# Patient Record
Sex: Male | Born: 1940 | Race: White | Hispanic: No | Marital: Married | Smoking: Never smoker
Health system: Southern US, Community
[De-identification: ages and names within clinical notes are randomized; demographics above are authoritative.]

## PROBLEM LIST (undated history)

## (undated) DIAGNOSIS — I2510 Atherosclerotic heart disease of native coronary artery without angina pectoris: Secondary | ICD-10-CM

## (undated) DIAGNOSIS — F1010 Alcohol abuse, uncomplicated: Secondary | ICD-10-CM

## (undated) DIAGNOSIS — M109 Gout, unspecified: Secondary | ICD-10-CM

## (undated) DIAGNOSIS — I480 Paroxysmal atrial fibrillation: Secondary | ICD-10-CM

## (undated) DIAGNOSIS — R413 Other amnesia: Secondary | ICD-10-CM

## (undated) DIAGNOSIS — F0390 Unspecified dementia without behavioral disturbance: Secondary | ICD-10-CM

## (undated) DIAGNOSIS — E785 Hyperlipidemia, unspecified: Secondary | ICD-10-CM

## (undated) DIAGNOSIS — I10 Essential (primary) hypertension: Secondary | ICD-10-CM

## (undated) DIAGNOSIS — N281 Cyst of kidney, acquired: Secondary | ICD-10-CM

## (undated) HISTORY — DX: Cyst of kidney, acquired: N28.1

## (undated) HISTORY — DX: Hyperlipidemia, unspecified: E78.5

## (undated) HISTORY — DX: Other amnesia: R41.3

## (undated) HISTORY — DX: Paroxysmal atrial fibrillation: I48.0

## (undated) HISTORY — PX: COLONOSCOPY: SHX174

## (undated) HISTORY — DX: Gilbert syndrome: E80.4

## (undated) HISTORY — PX: OTHER SURGICAL HISTORY: SHX169

## (undated) HISTORY — DX: Atherosclerotic heart disease of native coronary artery without angina pectoris: I25.10

## (undated) HISTORY — PX: TONSILLECTOMY: SHX5217

## (undated) HISTORY — DX: Essential (primary) hypertension: I10

---

## 2004-02-08 ENCOUNTER — Encounter: Admission: RE | Admit: 2004-02-08 | Discharge: 2004-02-08 | Payer: Self-pay | Admitting: Family Medicine

## 2004-08-23 ENCOUNTER — Encounter: Admission: RE | Admit: 2004-08-23 | Discharge: 2004-08-23 | Payer: Self-pay | Admitting: Family Medicine

## 2005-02-09 ENCOUNTER — Encounter: Admission: RE | Admit: 2005-02-09 | Discharge: 2005-02-09 | Payer: Self-pay | Admitting: Family Medicine

## 2010-12-31 ENCOUNTER — Encounter: Payer: Self-pay | Admitting: Family Medicine

## 2011-03-12 ENCOUNTER — Ambulatory Visit (INDEPENDENT_AMBULATORY_CARE_PROVIDER_SITE_OTHER): Payer: Medicare PPO | Admitting: Family Medicine

## 2011-03-12 DIAGNOSIS — M109 Gout, unspecified: Secondary | ICD-10-CM

## 2011-03-12 DIAGNOSIS — E7800 Pure hypercholesterolemia, unspecified: Secondary | ICD-10-CM

## 2011-03-19 ENCOUNTER — Institutional Professional Consult (permissible substitution): Payer: Self-pay | Admitting: Family Medicine

## 2011-04-13 ENCOUNTER — Other Ambulatory Visit: Payer: Medicare PPO

## 2011-04-20 ENCOUNTER — Other Ambulatory Visit: Payer: Medicare PPO

## 2011-04-24 ENCOUNTER — Telehealth: Payer: Self-pay | Admitting: Family Medicine

## 2011-04-24 NOTE — Telephone Encounter (Signed)
LAB APPT SCHEDULED-LM

## 2011-04-25 ENCOUNTER — Other Ambulatory Visit: Payer: Medicare PPO | Admitting: Family Medicine

## 2011-04-25 DIAGNOSIS — Z79899 Other long term (current) drug therapy: Secondary | ICD-10-CM

## 2011-04-25 DIAGNOSIS — E789 Disorder of lipoprotein metabolism, unspecified: Secondary | ICD-10-CM

## 2011-04-25 LAB — URIC ACID: Uric Acid, Serum: 5.6 mg/dL (ref 4.0–7.8)

## 2011-04-25 LAB — COMPREHENSIVE METABOLIC PANEL
ALT: 13 U/L (ref 0–53)
AST: 20 U/L (ref 0–37)
Alkaline Phosphatase: 63 U/L (ref 39–117)
BUN: 14 mg/dL (ref 6–23)
Calcium: 9.4 mg/dL (ref 8.4–10.5)
Chloride: 102 mEq/L (ref 96–112)
Creat: 1.15 mg/dL (ref 0.40–1.50)
Potassium: 3.5 mEq/L (ref 3.5–5.3)

## 2011-04-25 LAB — LIPID PANEL
HDL: 47 mg/dL (ref >39)
LDL Cholesterol: 94 mg/dL (ref 0–99)
Total CHOL/HDL Ratio: 3.3 Ratio

## 2011-04-26 ENCOUNTER — Telehealth: Payer: Self-pay | Admitting: *Deleted

## 2011-04-26 NOTE — Telephone Encounter (Signed)
Left message for patient to return my call [email or web address]

## 2011-04-30 ENCOUNTER — Telehealth: Payer: Self-pay | Admitting: Family Medicine

## 2011-04-30 NOTE — Telephone Encounter (Signed)
Pt in [place], advised of labs and rx info.  Pt will come in 3 months for labs LFT & FLP per Dr. Lynelle Doctor.  Will call in rx Uloric & Lipitor to [facility] on line [facility]

## 2011-04-30 NOTE — Telephone Encounter (Signed)
I spoke with pt advised of labs.  He wants his meds sent to [facility] on line  Lipitor 40 mg qd  3 rf   And Uloric 40mg  qd 6 rf

## 2011-04-30 NOTE — Telephone Encounter (Signed)
Message copied by Laureen Ochs on Mon Apr 30, 2011 12:16 PM ------      Message from: KNAPP, EVE      Created: Thu Apr 26, 2011  4:44 PM       Advise pt that LDL is higher than when he was on Vytorin.  Goal LDL for pt with known CAD is <70, and is now 94.  I recommend increasing Lipitor to 40mg  daily.  Re-check LFT and FLP in 3 months (272.0, v58.69).  #30 with 3 refills. Advise C-met normal, and uric acid now WNL.  Continue Uloric at 40mg , and okay to r/f x 6 months.  If he has add'l questions, can schedule OV, otherwise f/u after labs in 3 months

## 2011-05-01 NOTE — Telephone Encounter (Signed)
Refills faxed Lipitor & Uloric to [facility]

## 2011-07-03 ENCOUNTER — Telehealth: Payer: Self-pay | Admitting: Family Medicine

## 2011-07-03 MED ORDER — ATORVASTATIN CALCIUM 20 MG PO TABS: 20.0000 mg | ORAL_TABLET | Freq: Every day | ORAL | Status: DC

## 2011-07-03 MED ORDER — LISINOPRIL-HYDROCHLOROTHIAZIDE 20-12.5 MG PO TABS: 2.0000 | ORAL_TABLET | Freq: Every day | ORAL | Status: DC

## 2011-07-03 NOTE — Telephone Encounter (Signed)
Go ahead and give him a  one month supply of these 2 medications.

## 2011-07-13 ENCOUNTER — Telehealth: Payer: Self-pay | Admitting: Family Medicine

## 2011-07-13 NOTE — Telephone Encounter (Signed)
Pharmacy was called and rx was called in for Lisinopril #180 with 1 refill.  CM, LPN

## 2011-07-16 NOTE — Telephone Encounter (Signed)
Cora handled

## 2011-07-20 ENCOUNTER — Other Ambulatory Visit: Payer: Medicare PPO

## 2011-07-20 DIAGNOSIS — Z79899 Other long term (current) drug therapy: Secondary | ICD-10-CM

## 2011-07-20 DIAGNOSIS — E7800 Pure hypercholesterolemia, unspecified: Secondary | ICD-10-CM

## 2011-07-20 LAB — LIPID PANEL
Cholesterol: 143 mg/dL (ref 0–200)
HDL: 54 mg/dL (ref >39)
Total CHOL/HDL Ratio: 2.6 Ratio
Triglycerides: 58 mg/dL (ref <150)

## 2011-07-20 LAB — HEPATIC FUNCTION PANEL
Albumin: 4.1 g/dL (ref 3.5–5.2)
Alkaline Phosphatase: 54 U/L (ref 39–117)
Total Bilirubin: 1 mg/dL (ref 0.3–1.2)
Total Protein: 6.3 g/dL (ref 6.0–8.3)

## 2011-07-26 ENCOUNTER — Encounter: Payer: Self-pay | Admitting: Family Medicine

## 2011-07-30 ENCOUNTER — Encounter: Payer: Self-pay | Admitting: Family Medicine

## 2011-07-30 ENCOUNTER — Ambulatory Visit (INDEPENDENT_AMBULATORY_CARE_PROVIDER_SITE_OTHER): Payer: Medicare PPO | Admitting: Family Medicine

## 2011-07-30 VITALS — BP 134/72 | HR 68 | Ht 71.0 in | Wt 168.0 lb

## 2011-07-30 DIAGNOSIS — I10 Essential (primary) hypertension: Secondary | ICD-10-CM

## 2011-07-30 DIAGNOSIS — E7800 Pure hypercholesterolemia, unspecified: Secondary | ICD-10-CM | POA: Insufficient documentation

## 2011-07-30 DIAGNOSIS — Z79899 Other long term (current) drug therapy: Secondary | ICD-10-CM

## 2011-07-30 DIAGNOSIS — I2510 Atherosclerotic heart disease of native coronary artery without angina pectoris: Secondary | ICD-10-CM

## 2011-07-30 DIAGNOSIS — M109 Gout, unspecified: Secondary | ICD-10-CM

## 2011-07-30 LAB — BASIC METABOLIC PANEL
BUN: 15 mg/dL (ref 6–23)
CO2: 27 mEq/L (ref 19–32)
Calcium: 9.4 mg/dL (ref 8.4–10.5)
Creat: 1.1 mg/dL (ref 0.50–1.35)
Glucose, Bld: 112 mg/dL — ABNORMAL HIGH (ref 70–99)

## 2011-07-30 LAB — URIC ACID: Uric Acid, Serum: 4.4 mg/dL (ref 4.0–7.8)

## 2011-07-30 MED ORDER — ATORVASTATIN CALCIUM 40 MG PO TABS: 40.0000 mg | ORAL_TABLET | Freq: Every day | ORAL | Status: DC

## 2011-07-30 NOTE — Patient Instructions (Signed)
I do not recommend changing to the alternative medication you brought info about; however, if you would like to try this, we need to check a uric acid level about a month after you change.  If you have a gout flare while taking it, then we don't need to check the uric acid level, just resume the uloric once the flare has improved.

## 2011-07-30 NOTE — Progress Notes (Signed)
Patient presents for f/u labs.  Unfortunately, only lipids and LFT's were ordered, when, per notes from last visit, he was supposed to have c-met, lipids and uric acid level  Hyperlipidemia follow-up:  Patient is reportedly following a low-fat, low cholesterol diet.  Eating better than he ever has.  Compliant with medications and denies medication side effects--tolerated increase of Lipitor dose from 20 to 40mg  without problems.  Gout--No problems with gout flares in his feet since being on the uloric.  Had some knee pain and swelling a week or two ago, improving now.  Gave up beer over the last week, and improving.  Tolerating the Uloric without side effects.  Brings in info on [place], considering taking this in place of Uloric--he thinks it sounds like there may be more overall benefit to it.  Cost is about the same.  HTN--BP at home runs 130's/60's (low-mid 130's).  Was out of lisinopril for about 10 days, BP went up into the 140's.  Has been back on lisinopril for about 10 days.  Past Medical History  Diagnosis Date  . CAD (coronary artery disease)     Dr. Eldridge Dace.  atherectomy from RCA and LAD '91  . HTN (hypertension)   . Renal cyst     left  . Gilbert syndrome     elevated indirect bilirubin  . Wheezing     with beta blockers (per Dr. Lindell Spar notes)  . Inguinal hernia     R>L, easily reducible  . Wheezing     WITH B-BLOCKERS   Past Surgical History  Procedure Date  . Tonsillectomy age 77  . Cardiac atherectomy '91    to LAD and RCA  . Prk '96    (refractive eye surgery)  . Colonoscopy 03/2009   History   Social History  . Marital Status: Married    Spouse Name: N/A    Number of Children: N/A  . Years of Education: N/A   Occupational History  . Not on file.   Social History Main Topics  . Smoking status: Never Smoker   . Smokeless tobacco: Not on file  . Alcohol Use: Yes     on occassion; occasional glass of wine with dinner, bourbon and ginger ale 2x/week.   a beer at baseball game in the summer  . Drug Use: No  . Sexually Active: Not on file   Other Topics Concern  . Not on file   Social History Narrative  . No narrative on file   ROS:  Denies headaches, chest pain, dizziness, palpitations, edema.  See HPI.  Denies GI complaints, skin concerns or other problems  PHYSICAL EXAM: BP 134/72  Pulse 68  Ht 5\' 11"  (1.803 m)  Wt 168 lb (76.204 kg)  BMI 23.43 kg/m2 Well developed, pleasant male in no distress Neck: no lymphadenopathy or thyromegaly Heart: regular rate and rhythm without murmur Lungs: clear bilaterally Abdomen: soft, no organomegaly or mass, nontender Extremities: no clubbing, cyanosis or edema. 2+ pulses Skin: without rashes or lesions Psych: normal mood, affect, hygiene, grooming, speech and eye contact  ASSESSMENT/PLAN: 1. Pure hypercholesterolemia    2. Gout  Uric acid  3. CAD (coronary artery disease)    4. Encounter for long-term (current) use of other medications  Basic metabolic panel   Lipids:  Still not quite at goal of LDL<70, but improved.  Continue low cholesterol diet. Change from 20mg  BID of Lipitor to the 40mg  tablet HTN: adequately controlled per home numbers Gout: Consider changing to 40  mg of lisinopril (and getting rid of the HCTZ) if uric acid is elevated on today's labs (although would need to monitor BP closely, might need additional BP meds that won't increase uric acid level).  I do not recommend him changing from Uloric to OTC medication, however if he wants to, then he needs to have repeat uric acid levels in a month. If he has a recurrent gout flare prior to repeating levels, then he should treat acute flare, then resume Uloric (no need to repeat uric acid)  F/u 6 months, sooner prn.  Labs prior

## 2011-07-31 ENCOUNTER — Encounter: Payer: Self-pay | Admitting: Family Medicine

## 2011-10-15 ENCOUNTER — Other Ambulatory Visit: Payer: Self-pay | Admitting: Family Medicine

## 2011-10-15 MED ORDER — FEBUXOSTAT 40 MG PO TABS: 40.0000 mg | ORAL_TABLET | Freq: Every day | ORAL | Status: DC

## 2011-10-15 NOTE — Telephone Encounter (Signed)
I don't believe we can [facility]rx to [facility].  Please phone in (or fax) refill as ordered

## 2011-12-10 ENCOUNTER — Telehealth: Payer: Self-pay | Admitting: *Deleted

## 2011-12-10 DIAGNOSIS — M2550 Pain in unspecified joint: Secondary | ICD-10-CM

## 2011-12-10 NOTE — Telephone Encounter (Signed)
I added sed rate to future orders (already had uric acid level for his known gout ordered).  Clearly, he doesn't need to wait until then if having ongoing pain.

## 2011-12-10 NOTE — Telephone Encounter (Signed)
Patient called and stated that he has been dealing with an elbow "flare up." Patient stated that his elbow feels "spongy' and he is having muscle pain up toward shoulder x 3 weeks. Been taking ibuprofen for the pain and is dealing with it. I offered him and appt and he simply wanted me to ask you if you could add some labs do his labwork already scheduled for 01/21/12. He is requesting labs be added for inflammation. Thanks.

## 2011-12-13 ENCOUNTER — Telehealth: Payer: Self-pay | Admitting: *Deleted

## 2011-12-13 NOTE — Telephone Encounter (Signed)
Spoke with patient to let him know that sedrate was added to labs. Also let him know that he should come in sooner if ongoing pain continues. He said that he has pretty well conquered the pain by taking naproxen and he will call if anything worsens.

## 2012-01-15 ENCOUNTER — Encounter: Payer: Self-pay | Admitting: Internal Medicine

## 2012-01-21 ENCOUNTER — Other Ambulatory Visit: Payer: Medicare Other

## 2012-01-21 DIAGNOSIS — Z79899 Other long term (current) drug therapy: Secondary | ICD-10-CM

## 2012-01-21 DIAGNOSIS — E7800 Pure hypercholesterolemia, unspecified: Secondary | ICD-10-CM

## 2012-01-21 DIAGNOSIS — M2550 Pain in unspecified joint: Secondary | ICD-10-CM

## 2012-01-21 DIAGNOSIS — I10 Essential (primary) hypertension: Secondary | ICD-10-CM

## 2012-01-21 LAB — LIPID PANEL
LDL Cholesterol: 97 mg/dL (ref 0–99)
Triglycerides: 123 mg/dL (ref <150)
VLDL: 25 mg/dL (ref 0–40)

## 2012-01-21 LAB — COMPREHENSIVE METABOLIC PANEL
ALT: 20 U/L (ref 0–53)
AST: 25 U/L (ref 0–37)
CO2: 31 mEq/L (ref 19–32)
Calcium: 9.5 mg/dL (ref 8.4–10.5)
Chloride: 100 mEq/L (ref 96–112)
Creat: 1.24 mg/dL (ref 0.50–1.35)
Sodium: 139 mEq/L (ref 135–145)
Total Protein: 6.7 g/dL (ref 6.0–8.3)

## 2012-01-28 ENCOUNTER — Encounter: Payer: Self-pay | Admitting: Family Medicine

## 2012-01-28 ENCOUNTER — Ambulatory Visit (INDEPENDENT_AMBULATORY_CARE_PROVIDER_SITE_OTHER): Payer: Medicare Other | Admitting: Family Medicine

## 2012-01-28 VITALS — BP 120/70 | HR 72 | Ht 71.0 in | Wt 174.0 lb

## 2012-01-28 DIAGNOSIS — M109 Gout, unspecified: Secondary | ICD-10-CM

## 2012-01-28 DIAGNOSIS — M7021 Olecranon bursitis, right elbow: Secondary | ICD-10-CM

## 2012-01-28 DIAGNOSIS — E7800 Pure hypercholesterolemia, unspecified: Secondary | ICD-10-CM

## 2012-01-28 DIAGNOSIS — M7020 Olecranon bursitis, unspecified elbow: Secondary | ICD-10-CM

## 2012-01-28 DIAGNOSIS — I10 Essential (primary) hypertension: Secondary | ICD-10-CM

## 2012-01-28 DIAGNOSIS — I2510 Atherosclerotic heart disease of native coronary artery without angina pectoris: Secondary | ICD-10-CM

## 2012-01-28 NOTE — Progress Notes (Signed)
Patient presents for med check.  Had labs done prior to his appointment.  Hyperlipidemia follow-up:  Patient is reportedly following a low-fat, low cholesterol diet.  Denies any change in diet from last visit.  Compliant with medications and denies medication side effects.  Previously had done quite well with Vytorin, but changed due to cost of medication ($267 for 3 months); atorvastatin is only $5/month. Denies myalgias.    Hypertension follow-up:  Blood pressures elsewhere are high 120's-130/63-80.  Denies dizziness, headaches, chest pain, palpitations, edema.  Denies side effects of medications.  CAD:  Followed by Dr. Eldridge Dace.  Stable and doing well.  He is able to run 2 miles without any problems.  Previously didn't tolerate beta blockers (thinks his feet got cold).  Last stress test was within the year, and was normal.  Sees him again in August.  Gout follow-up: Denies any gout flares since last visit.  Remains on the Uloric without side effects.  Eats 5 gin-soaked yellow raisins daily, as he has heard this is good for helping with gout.  Noticed swelling at right elbow about a month or two ago.  Fluid has improved, but still hasn't completely resolved.  Denies any pain at the elbow.  Past Medical History  Diagnosis Date  . CAD (coronary artery disease)     Dr. Eldridge Dace.  atherectomy from RCA and LAD '91  . HTN (hypertension)   . Renal cyst     left  . Gilbert syndrome     elevated indirect bilirubin  . Wheezing     with beta blockers (per Dr. Lindell Spar notes)  . Inguinal hernia     R>L, easily reducible  . Wheezing     WITH B-BLOCKERS    Past Surgical History  Procedure Date  . Tonsillectomy age 61  . Cardiac atherectomy '91    to LAD and RCA  . Prk '96    (refractive eye surgery)  . Colonoscopy 03/2009    History   Social History  . Marital Status: Married    Spouse Name: N/A    Number of Children: N/A  . Years of Education: N/A   Occupational History  . Not on  file.   Social History Main Topics  . Smoking status: Never Smoker   . Smokeless tobacco: Not on file  . Alcohol Use: Yes     on occassion; occasional glass of wine with dinner, bourbon and ginger ale 2x/week.  a beer at baseball game in the summer  . Drug Use: No  . Sexually Active: Not on file   Other Topics Concern  . Not on file   Social History Narrative  . No narrative on file     Current outpatient prescriptions:atorvastatin (LIPITOR) 40 MG tablet, Take 1 tablet (40 mg total) by mouth daily., Disp: 30 tablet, Rfl: 5;  Calcium Carbonate-Vit D-Min (CALCIUM 600+D PLUS MINERALS) 600-400 MG-UNIT TABS, Take 1 tablet by mouth daily.  , Disp: , Rfl: ;  Cholecalciferol (VITAMIN D) 2000 UNITS tablet, Take 2,000 Units by mouth daily.  , Disp: , Rfl: ;  febuxostat (ULORIC) 40 MG tablet, Take 40 mg by mouth daily., Disp: , Rfl:  fish oil-omega-3 fatty acids 1000 MG capsule, Take 1 g by mouth daily.  , Disp: , Rfl: ;  Glucosamine 500 MG CAPS, Take 3 capsules by mouth daily.  , Disp: , Rfl: ;  lisinopril-hydrochlorothiazide (PRINZIDE,ZESTORETIC) 20-12.5 MG per tablet, Take 2 tablets by mouth daily., Disp: 180 tablet, Rfl: 1;  Misc  Natural Products (TART CHERRY ADVANCED PO), Take 1 tablet by mouth daily., Disp: , Rfl:  Multiple Vitamins-Minerals (CENTRUM SILVER PO), Take 1 tablet by mouth daily.  , Disp: , Rfl: ;  saw palmetto 160 MG capsule, Take 160 mg by mouth daily. , Disp: , Rfl: ;  selenium 50 MCG TABS, Take 50 mcg by mouth daily.  , Disp: , Rfl: ;  Zinc Sulfate (ZINC 15 PO), Take 1 tablet by mouth daily.  , Disp: , Rfl: ;  DISCONTD: atorvastatin (LIPITOR) 20 MG tablet, Take 1 tablet (20 mg total) by mouth daily., Disp: 90 tablet, Rfl: 2  No Known Allergies  ROS:  Denies fevers, URI symptoms, cough, shortness of breath, chest pain, edema, skin rashes or lesions.  +R elbow swelling as per HPI.  Denies any urinary complaints or GI complaints. Moods are good  PHYSICAL EXAM: BP 120/70  Pulse 72   Ht 5\' 11"  (1.803 m)  Wt 174 lb (78.926 kg)  BMI 24.27 kg/m2 Well developed, pleasant male in no distress  Neck: no lymphadenopathy or thyromegaly or carotid bruit Heart: regular rate and rhythm without murmur  Lungs: clear bilaterally  Abdomen: soft, no organomegaly or mass, nontender  Extremities: no clubbing, cyanosis or edema. 2+ pulses  Skin: without rashes or lesions  Psych: normal mood, affect, hygiene, grooming, speech and eye contact    Chemistry      Component Value Date/Time   NA 139 01/21/2012 0825   K 3.7 01/21/2012 0825   CL 100 01/21/2012 0825   CO2 31 01/21/2012 0825   BUN 14 01/21/2012 0825   CREATININE 1.24 01/21/2012 0825      Component Value Date/Time   CALCIUM 9.5 01/21/2012 0825   ALKPHOS 54 01/21/2012 0825   AST 25 01/21/2012 0825   ALT 20 01/21/2012 0825   BILITOT 1.1 01/21/2012 0825     Lab Results  Component Value Date   CHOL 175 01/21/2012   CHOL 143 07/20/2011   CHOL 154 04/25/2011   Lab Results  Component Value Date   HDL 53 01/21/2012   HDL 54 1/61/0960   HDL 47 4/54/0981   Lab Results  Component Value Date   LDLCALC 97 01/21/2012   LDLCALC 77 07/20/2011   LDLCALC 94 04/25/2011   Lab Results  Component Value Date   TRIG 123 01/21/2012   TRIG 58 07/20/2011   TRIG 67 04/25/2011   Lab Results  Component Value Date   CHOLHDL 3.3 01/21/2012   CHOLHDL 2.6 07/20/2011   CHOLHDL 3.3 04/25/2011   No results found for this basename: LDLDIRECT   Uric acid 5.5  ASSESSMENT/PLAN: 1. Pure hypercholesterolemia    higher than previous.  Not at goal of LDL<70  2. Essential hypertension, benign    controlled  3. Gout    controlled  4. CAD (coronary artery disease)    stable  5. Olecranon bursitis of right elbow    improved, some residual swelling    Hyperlipidemia--Increase Lipitor to 80 mg (will double check that bottle at home is in fact 40mg , and call)  Gout--well controlled.  Should have a 90 day refill left at pharmacy.  Discussed possibility of  changing HCTZ and stopping uloric versus continuing current regimen which is working well.  Continue current regimen   F/u with fasting lab visit in 3 months to recheck lipids, lft's. 6 months--CPE/med check

## 2012-01-28 NOTE — Patient Instructions (Signed)
Please double check your atorvastatin bottle at home--if for some reason it is only 20mg , then increase back to 40 mg (and call for refill of 40mg  tablet).  If you really are taking the 40mg , then we need to increase to 80mg  (and call requesting rx for 80mg ).  Olecranon bursitis--very mild, no need for any treatment.  If it should become more swollen and painful, anti-inflammatories (ie advil) are helpful   Bursitis, Olecranon Bursitis is a swelling and soreness (inflammation) of a fluid-filled sac (bursa) that overlies and protects a joint. Yours is over the elbow and the name for this is Olecranon bursitis. Other common places to get this are the shoulders, hips and knees. CAUSES Bursitis can be caused by injury, overuse of the joint, arthritis, or infection.  SYMPTOMS   Tenderness, swelling, warmth, or redness over the elbow.   Pain with moving the elbow. This is greater with bending the elbow.   There may be a squeaking when the bursa is rubbed or moved.   The bursa may increase in size without pain or discomfort.   You may develop a fever with increasing pain and swelling if the bursa becomes infected.  HOME CARE INSTRUCTIONS   Apply ice to the affected area for 15 to 20 minutes each hour while awake for 2 days. Put the ice in a plastic bag and place a towel between the bag of ice and your skin.   When resting, elevate your elbow above the level of your heart. This helps reduce swelling.   Rest the injured joint as much as possible. Continue to put the joint through a full range of motion 4 times per day. When the pain lessens, begin normal slow movements and usual activities.   Only take over-the-counter or prescription medicines for pain, discomfort, or fever as directed by your caregiver.   If conservative treatment does not work, your caregiver may decide to drain the bursa and inject cortisone. This speeds up the healing process.   Reduce your intake of milk and related  dairy products. They may make your condition worse.  SEEK IMMEDIATE MEDICAL CARE IF:   Your pain increases even during treatment.   You develop an oral temperature above 102 F (38.9 C).   You have heat and inflammation over the involved bursa and elbow.   There is a red line that goes up your arm.   You develop pain with movement of your elbow.  MAKE SURE YOU:   Understand these instructions.   Will watch your condition.   Will get help right away if you are not doing well or get worse.  Document Released: 12/26/2006 Document Revised: 08/08/2011 Document Reviewed: 11/11/2007 [facility] Patient Information 2012 [place], [place].

## 2012-02-28 ENCOUNTER — Other Ambulatory Visit: Payer: Self-pay | Admitting: Internal Medicine

## 2012-02-28 DIAGNOSIS — E7800 Pure hypercholesterolemia, unspecified: Secondary | ICD-10-CM

## 2012-02-28 DIAGNOSIS — I10 Essential (primary) hypertension: Secondary | ICD-10-CM

## 2012-02-28 MED ORDER — LISINOPRIL-HYDROCHLOROTHIAZIDE 20-12.5 MG PO TABS: 2.0000 | ORAL_TABLET | Freq: Every day | ORAL | Status: DC

## 2012-02-28 MED ORDER — FEBUXOSTAT 40 MG PO TABS: 40.0000 mg | ORAL_TABLET | Freq: Every day | ORAL | Status: DC

## 2012-02-28 NOTE — Telephone Encounter (Signed)
Advise pt I sent rx's to [facility] on [address]. Please send Vytorin Rx--supposed to go to [facility]'s online pharmacy--is that [facility]?? (that's where we previously sent rx's?) I recommend returning for lab only visit for lft's and lipids in 2-3 months after changing to Vytorin to make sure we are meeting goals (LDL<70) with this medication.

## 2012-02-29 ENCOUNTER — Telehealth: Payer: Self-pay | Admitting: Family Medicine

## 2012-02-29 NOTE — Telephone Encounter (Signed)
Patient wants Vytorin Rx (90 day supply) sent to mail order pharmacy    [facility]

## 2012-03-03 ENCOUNTER — Other Ambulatory Visit: Payer: Self-pay | Admitting: *Deleted

## 2012-03-03 ENCOUNTER — Telehealth: Payer: Self-pay | Admitting: *Deleted

## 2012-03-03 DIAGNOSIS — I10 Essential (primary) hypertension: Secondary | ICD-10-CM

## 2012-03-03 MED ORDER — EZETIMIBE-SIMVASTATIN 10-80 MG PO TABS: 1.0000 | ORAL_TABLET | Freq: Every day | ORAL | Status: DC

## 2012-03-03 MED ORDER — LISINOPRIL-HYDROCHLOROTHIAZIDE 20-12.5 MG PO TABS: 2.0000 | ORAL_TABLET | Freq: Every day | ORAL | Status: DC

## 2012-03-03 NOTE — Telephone Encounter (Signed)
This says that the lisinopril HCT was PRINT--not normal.  Not sure if I missed that when I quickly did it last week. Please call in to [facility] if it didn't go through.  Thanks

## 2012-03-03 NOTE — Telephone Encounter (Signed)
Spoke with patient and sent rx for Vytorin 80mg  to [facility].

## 2012-03-10 ENCOUNTER — Telehealth: Payer: Self-pay | Admitting: Family Medicine

## 2012-03-10 NOTE — Telephone Encounter (Signed)
Patient called and requested a call from you. I called him to see if I could help. He stated that within the last week he has experienced some lightheadedness that did not last very long or cause him any impairment. His bp readings over the weekend have been about 163/83, he states he is trying to be proactive. He said he is scheduled for his yearly appt with Dr Everette Rank in May. Do you think he should try to get in sooner?

## 2012-03-10 NOTE — Telephone Encounter (Signed)
Felt a little winded while doing yardwork.  Didn't last long.  Started checking BP's more often, running a little high (160's/80's, pulse 70's). Denies dizziness, chest pain, allergy symptoms, although patient hadn't really been paying attention, and thinks maybe allergies could be a factor. Had a few episodes, all of which were very short-lived.  He will try taking Claritin, paying attention as to whether or not there are any allergy symptoms.  Continue to monitor BP, and if persistently elevated, will need meds adjusted.  We discussed that the most concerning thing for exertional dyspnea would be heart disease.  He has a h/o CAD and is followed by Dr. Eldridge Dace.  His next appt is late May.  Advised to return here if BP's remain elevated, or ongoing symptoms, and that he may need to f/u with Dr. Seth Bake sooner than his scheduled appt.  All of his questions/concerns were answered.

## 2012-03-10 NOTE — Telephone Encounter (Signed)
Pt called requesting call from Dr. Lynelle Doctor to discuss blood pressure questions

## 2012-03-12 ENCOUNTER — Telehealth: Payer: Self-pay | Admitting: *Deleted

## 2012-03-12 NOTE — Telephone Encounter (Signed)
I recommend changing him to plain lisinopril 20 mg (not the 20-12.5 with HCTZ).  This is cutting out the diuretic which raises the uric acid levels.  Have him monitor his blood pressure at home, and if BPs get to be >140/90, then will need to raise the lisinopril dose.  Since his BP's have been good, he might do just fine without the HCTZ.  He should return in 3 months for fasting lipids and LFT's since we are restarting him back on Vytorin, and also check a uric acid at that time.  (if we end up increasing his lisinopril dose, then will also need b-met done then).  F/u in 3 months after labs, sooner prn.  Obviously, if he has gout flare in those 3 months, then we will need to start him on allopurinol and do labs sooner.

## 2012-03-12 NOTE — Telephone Encounter (Signed)
Error

## 2012-03-12 NOTE — Telephone Encounter (Signed)
Spoke with patient and he liked the idea of changing his blood pressure medication (by taking hctz away) and stopping the Uloric completely. What do we need to do to get this started? Appt to change med?

## 2012-03-13 ENCOUNTER — Other Ambulatory Visit: Payer: Self-pay | Admitting: *Deleted

## 2012-03-13 ENCOUNTER — Telehealth: Payer: Self-pay | Admitting: Family Medicine

## 2012-03-13 DIAGNOSIS — I10 Essential (primary) hypertension: Secondary | ICD-10-CM

## 2012-03-13 DIAGNOSIS — E7800 Pure hypercholesterolemia, unspecified: Secondary | ICD-10-CM

## 2012-03-13 DIAGNOSIS — Z79899 Other long term (current) drug therapy: Secondary | ICD-10-CM

## 2012-03-13 MED ORDER — LISINOPRIL 20 MG PO TABS: 20.0000 mg | ORAL_TABLET | Freq: Every day | ORAL | Status: DC

## 2012-03-13 NOTE — Telephone Encounter (Signed)
Spoke with patient and he will begin taking lisinopril 20mg  daily(called into [facility]) he will d/c Uloric and monitor his bp readings, if his numbers rise above 140/90 he is to call and Dr.Knapp will increase his lisinopril dose. He is scheduled for 06/13/12 @ 8:30 for lipids, LFT's and uric acid-orders in system.

## 2012-03-13 NOTE — Telephone Encounter (Signed)
Called patient back twice since he last called me and left him a message both times. If he doesn't call me back today by 5pm, I will call him first thing Monday am.

## 2012-04-08 ENCOUNTER — Telehealth: Payer: Self-pay | Admitting: Family Medicine

## 2012-04-08 NOTE — Telephone Encounter (Signed)
LM

## 2012-04-28 ENCOUNTER — Other Ambulatory Visit: Payer: Medicare Other

## 2012-04-28 DIAGNOSIS — E7800 Pure hypercholesterolemia, unspecified: Secondary | ICD-10-CM

## 2012-04-28 DIAGNOSIS — Z79899 Other long term (current) drug therapy: Secondary | ICD-10-CM

## 2012-04-28 LAB — LIPID PANEL
HDL: 62 mg/dL (ref >39)
LDL Cholesterol: 45 mg/dL (ref 0–99)
VLDL: 11 mg/dL (ref 0–40)

## 2012-04-28 LAB — HEPATIC FUNCTION PANEL
Albumin: 4 g/dL (ref 3.5–5.2)
Alkaline Phosphatase: 46 U/L (ref 39–117)
Total Bilirubin: 1.2 mg/dL (ref 0.3–1.2)

## 2012-04-28 LAB — URIC ACID: Uric Acid, Serum: 4.1 mg/dL (ref 4.0–7.8)

## 2012-05-06 ENCOUNTER — Telehealth: Payer: Self-pay | Admitting: Family Medicine

## 2012-05-06 NOTE — Telephone Encounter (Signed)
Please call patient and see what his blood pressures are running.  He is supposed to be on 40mg  of HCTZ (taking TWO 20mg  tablets daily)--it doesn't look like rx was done correctly--states only 1 qd (I believe that he previously took TWO of the 20/12.5 of the lisinopril HCT prior to changing).  If his BP's are running high, and he is only taking 20mg , then increase to 40mg  and monitor BP. If he has been taking 40mg  of lisinopril, and BP's are running high, then go back to the previous med (2 tabs of 20/12.5), and continue on Uloric, as this regimen has worked well for him

## 2012-05-07 ENCOUNTER — Other Ambulatory Visit: Payer: Self-pay | Admitting: *Deleted

## 2012-05-07 DIAGNOSIS — I10 Essential (primary) hypertension: Secondary | ICD-10-CM

## 2012-05-07 MED ORDER — LISINOPRIL 20 MG PO TABS: 40.0000 mg | ORAL_TABLET | Freq: Every day | ORAL | Status: DC

## 2012-05-07 NOTE — Telephone Encounter (Signed)
Spoke with patient and he has only been taking 20mg  of plain lisinopril with his numbers ranging from the 190's/93- 150/88. He will begin taking 40mg  plain lisinopril and I did call into [facility] for him. He will monitor bp numbers and call us if numbers are not within normal range and Dr.Knapp will switch him back to his original regimen of lisinopril hct and Uloric as this worked well for him.

## 2012-05-26 ENCOUNTER — Telehealth: Payer: Self-pay | Admitting: *Deleted

## 2012-05-26 NOTE — Telephone Encounter (Signed)
Patient called and stated that since he started taking the TWO 20mg  lisinopril his bp's have been ranging in the 150's/80's. In addition over the past few weeks he has been experiencing some short periods of SOB. Had again this am-he took 2 aspirin with some water and after 5 minutes it subsided. I offered him appt today but he stated that he wanted to send a message back to you first asking what you recommend. Thanks.

## 2012-05-26 NOTE — Telephone Encounter (Signed)
Spoke with patient and he will be coming in [facility] 05/28/12 @ 8:30am.

## 2012-05-26 NOTE — Telephone Encounter (Signed)
OV is recommended to f/u on his BP (preferably after he's been on the 40mg  of lisinopril for at least 2 weeks). Can be seen sooner for SOB (even if <2 weeks on higher meds).  Agree with recommendation for OV

## 2012-05-28 ENCOUNTER — Encounter: Payer: Self-pay | Admitting: Family Medicine

## 2012-05-28 ENCOUNTER — Ambulatory Visit (INDEPENDENT_AMBULATORY_CARE_PROVIDER_SITE_OTHER): Payer: Medicare Other | Admitting: Family Medicine

## 2012-05-28 VITALS — BP 142/64 | HR 80 | Ht 71.0 in | Wt 172.0 lb

## 2012-05-28 DIAGNOSIS — I2510 Atherosclerotic heart disease of native coronary artery without angina pectoris: Secondary | ICD-10-CM

## 2012-05-28 DIAGNOSIS — I10 Essential (primary) hypertension: Secondary | ICD-10-CM

## 2012-05-28 DIAGNOSIS — E7800 Pure hypercholesterolemia, unspecified: Secondary | ICD-10-CM

## 2012-05-28 DIAGNOSIS — M109 Gout, unspecified: Secondary | ICD-10-CM

## 2012-05-28 MED ORDER — LISINOPRIL-HYDROCHLOROTHIAZIDE 20-12.5 MG PO TABS: 2.0000 | ORAL_TABLET | Freq: Every day | ORAL | Status: DC

## 2012-05-28 NOTE — Patient Instructions (Signed)
Stop the plain lisinopril.  Go back to the lisinopril HCTZ combination.  Continue the Uloric.  Low purine diet is recommended (lowers uric acid to decrease gout flares).  Talk with Dr. Eldridge Dace about ?need for a beta blocker (other than atenolol which you didn't tolerate).

## 2012-05-28 NOTE — Progress Notes (Signed)
Chief Complaint  Patient presents with  . Follow-up    bp follow up.   HPI:  HTN:  BP's at home have been running 140-170/70-90 at home on current regimen of lisinopril 40mg  tablet.  He was changed from lisinopril HCT 20/12.5 2 tablets daily to plain lisinopril due to trying to lower uric acid (so that uloric could be eliminated if uric acid levels were low enough).  However, he has noted that BP's haven't been well controlled on this current regimen, and he also reports that he never stopped the Uloric.  Monday--while walking at work (not strenuous) he developed some shortness of breath and R shoulder pain.  He took 2 baby aspirin and chewed them, and symptoms resolved.  He did not rest, continued walking/working.  He has been doing a lot of vigorous yardwork.  He had no dyspnea with this strenuous activity.  He had called here, as well as called Dr. Eldridge Dace, and he is set up for a stress test on Friday. Previously intolerant of beta blocker (atenolol) due to it causing his feet to feel very cold. Also reportedly had wheezing (per chart) on beta blockers.  Hyperlipidemia:  Back on Vytorin, and last labs showed it effective Lab Results  Component Value Date   CHOL 118 04/28/2012   HDL 62 04/28/2012   LDLCALC 45 04/28/2012   TRIG 56 04/28/2012   CHOLHDL 1.9 04/28/2012   Past Medical History  Diagnosis Date  . CAD (coronary artery disease)     Dr. Eldridge Dace.  atherectomy from RCA and LAD '91  . HTN (hypertension)   . Renal cyst     left  . Gilbert syndrome     elevated indirect bilirubin  . Wheezing     with beta blockers (per Dr. Lindell Spar notes)  . Inguinal hernia     R>L, easily reducible  . Wheezing     WITH B-BLOCKERS   Past Surgical History  Procedure Date  . Tonsillectomy age 19  . Cardiac atherectomy '91    to LAD and RCA  . Prk '96    (refractive eye surgery)  . Colonoscopy 03/2009   ROS:  Denies fevers, URI symptoms, cough.  Denies chest pain.  Dyspnea and R shoulder  pain 2 days ago, short-lived, resolved.  Denies GI complaints, edema, joint pains, skin rash, depression, or other concerns.  Recent increase in beer intake due it being summer/baseball season.  PHYSICAL EXAM: BP 142/64  Pulse 80  Ht 5\' 11"  (1.803 m)  Wt 172 lb (78.019 kg)  BMI 23.99 kg/m2 162/74 pt's machine LA, pulse 67 140/68 by MD RA 144/68 by MD LA Well developed male, accompanied by his wife, in no distress Neck: no lymphadenopathy or mass Heart: regular rate and rhythm without murmur Lungs: clear bilaterally Abdomen: soft, nontender Extremities: no edema Skin: no rash Psych: normal mood, affect, hygiene, grooming  ASSESSMENT/PLAN:  1. Essential hypertension, benign  lisinopril-hydrochlorothiazide (PRINZIDE,ZESTORETIC) 20-12.5 MG per tablet  2. Gout    3. Pure hypercholesterolemia    4. CAD (coronary artery disease)     Change back to lisinopril HCTZ and continue Uloric.  If, in future, when diet better and if he desires to try and cut out the uloric, we now know that BP isn't controlled adequately on lisinopril 40mg  alone, and would need a replacement for the HCTZ (ie beta blocker or other medication, using caution with beta blocker due to h/o wheezing). Recommended discussing beta blocker (different from atenolol)  with Dr. Eldridge Dace  Spent at least over 20 minutes discussing treatment options and rationale behind choices.  Given increased alcohol intake over the summer, and the fact that he never stopped the Uloric, recommended continuing the uloric and changing back to lisinopril HCT.  This regimen was very effective for him in the past. Reviewed low purine diet, and recommended decreased beer intake.  Hyperlipidemia--adequately controlled, at goal, on Vytorin.  R/s OV from August to November;  Lipid and c-met prior to visit (only needs uric acid if no longer on uloric, which isn't being stopped at this point).

## 2012-06-04 ENCOUNTER — Other Ambulatory Visit: Payer: Self-pay | Admitting: *Deleted

## 2012-06-04 DIAGNOSIS — E7800 Pure hypercholesterolemia, unspecified: Secondary | ICD-10-CM

## 2012-06-04 MED ORDER — EZETIMIBE-SIMVASTATIN 10-80 MG PO TABS: 1.0000 | ORAL_TABLET | Freq: Every day | ORAL | Status: DC

## 2012-06-13 ENCOUNTER — Other Ambulatory Visit: Payer: Medicare Other

## 2012-06-17 ENCOUNTER — Telehealth: Payer: Self-pay | Admitting: Internal Medicine

## 2012-06-17 DIAGNOSIS — E7800 Pure hypercholesterolemia, unspecified: Secondary | ICD-10-CM

## 2012-06-17 MED ORDER — EZETIMIBE-SIMVASTATIN 10-80 MG PO TABS: 1.0000 | ORAL_TABLET | Freq: Every day | ORAL | Status: DC

## 2012-06-17 NOTE — Telephone Encounter (Signed)
Pt called stating that he never got the refill for vytorin 10-80mg  that was sent to [facility]. So i resent it to the pharmacy again

## 2012-06-18 ENCOUNTER — Other Ambulatory Visit: Payer: Self-pay | Admitting: *Deleted

## 2012-06-18 DIAGNOSIS — E7800 Pure hypercholesterolemia, unspecified: Secondary | ICD-10-CM

## 2012-06-20 ENCOUNTER — Other Ambulatory Visit: Payer: Medicare Other

## 2012-07-15 ENCOUNTER — Other Ambulatory Visit: Payer: Medicare Other

## 2012-07-25 ENCOUNTER — Telehealth: Payer: Self-pay | Admitting: Family Medicine

## 2012-07-25 NOTE — Telephone Encounter (Signed)
Dr. Eldridge Dace office was called and most resent stress test was requested and they will be faxing over today. I will place in your office when it is received.  Spoke to pt and he informed me that he had contacted Dr. Eldridge Dace and that he had requested that this pt stop Vytorin for one week and see if symptoms persist. I also informed pt of the message I received from you. Pt did tell me that his last stress test was "great".  Pt informed me he would be stopping this med per Dr. Eldridge Dace instructions. Pt also stated that he would be in touch to let you know how it goes. Pt also stressed again that these episodes are mild and random in nature. When I informed pt (when original message was taken)  that you were not in but checking messages he felt he should call Dr. Eldridge Dace just to  "cover all bases".

## 2012-07-25 NOTE — Telephone Encounter (Signed)
He has been on Vytorin for many years--I think that if he were going to have a side effect, it wouldn't just be starting now (I'm aware that he had been changed to Lipitor, and recently changed back to vytorin, but has been at least a couple of months that he is back on it, I believe). I know that he is under the care of Dr. Eldridge Dace, and I believe recently had a stress test.  I don't think I ever received the results. Please get results.  If shortness of breath is with exertion, and stress test normal, then needs OV to look at potential pulmonary etiology (may need CXR, poss PFT's etc) in the next 1-2 weeks  If he truly thinks it is the vytorin, he can stop the vytorin for a week to see if improves, and then try another statin in its place--can be started in 1-2 weeks, if he feels much better off the vytorin.  (Would have to go with Crestor 40 though, given dose of his Vytorin).  Please get stress test results from Dr. Eldridge Dace, so that I can be assured that DOE isn't related to his CAD.  Thanks

## 2012-07-25 NOTE — Telephone Encounter (Signed)
Pt called and stated he has some concerns with Vytorin. Pt states he has had some episodes with mild shortness of breath.  I asked about that and he stated it was very mild but he did notice that shortness of breath was one of the side effects of Vytorin. Pt was wondering how you felt about that.  When I looked at your schedule you have very little time slots available all next week. I did offer pt an appt with another provider but pt states he would rather have you handle. How should I proceed? Please inform pt (or me) if this requires an office visit or can be handled over the phone. FYI you only have a handful of 15 min appts left all next week.

## 2012-07-25 NOTE — Telephone Encounter (Signed)
Noted  

## 2012-07-28 ENCOUNTER — Encounter: Payer: Medicare Other | Admitting: Family Medicine

## 2012-07-31 ENCOUNTER — Encounter: Payer: Self-pay | Admitting: Family Medicine

## 2012-09-01 ENCOUNTER — Telehealth: Payer: Self-pay | Admitting: Family Medicine

## 2012-09-01 MED ORDER — FEBUXOSTAT 40 MG PO TABS: 40.0000 mg | ORAL_TABLET | Freq: Every day | ORAL | Status: DC

## 2012-09-01 NOTE — Telephone Encounter (Signed)
Okay for 90 days

## 2012-09-01 NOTE — Telephone Encounter (Signed)
Done

## 2012-09-19 ENCOUNTER — Encounter: Payer: Self-pay | Admitting: Internal Medicine

## 2012-10-08 ENCOUNTER — Other Ambulatory Visit: Payer: Medicare Other

## 2012-10-08 DIAGNOSIS — I10 Essential (primary) hypertension: Secondary | ICD-10-CM

## 2012-10-08 DIAGNOSIS — E7800 Pure hypercholesterolemia, unspecified: Secondary | ICD-10-CM

## 2012-10-08 LAB — LIPID PANEL
Total CHOL/HDL Ratio: 2.1 Ratio
VLDL: 8 mg/dL (ref 0–40)

## 2012-10-08 LAB — COMPREHENSIVE METABOLIC PANEL
AST: 22 U/L (ref 0–37)
Albumin: 4.2 g/dL (ref 3.5–5.2)
Alkaline Phosphatase: 54 U/L (ref 39–117)
Potassium: 3.9 mEq/L (ref 3.5–5.3)
Sodium: 138 mEq/L (ref 135–145)
Total Protein: 6.7 g/dL (ref 6.0–8.3)

## 2012-10-13 ENCOUNTER — Encounter: Payer: Medicare Other | Admitting: Family Medicine

## 2012-10-17 ENCOUNTER — Ambulatory Visit (INDEPENDENT_AMBULATORY_CARE_PROVIDER_SITE_OTHER): Payer: Medicare Other | Admitting: Family Medicine

## 2012-10-17 ENCOUNTER — Encounter: Payer: Self-pay | Admitting: Family Medicine

## 2012-10-17 VITALS — BP 140/70 | HR 72 | Ht 70.5 in | Wt 162.0 lb

## 2012-10-17 DIAGNOSIS — I2510 Atherosclerotic heart disease of native coronary artery without angina pectoris: Secondary | ICD-10-CM

## 2012-10-17 DIAGNOSIS — Z79899 Other long term (current) drug therapy: Secondary | ICD-10-CM

## 2012-10-17 DIAGNOSIS — M109 Gout, unspecified: Secondary | ICD-10-CM

## 2012-10-17 DIAGNOSIS — E7800 Pure hypercholesterolemia, unspecified: Secondary | ICD-10-CM

## 2012-10-17 DIAGNOSIS — I10 Essential (primary) hypertension: Secondary | ICD-10-CM

## 2012-10-17 DIAGNOSIS — Z125 Encounter for screening for malignant neoplasm of prostate: Secondary | ICD-10-CM

## 2012-10-17 DIAGNOSIS — Z0000 Encounter for general adult medical examination without abnormal findings: Secondary | ICD-10-CM

## 2012-10-17 LAB — POCT URINALYSIS DIPSTICK
Blood, UA: NEGATIVE
Glucose, UA: NEGATIVE
Protein, UA: NEGATIVE
Spec Grav, UA: 1.01
Urobilinogen, UA: NEGATIVE

## 2012-10-17 MED ORDER — LISINOPRIL-HYDROCHLOROTHIAZIDE 20-12.5 MG PO TABS: 2.0000 | ORAL_TABLET | Freq: Every day | ORAL | Status: DC

## 2012-10-17 MED ORDER — EZETIMIBE-SIMVASTATIN 10-80 MG PO TABS: 1.0000 | ORAL_TABLET | Freq: Every day | ORAL | Status: DC

## 2012-10-17 NOTE — Patient Instructions (Signed)
HEALTH MAINTENANCE RECOMMENDATIONS:  It is recommended that you get at least 30 minutes of aerobic exercise at least 5 days/week (for weight loss, you may need as much as 60-90 minutes). This can be any activity that gets your heart rate up. This can be divided in 10-15 minute intervals if needed, but try and build up your endurance at least once a week.  Weight bearing exercise is also recommended twice weekly.  Eat a healthy diet with lots of vegetables, fruits and fiber.  "Colorful" foods have a lot of vitamins (ie green vegetables, tomatoes, red peppers, etc).  Limit sweet tea, regular sodas and alcoholic beverages, all of which has a lot of calories and sugar.  Up to 2 alcoholic drinks daily may be beneficial for men (unless trying to lose weight, watch sugars).  Drink a lot of water.  Sunscreen of at least SPF 30 should be used on all sun-exposed parts of the skin when outside between the hours of 10 am and 4 pm (not just when at beach or pool, but even with exercise, golf, tennis, and yard work!)  Use a sunscreen that says "broad spectrum" so it covers both UVA and UVB rays, and make sure to reapply every 1-2 hours.  Remember to change the batteries in your smoke detectors when changing your clock times in the spring and fall.  Use your seat belt every time you are in a car, and please drive safely and not be distracted with cell phones and texting while driving.   Lab Results Component Value Date  CHOL 124 10/08/2012  HDL 60 10/08/2012  LDLCALC 56 10/08/2012  TRIG 39 10/08/2012  CHOLHDL 2.1 10/08/2012    Chemistry    Component Value Date/Time  NA 138 10/08/2012 0834  K 3.9 10/08/2012 0834  CL 99 10/08/2012 0834  CO2 29 10/08/2012 0834  BUN 19 10/08/2012 0834  CREATININE 1.19 10/08/2012 0834     Component Value Date/Time  CALCIUM 9.1 10/08/2012 0834  ALKPHOS 54 10/08/2012 0834  AST 22 10/08/2012 0834  ALT 17 10/08/2012 0834  BILITOT 1.1 10/08/2012 0834   Glucose  89

## 2012-10-17 NOTE — Progress Notes (Signed)
Chief Complaint  Patient presents with  . Annual Exam    annual exam-labs done 10/08/12. No major complaints.   Bradley Terrell is a 71 y.o. male who presents for a complete physical. He is also here for med check, had labs done prior to visit.  HTN:  Back on his usual regimen of lisinopril HCTZ and BP's have been running 112-143/61-81 (1x high of 167/93, lower on repeat 15 mins later).  Pulse 70-90.  Denies cough, headaches, dizziness, chest pain.  Hyperlipidemia follow-up:  Patient is reportedly following a low-fat, low cholesterol diet.  Compliant with medications and denies medication side effects.  Has done well reaching goals on Vytorin.  Gout:  Hasn't had any flares.  Remains on uloric.  (previously tried stopping HCT so that uric acid would be lower to possibly decrease need for uloric, but BP's didn't do as well without HCT, and he continued to take the Uloric regardless, so we are continuing with current regimen).  Inguinal hernias--remain asymptomatic.  Admits to drinking more alcohol than as previously discussed, now having 3 drinks daily.  He reports being extremely happy, with a great wife.  His son has recurrent colon cancer, and has a poor prognosis.  Health Maintenance: Immunization History  Administered Date(s) Administered  . Influenza Split 09/17/2012  . Pneumococcal Conjugate 03/10/2005  . Tdap 01/10/2009  . Zoster 01/10/2005   Last colonoscopy: 03/2009 Last PSA: >1 year ago.  Not done with recent labs Dentist: twice yearly Ophtho: usually yearly, but missed this year Exercise: daily physical activity on the job.    Past Medical History  Diagnosis Date  . CAD (coronary artery disease)     Dr. Eldridge Dace.  atherectomy from RCA and LAD '91  . HTN (hypertension)   . Renal cyst     left  . Gilbert syndrome     elevated indirect bilirubin  . Wheezing     with beta blockers (per Dr. Lindell Spar notes)  . Inguinal hernia     R>L, easily reducible  . Wheezing    WITH B-BLOCKERS    Past Surgical History  Procedure Date  . Tonsillectomy age 55  . Cardiac atherectomy '91    to LAD and RCA  . Prk '96    (refractive eye surgery)  . Colonoscopy 03/2009    History   Social History  . Marital Status: Married    Spouse Name: N/A    Number of Children: 5  . Years of Education: N/A   Occupational History  . 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 Smoking status: Never Smoker   . Smokeless tobacco: Never Used  . Alcohol Use: Yes     Comment: bourbon and ginger daily after work, wine with dinner, stinger in the evening.  . Drug Use: No  . Sexually Active: Not on file   Other Topics Concern  . Not on file   Social History Narrative   Married, 1 dog.  Son in [place], [place], [place], dtr in [place].    Family History  Problem Relation Age of Onset  . Cancer Mother     breast  . Hypertension Father   . Heart disease Father   . Cancer Sister     pancreatic cancer  . Heart disease Brother 98    CABG  . Cancer Son 108    colon cancer, recurred at 46  . Diabetes Neg Hx   . Cancer Brother 62    lung  cancer, smoker  . Parkinson's disease Sister   . Hypertension Sister     Current outpatient prescriptions:Calcium Carbonate-Vit D-Min (CALCIUM 600+D PLUS MINERALS) 600-400 MG-UNIT TABS, Take 1 tablet by mouth daily.  , Disp: , Rfl: ;  cholecalciferol (VITAMIN D) 1000 UNITS tablet, Take 1,000 Units by mouth daily., Disp: , Rfl: ;  ezetimibe-simvastatin (VYTORIN) 10-80 MG per tablet, Take 1 tablet by mouth at bedtime., Disp: 90 tablet, Rfl: 3 febuxostat (ULORIC) 40 MG tablet, Take 1 tablet (40 mg total) by mouth daily., Disp: 90 tablet, Rfl: 1;  fish oil-omega-3 fatty acids 1000 MG capsule, Take 1 g by mouth daily.  , Disp: , Rfl: ;  Glucosamine 500 MG CAPS, Take 1 capsule by mouth daily. , Disp: , Rfl: ;  lisinopril-hydrochlorothiazide (PRINZIDE,ZESTORETIC) 20-12.5 MG per tablet, Take 2 tablets by mouth daily., Disp: 180 tablet, Rfl:  1 Misc Natural Products (TART CHERRY ADVANCED PO), Take 1 tablet by mouth daily., Disp: , Rfl: ;  Multiple Vitamins-Minerals (CENTRUM SILVER PO), Take 1 tablet by mouth daily.  , Disp: , Rfl: ;  saw palmetto 160 MG capsule, Take 160 mg by mouth daily. , Disp: , Rfl: ;  selenium 50 MCG TABS, Take 50 mcg by mouth daily.  , Disp: , Rfl: ;  Zinc Sulfate (ZINC 15 PO), Take 1 tablet by mouth daily.  , Disp: , Rfl:  [DISCONTINUED] ezetimibe-simvastatin (VYTORIN) 10-80 MG per tablet, Take 1 tablet by mouth at bedtime., Disp: 90 tablet, Rfl: 0;  febuxostat (ULORIC) 40 MG tablet, Take 1 tablet (40 mg total) by mouth daily., Disp: 90 tablet, Rfl: 1  No Known Allergies  ROS:  The patient denies anorexia, fever, weight changes, headaches,  vision loss, decreased hearing, ear pain, hoarseness, chest pain, palpitations, dizziness, syncope, dyspnea on exertion, cough, swelling, nausea, vomiting, diarrhea, constipation, abdominal pain, melena, hematochezia, indigestion/heartburn, hematuria, incontinence, erectile dysfunction, nocturia, weakened urine stream, dysuria, genital lesions, joint pains, numbness, tingling, weakness, tremor, suspicious skin lesions, depression, anxiety, abnormal bleeding/bruising, or enlarged lymph nodes  PHYSICAL EXAM: BP 140/70  Pulse 72  Ht 5' 10.5" (1.791 m)  Wt 162 lb (73.483 kg)  BMI 22.92 kg/m2  General Appearance:    Alert, cooperative, no distress, appears stated age  Head:    Normocephalic, without obvious abnormality, atraumatic  Eyes:    PERRL, conjunctiva/corneas clear, EOM's intact, fundi    benign  Ears:    Normal TM's and external ear canals  Nose:   Nares normal, mucosa normal, no drainage or sinus   tenderness  Throat:   Lips, mucosa, and tongue normal; teeth and gums normal  Neck:   Supple, no lymphadenopathy;  thyroid:  no   enlargement/tenderness/nodules; no carotid   bruit or JVD  Back:    Spine nontender, no curvature, ROM normal, no CVA     tenderness  Lungs:      Clear to auscultation bilaterally without wheezes, rales or     ronchi; respirations unlabored  Chest Wall:    No tenderness or deformity   Heart:    Regular rate and rhythm, S1 and S2 normal, no murmur, rub   or gallop  Breast Exam:    No chest wall tenderness, masses or gynecomastia  Abdomen:     Soft, non-tender, nondistended, normoactive bowel sounds,    no masses, no hepatosplenomegaly  Genitalia:    Normal male external genitalia without lesions.  Testicles without masses.  R>L inguinal hernias present, easily reducible, nontender  Rectal:    Normal  sphincter tone, no masses or tenderness; guaiac negative stool.  Prostate smooth, no nodules, not enlarged.  Extremities:   No clubbing, cyanosis or edema.  Thickened onychomycotic toenails  Pulses:   2+ and symmetric all extremities  Skin:   Skin color, texture, turgor normal, no rashes or lesions. Actinic changes to scalp, some telangiectasias on face  Lymph nodes:   Cervical, supraclavicular, and axillary nodes normal  Neurologic:   CNII-XII intact, normal strength, sensation and gait; reflexes 2+ and symmetric throughout          Psych:   Normal mood, affect, hygiene and grooming.     Lab Results  Component Value Date   CHOL 124 10/08/2012   HDL 60 10/08/2012   LDLCALC 56 10/08/2012   TRIG 39 10/08/2012   CHOLHDL 2.1 10/08/2012     Chemistry      Component Value Date/Time   NA 138 10/08/2012 0834   K 3.9 10/08/2012 0834   CL 99 10/08/2012 0834   CO2 29 10/08/2012 0834   BUN 19 10/08/2012 0834   CREATININE 1.19 10/08/2012 0834      Component Value Date/Time   CALCIUM 9.1 10/08/2012 0834   ALKPHOS 54 10/08/2012 0834   AST 22 10/08/2012 0834   ALT 17 10/08/2012 0834   BILITOT 1.1 10/08/2012 0834     Glucose 89   ASSESSMENT/PLAN:  1. Routine general medical examination at a health care facility  Visual acuity screening, POCT Urinalysis Dipstick  2. Pure hypercholesterolemia  ezetimibe-simvastatin (VYTORIN) 10-80  MG per tablet, Lipid panel  3. Gout  Uric acid  4. Essential hypertension, benign  lisinopril-hydrochlorothiazide (PRINZIDE,ZESTORETIC) 20-12.5 MG per tablet, Comprehensive metabolic panel  5. CAD (coronary artery disease)  Comprehensive metabolic panel  6. Special screening for malignant neoplasm of prostate  PSA, Medicare  7. Encounter for long-term (current) use of other medications  CBC with Differential, Lipid panel, Comprehensive metabolic panel, Uric acid    Discussed PSA screening (risks/benefits), recommended at least 30 minutes of aerobic activity at least 5 days/week; proper sunscreen use reviewed; healthy diet and alcohol recommendations (less than or equal to 2 drinks/day) reviewed; regular seatbelt use; changing batteries in smoke detectors. Self-testicular exams. Immunization recommendations discussed.  Colonoscopy recommendations reviewed, UTD  Pt elects to do PSA with labs in 6 months. Declines pneumovax booster today--consider next year.  Pt reports having advanced directives and healthcare power of attorney.

## 2012-12-15 ENCOUNTER — Other Ambulatory Visit: Payer: Self-pay | Admitting: *Deleted

## 2012-12-15 MED ORDER — FEBUXOSTAT 40 MG PO TABS: 40.0000 mg | ORAL_TABLET | Freq: Every day | ORAL | Status: DC

## 2013-02-23 ENCOUNTER — Telehealth: Payer: Self-pay | Admitting: Family Medicine

## 2013-02-23 DIAGNOSIS — I10 Essential (primary) hypertension: Secondary | ICD-10-CM

## 2013-02-23 MED ORDER — LISINOPRIL-HYDROCHLOROTHIAZIDE 20-12.5 MG PO TABS: 2.0000 | ORAL_TABLET | Freq: Every day | ORAL | Status: DC

## 2013-02-23 NOTE — Telephone Encounter (Signed)
Done

## 2013-04-03 ENCOUNTER — Other Ambulatory Visit: Payer: Medicare Other

## 2013-04-03 DIAGNOSIS — Z79899 Other long term (current) drug therapy: Secondary | ICD-10-CM

## 2013-04-03 DIAGNOSIS — I10 Essential (primary) hypertension: Secondary | ICD-10-CM

## 2013-04-03 DIAGNOSIS — Z125 Encounter for screening for malignant neoplasm of prostate: Secondary | ICD-10-CM

## 2013-04-03 DIAGNOSIS — E7800 Pure hypercholesterolemia, unspecified: Secondary | ICD-10-CM

## 2013-04-03 DIAGNOSIS — I2510 Atherosclerotic heart disease of native coronary artery without angina pectoris: Secondary | ICD-10-CM

## 2013-04-03 DIAGNOSIS — M109 Gout, unspecified: Secondary | ICD-10-CM

## 2013-04-03 LAB — CBC WITH DIFFERENTIAL/PLATELET
Basophils Absolute: 0 10*3/uL (ref 0.0–0.1)
Lymphocytes Relative: 26 % (ref 12–46)
Neutro Abs: 3 10*3/uL (ref 1.7–7.7)
Neutrophils Relative %: 59 % (ref 43–77)
Platelets: 177 10*3/uL (ref 150–400)
RDW: 13.3 % (ref 11.5–15.5)
WBC: 5.2 10*3/uL (ref 4.0–10.5)

## 2013-04-04 LAB — COMPREHENSIVE METABOLIC PANEL
CO2: 29 mEq/L (ref 19–32)
Creat: 1.15 mg/dL (ref 0.50–1.35)
Glucose, Bld: 97 mg/dL (ref 70–99)
Sodium: 139 mEq/L (ref 135–145)
Total Bilirubin: 1 mg/dL (ref 0.3–1.2)
Total Protein: 6.3 g/dL (ref 6.0–8.3)

## 2013-04-04 LAB — PSA, MEDICARE: PSA: 0.67 ng/mL (ref <=4.00)

## 2013-04-04 LAB — LIPID PANEL
HDL: 55 mg/dL (ref >39)
Triglycerides: 60 mg/dL (ref <150)

## 2013-04-04 LAB — URIC ACID: Uric Acid, Serum: 5 mg/dL (ref 4.0–7.8)

## 2013-04-09 ENCOUNTER — Encounter: Payer: Self-pay | Admitting: Family Medicine

## 2013-04-09 ENCOUNTER — Ambulatory Visit (INDEPENDENT_AMBULATORY_CARE_PROVIDER_SITE_OTHER): Payer: Medicare Other | Admitting: Family Medicine

## 2013-04-09 VITALS — BP 110/68 | HR 76 | Ht 70.5 in | Wt 169.0 lb

## 2013-04-09 DIAGNOSIS — I2510 Atherosclerotic heart disease of native coronary artery without angina pectoris: Secondary | ICD-10-CM

## 2013-04-09 DIAGNOSIS — I10 Essential (primary) hypertension: Secondary | ICD-10-CM

## 2013-04-09 DIAGNOSIS — E7800 Pure hypercholesterolemia, unspecified: Secondary | ICD-10-CM

## 2013-04-09 DIAGNOSIS — M109 Gout, unspecified: Secondary | ICD-10-CM

## 2013-04-09 MED ORDER — LISINOPRIL-HYDROCHLOROTHIAZIDE 20-12.5 MG PO TABS: 2.0000 | ORAL_TABLET | Freq: Every day | ORAL | Status: DC

## 2013-04-09 MED ORDER — FEBUXOSTAT 40 MG PO TABS: 40.0000 mg | ORAL_TABLET | Freq: Every day | ORAL | Status: DC

## 2013-04-09 NOTE — Patient Instructions (Signed)
Continue your current medications.  See you in November for your physical, sooner if you have any problems

## 2013-04-09 NOTE — Progress Notes (Signed)
Chief Complaint  Patient presents with  . Med check    med check-labs done 04/03/13.   Patient presents for a med check.  He reports feeling great, doing well.   CAD/HTN:  Very mild short-lived episodes of feeling a slight shortness of breath--often improved by position change, or getting up and walking around (happens when sitting at the computer).  Denies any exertional chest pain or shortness of breath.  Keeping active in the yard, raking, plus running through the building at work 4 days/week.  Denies headaches or dizziness.  Occasionally checks BP's and runs 120's-134/60's-70's, and pulse is usually around 60-70. He sees Dr. Eldridge Dace once yearly.  Gout: He hasn't had any gout flares. Hyperlipidemia follow-up:  Patient is reportedly following a low-fat, low cholesterol diet.  Compliant with medications and denies medication side effects.  He has had changes in his diet related to baseball season--eating out more in restaurants rather than healthier eating at home.  Drinks more beer during baseball season, but only 1 beer at game (splits 2 beers with his wife).    Inguinal hernia--denies any pain, remains reducible.  Son recently had recurrence of colon cancer, metastatic.  He had some new kind of treatment (radiation), and has so far responded quite well. He is expecting a grandson out in [place]  Past Medical History  Diagnosis Date  . CAD (coronary artery disease)     Dr. Eldridge Dace.  atherectomy from RCA and LAD '91  . HTN (hypertension)   . Renal cyst     left  . Gilbert syndrome     elevated indirect bilirubin  . Wheezing     with beta blockers (per Dr. Lindell Spar notes)  . Inguinal hernia     R>L, easily reducible  . Wheezing     WITH B-BLOCKERS   Past Surgical History  Procedure Laterality Date  . Tonsillectomy  age 63  . Cardiac atherectomy  '91    to LAD and RCA  . Prk  '96    (refractive eye surgery)  . Colonoscopy  03/2009   History   Social History  . Marital  Status: Married    Spouse Name: N/A    Number of Children: 5  . Years of Education: N/A   Occupational History  . 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 Smoking status: Never Smoker   . Smokeless tobacco: Never Used  . Alcohol Use: Yes     Comment: bourbon and ginger daily after work, wine with dinner, stinger in the evening.  . Drug Use: No  . Sexually Active: Not on file   Other Topics Concern  . Not on file   Social History Narrative   Married, 1 dog.  Son in [place], [place], [place], dtr in [place]. Son recently had recurrence of colon cancer, metastatic ([place])   Family History  Problem Relation Age of Onset  . Cancer Mother     breast  . Hypertension Father   . Heart disease Father   . Cancer Sister     pancreatic cancer  . Heart disease Brother 16    CABG  . Cancer Son 61    colon cancer, recurred at 21 (metastatic)  . Diabetes Neg Hx   . Cancer Brother 34    lung cancer, smoker  . Parkinson's disease Sister   . Hypertension Sister   . Cancer Other    Current Outpatient Prescriptions on File Prior to Visit  Medication Sig  Dispense Refill  . Calcium Carbonate-Vit D-Min (CALCIUM 600+D PLUS MINERALS) 600-400 MG-UNIT TABS Take 1 tablet by mouth daily.        . cholecalciferol (VITAMIN D) 1000 UNITS tablet Take 1,000 Units by mouth daily.      Marland Kitchen ezetimibe-simvastatin (VYTORIN) 10-80 MG per tablet Take 1 tablet by mouth at bedtime.  90 tablet  3  . fish oil-omega-3 fatty acids 1000 MG capsule Take 1 g by mouth daily.        . Glucosamine 500 MG CAPS Take 1 capsule by mouth daily.       . Misc Natural Products (TART CHERRY ADVANCED PO) Take 1 tablet by mouth daily.      . Multiple Vitamins-Minerals (CENTRUM SILVER PO) Take 1 tablet by mouth daily.        . saw palmetto 160 MG capsule Take 160 mg by mouth daily.       . Zinc Sulfate (ZINC 15 PO) Take 1 tablet by mouth daily.        Marland Kitchen selenium 50 MCG TABS Take 50 mcg by mouth daily.         No current  facility-administered medications on file prior to visit.   No Known Allergies  ROS:  Denies headaches, dizziness, chest pain, palpitations, nausea, vomiting, bowel changes, joint pains, myalgias, skin concerns, GU complaints, depression  PHYSICAL EXAM: BP 110/68  Pulse 76  Ht 5' 10.5" (1.791 m)  Wt 169 lb (76.658 kg)  BMI 23.9 kg/m2 Well developed male, suntanned on face, in no distress  Neck: no lymphadenopathy, thyromegaly or mass. No carotid bruit  Heart: regular rate and rhythm without murmur  Lungs: clear bilaterally  Abdomen: soft, nontender  Extremities: no edema, 2+ pulse Back: no CVA or spine tenderness Skin: no rash  Psych: normal mood, affect, hygiene, grooming Neuro: alert and oriented.  Cranial nerves grossly intact.  Normal gait, strength  ASSESSMENT/PLAN:  Essential hypertension, benign - well controlled - Plan: lisinopril-hydrochlorothiazide (PRINZIDE,ZESTORETIC) 20-12.5 MG per tablet  Gout - stable, on uloric - Plan: febuxostat (ULORIC) 40 MG tablet  CAD (coronary artery disease) - stable  Pure hypercholesterolemia - slightly higher than previously, due to diet changes.  continue current meds, lowfat diet - Plan: Lipid panel, Hepatic function panel  Reviewed signs/symptoms of strangulation of inguinal hernia, and to seek care if they develop.  F/u at CPE

## 2013-07-01 ENCOUNTER — Telehealth: Payer: Self-pay | Admitting: Family Medicine

## 2013-07-01 DIAGNOSIS — M109 Gout, unspecified: Secondary | ICD-10-CM

## 2013-07-01 MED ORDER — FEBUXOSTAT 40 MG PO TABS: 40.0000 mg | ORAL_TABLET | Freq: Every day | ORAL | Status: DC

## 2013-07-01 NOTE — Telephone Encounter (Signed)
Done. And it was for Uloric, not accupril-verified with patient via phone.

## 2013-07-10 ENCOUNTER — Telehealth: Payer: Self-pay | Admitting: Internal Medicine

## 2013-07-10 DIAGNOSIS — M109 Gout, unspecified: Secondary | ICD-10-CM

## 2013-07-10 MED ORDER — FEBUXOSTAT 40 MG PO TABS: 40.0000 mg | ORAL_TABLET | Freq: Every day | ORAL | Status: DC

## 2013-07-10 NOTE — Telephone Encounter (Signed)
Pt med never got sent to pharmacy. Had to resend it

## 2013-07-15 ENCOUNTER — Other Ambulatory Visit: Payer: Self-pay

## 2013-09-14 ENCOUNTER — Encounter: Payer: Self-pay | Admitting: *Deleted

## 2013-09-19 ENCOUNTER — Other Ambulatory Visit: Payer: Self-pay | Admitting: Family Medicine

## 2013-10-16 ENCOUNTER — Other Ambulatory Visit: Payer: Medicare Other

## 2013-10-16 DIAGNOSIS — E7800 Pure hypercholesterolemia, unspecified: Secondary | ICD-10-CM

## 2013-10-16 LAB — LIPID PANEL
Cholesterol: 128 mg/dL (ref 0–200)
HDL: 53 mg/dL (ref >39)
Total CHOL/HDL Ratio: 2.4 Ratio
Triglycerides: 98 mg/dL (ref <150)

## 2013-10-16 LAB — HEPATIC FUNCTION PANEL
ALT: 20 U/L (ref 0–53)
AST: 23 U/L (ref 0–37)
Albumin: 4.1 g/dL (ref 3.5–5.2)
Alkaline Phosphatase: 45 U/L (ref 39–117)
Total Bilirubin: 1.3 mg/dL — ABNORMAL HIGH (ref 0.3–1.2)

## 2013-10-22 ENCOUNTER — Ambulatory Visit (INDEPENDENT_AMBULATORY_CARE_PROVIDER_SITE_OTHER): Payer: Medicare Other | Admitting: Family Medicine

## 2013-10-22 ENCOUNTER — Encounter: Payer: Self-pay | Admitting: Family Medicine

## 2013-10-22 VITALS — BP 120/70 | HR 68 | Ht 70.25 in | Wt 170.0 lb

## 2013-10-22 DIAGNOSIS — I10 Essential (primary) hypertension: Secondary | ICD-10-CM

## 2013-10-22 DIAGNOSIS — M109 Gout, unspecified: Secondary | ICD-10-CM

## 2013-10-22 DIAGNOSIS — I2510 Atherosclerotic heart disease of native coronary artery without angina pectoris: Secondary | ICD-10-CM

## 2013-10-22 DIAGNOSIS — Z125 Encounter for screening for malignant neoplasm of prostate: Secondary | ICD-10-CM

## 2013-10-22 DIAGNOSIS — Z23 Encounter for immunization: Secondary | ICD-10-CM

## 2013-10-22 DIAGNOSIS — Z0000 Encounter for general adult medical examination without abnormal findings: Secondary | ICD-10-CM

## 2013-10-22 DIAGNOSIS — Z79899 Other long term (current) drug therapy: Secondary | ICD-10-CM

## 2013-10-22 DIAGNOSIS — E7800 Pure hypercholesterolemia, unspecified: Secondary | ICD-10-CM

## 2013-10-22 LAB — POCT URINALYSIS DIPSTICK
Bilirubin, UA: NEGATIVE
Blood, UA: NEGATIVE
Glucose, UA: NEGATIVE
Ketones, UA: NEGATIVE
Nitrite, UA: NEGATIVE
Spec Grav, UA: 1.02
Urobilinogen, UA: NEGATIVE

## 2013-10-22 MED ORDER — EZETIMIBE-SIMVASTATIN 10-80 MG PO TABS: 1.0000 | ORAL_TABLET | Freq: Every day | ORAL | Status: DC

## 2013-10-22 MED ORDER — LISINOPRIL-HYDROCHLOROTHIAZIDE 20-12.5 MG PO TABS: ORAL_TABLET | ORAL | Status: DC

## 2013-10-22 NOTE — Patient Instructions (Signed)
HEALTH MAINTENANCE RECOMMENDATIONS:  It is recommended that you get at least 30 minutes of aerobic exercise at least 5 days/week (for weight loss, you may need as much as 60-90 minutes). This can be any activity that gets your heart rate up. This can be divided in 10-15 minute intervals if needed, but try and build up your endurance at least once a week.  Weight bearing exercise is also recommended twice weekly.  Eat a healthy diet with lots of vegetables, fruits and fiber.  "Colorful" foods have a lot of vitamins (ie green vegetables, tomatoes, red peppers, etc).  Limit sweet tea, regular sodas and alcoholic beverages, all of which has a lot of calories and sugar.  Up to 2 alcoholic drinks daily may be beneficial for men (unless trying to lose weight, watch sugars).  Drink a lot of water.  Sunscreen of at least SPF 30 should be used on all sun-exposed parts of the skin when outside between the hours of 10 am and 4 pm (not just when at beach or pool, but even with exercise, golf, tennis, and yard work!)  Use a sunscreen that says "broad spectrum" so it covers both UVA and UVB rays, and make sure to reapply every 1-2 hours.  Remember to change the batteries in your smoke detectors when changing your clock times in the spring and fall.  Use your seat belt every time you are in a car, and please drive safely and not be distracted with cell phones and texting while driving.  Schedule your colonoscopy for spring 2015.

## 2013-10-22 NOTE — Progress Notes (Signed)
Chief Complaint  Patient presents with  . Annual Exam    nonfasting annual exam, labs already done. Did not do eye rxam as he just had one. Did mention a minor SOB on occasion. Also his right eye has been swollen.    Bradley Terrell is a 72 y.o. male who presents for a complete physical.  He has no specific concerns, but also here for a med check to follow up on his chronic medical problems.  He had labs prior to his visit today.   HTN:  Blood pressures have remained normal elsewhere.  Denies cough, headaches, dizziness, chest pain.   Hyperlipidemia follow-up: Patient is reportedly following a low-fat, low cholesterol diet. Compliant with medications and denies medication side effects. Has done well reaching goals on Vytorin.   Gout: Hasn't had any flares. Remains on uloric. (previously tried stopping HCT so that uric acid would be lower to possibly decrease need for uloric, but BP's didn't do as well without HCT).   Inguinal hernias--remain asymptomatic.   Some eye swelling (right eye only) since getting a new cat.  Sometimes has shortness of breath--very short-lived, for 3-4 minutes, and can occur while at rest, no pattern to when it happens (rest/exertion, position).   Immunization History  Administered Date(s) Administered  . Influenza Split 09/17/2012  . Influenza Whole 08/25/2013  . Pneumococcal Conjugate 03/10/2005  . Tdap 01/10/2009  . Zoster 01/10/2005   Last colonoscopy: 03/2009 at [facility], due again 03/2014 Last PSA: 03/2013 Dentist: twice yearly  Ophtho: yearly (approx 4 months ago) Exercise: daily physical activity on the job, yardwork on the weekends.  Walks in the evenings 1 mile, weather-permitting.  Past Medical History  Diagnosis Date  . CAD (coronary artery disease) 1991    Dr. Eldridge Dace.  atherectomy from RCA and LAD '91  . HTN (hypertension)   . Renal cyst     left  . Gilbert syndrome     elevated indirect bilirubin  . Wheezing     with beta blockers (per Dr.  Lindell Spar notes)  . Inguinal hernia     R>L, easily reducible  . Wheezing     WITH B-BLOCKERS    Past Surgical History  Procedure Laterality Date  . Tonsillectomy  age 40  . Cardiac atherectomy  '91    to LAD and RCA  . Prk  '96    (refractive eye surgery)  . Colonoscopy  03/2009    History   Social History  . Marital Status: Married    Spouse Name: N/A    Number of Children: 5  . Years of Education: N/A   Occupational History  . 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 Smoking status: Never Smoker   . Smokeless tobacco: Never Used  . Alcohol Use: Yes     Comment: bourbon and ginger daily after work, wine with dinner, stinger in the evening.  . Drug Use: No  . Sexual Activity: Not on file   Other Topics Concern  . Not on file   Social History Narrative   Married, 1 dog, 2 cats.  Son in [place], [place] (but moving to [place]), [place], dtr in [place]. Son recently had recurrence of [place], [place] ([place]), s/p experimental radiation treatment with good response. 7-8 grandchildren, 1 great-grandchild    Family History  Problem Relation Age of Onset  . Cancer Mother     breast  . Hypertension Father   . Heart disease Father   .  Cancer Sister     pancreatic cancer  . Heart disease Brother 76    CABG  . Cancer Son 59    colon cancer, recurred at 65 (metastatic)  . Diabetes Neg Hx   . Cancer Brother 38    lung cancer, smoker  . Parkinson's disease Sister   . Hypertension Sister   . Cancer Other     Current outpatient prescriptions:Biotin 5000 MCG CAPS, Take 1 capsule by mouth daily., Disp: , Rfl: ;  Calcium Carbonate-Vit D-Min (CALCIUM 600+D PLUS MINERALS) 600-400 MG-UNIT TABS, Take 1 tablet by mouth daily.  , Disp: , Rfl: ;  cholecalciferol (VITAMIN D) 1000 UNITS tablet, Take 1,000 Units by mouth daily., Disp: , Rfl: ;  ezetimibe-simvastatin (VYTORIN) 10-80 MG per tablet, Take 1 tablet by mouth at bedtime., Disp: 90 tablet, Rfl: 3 febuxostat  (ULORIC) 40 MG tablet, Take 1 tablet (40 mg total) by mouth daily., Disp: 90 tablet, Rfl: 1;  fish oil-omega-3 fatty acids 1000 MG capsule, Take 1 g by mouth daily.  , Disp: , Rfl: ;  Glucosamine 500 MG CAPS, Take 1 capsule by mouth daily. , Disp: , Rfl: ;  lisinopril-hydrochlorothiazide (PRINZIDE,ZESTORETIC) 20-12.5 MG per tablet, Take 2 tablets by mouth  daily, Disp: 180 tablet, Rfl: 0 Multiple Vitamins-Minerals (CENTRUM SILVER PO), Take 1 tablet by mouth daily.  , Disp: , Rfl: ;  Probiotic Product (PROBIOTIC DAILY PO), Take by mouth., Disp: , Rfl: ;  saw palmetto 160 MG capsule, Take 160 mg by mouth daily. , Disp: , Rfl: ;  vitamin E 100 UNIT capsule, Take 100 Units by mouth daily., Disp: , Rfl: ;  Zinc Sulfate (ZINC 15 PO), Take 1 tablet by mouth daily.  , Disp: , Rfl:   Night-vites and also has gin-soaked raisins   No Known Allergies  ROS: The patient denies anorexia, fever, weight changes, headaches, vision loss, decreased hearing, ear pain, hoarseness, chest pain, palpitations, dizziness, syncope, dyspnea on exertion, cough, swelling, nausea, vomiting, diarrhea, constipation, abdominal pain, melena, hematochezia, indigestion/heartburn, hematuria, incontinence, erectile dysfunction, nocturia, weakened urine stream, dysuria, genital lesions, joint pains, numbness, tingling, weakness, tremor, suspicious skin lesions, depression, anxiety, abnormal bleeding/bruising, or enlarged lymph nodes.  Sees Dr. Elmon Else yearly  PHYSICAL EXAM:  BP 120/70  Pulse 68  Ht 5' 10.25" (1.784 m)  Wt 170 lb (77.111 kg)  BMI 24.23 kg/m2  General Appearance:  Alert, cooperative, no distress, appears stated age   Head:  Normocephalic, without obvious abnormality, atraumatic   Eyes:  PERRL, conjunctiva/corneas clear, EOM's intact, fundi  benign   Ears:  Normal TM's and external ear canals   Nose:  Nares normal, mucosa normal, no drainage or sinus tenderness   Throat:  Lips, mucosa, and tongue normal; teeth and  gums normal   Neck:  Supple, no lymphadenopathy; thyroid: no enlargement/tenderness/nodules; no carotid  bruit or JVD   Back:  Spine nontender, no curvature, ROM normal, no CVA tenderness   Lungs:  Clear to auscultation bilaterally without wheezes, rales or ronchi; respirations unlabored   Chest Wall:  No tenderness or deformity   Heart:  Regular rate and rhythm, S1 and S2 normal, no murmur, rub  or gallop   Breast Exam:  No chest wall tenderness, masses or gynecomastia   Abdomen:  Soft, non-tender, nondistended, normoactive bowel sounds,  no masses, no hepatosplenomegaly   Genitalia:  Normal male external genitalia without lesions. Testicles without masses. R>L inguinal hernias present, easily reducible, nontender   Rectal:  Normal sphincter tone,  no masses or tenderness; guaiac negative stool. Prostate smooth, no nodules, not enlarged.   Extremities:  No clubbing, cyanosis or edema. Thickened onychomycotic toenails   Pulses:  2+ and symmetric all extremities   Skin:  Skin color, texture, turgor normal, no rashes or lesions. Actinic changes to scalp, some telangiectasias on face   Lymph nodes:  Cervical, supraclavicular, and axillary nodes normal   Neurologic:  CNII-XII intact, normal strength, sensation and gait; reflexes 2+ and symmetric throughout          Psych: Normal mood, affect, hygiene and grooming.   Lab Results  Component Value Date   CHOL 128 10/16/2013   HDL 53 10/16/2013   LDLCALC 55 10/16/2013   TRIG 98 10/16/2013   CHOLHDL 2.4 10/16/2013   Lab Results  Component Value Date   ALT 20 10/16/2013   AST 23 10/16/2013   ALKPHOS 45 10/16/2013   BILITOT 1.3* 10/16/2013   Lab Results  Component Value Date   PSA 0.67 04/03/2013   ASSESSMENT/PLAN:  Pure hypercholesterolemia - at goal; continue Vytorin - Plan: ezetimibe-simvastatin (VYTORIN) 10-80 MG per tablet  Gout - no recent flares, since on uloric.  uric acid will be checked again in 6 months  Essential hypertension,  benign - controlled - Plan: lisinopril-hydrochlorothiazide (PRINZIDE,ZESTORETIC) 20-12.5 MG per tablet  CAD (coronary artery disease) - stable  Routine general medical examination at a health care facility - Plan: POCT Urinalysis Dipstick  Need for prophylactic vaccination against Streptococcus pneumoniae (pneumococcus) - has had pneumovax at approx age 27.  Prevnar given today - Plan: Pneumococcal conjugate vaccine 13-valent  No significant asymmetry noted re: swelling of eye (pt feels like right eye is much more swollen).  Discussed Ddx including allergy to cat and avoidance measures, and seasonal allergies.  Symptomatic treatment/OTC med were reviewed.  Follow up as scheduled with eye doctor.  Rare shortness of breath--to keep a record of when this occurs, how long it lasts, associated symptoms.  Reviewed DDx including possibility of arrhythmia.  He should mention this when he sees cardiologist next; f/u here sooner if increasing severity, frequency, or other concerns develop.  Discussed PSA screening (risks/benefits), recommended at least 30 minutes of aerobic activity at least 5 days/week; proper sunscreen use reviewed; healthy diet and alcohol recommendations (less than or equal to 2 drinks/day) reviewed; regular seatbelt use; changing batteries in smoke detectors. Self-testicular exams. Immunization recommendations discussed--prevnar-13 given today. Colonoscopy recommendations reviewed--due 03/2014  Pt reports having advanced directives and healthcare power of attorney, although is redoing it now.  F/u 6 months with fasting labs prior

## 2014-02-01 ENCOUNTER — Other Ambulatory Visit: Payer: Self-pay | Admitting: Family Medicine

## 2014-03-02 ENCOUNTER — Ambulatory Visit (INDEPENDENT_AMBULATORY_CARE_PROVIDER_SITE_OTHER): Payer: Medicare Other | Admitting: Interventional Cardiology

## 2014-03-02 ENCOUNTER — Encounter: Payer: Self-pay | Admitting: Interventional Cardiology

## 2014-03-02 VITALS — BP 142/60 | HR 92 | Ht 71.0 in | Wt 169.0 lb

## 2014-03-02 DIAGNOSIS — I10 Essential (primary) hypertension: Secondary | ICD-10-CM

## 2014-03-02 DIAGNOSIS — I2510 Atherosclerotic heart disease of native coronary artery without angina pectoris: Secondary | ICD-10-CM

## 2014-03-02 DIAGNOSIS — E7800 Pure hypercholesterolemia, unspecified: Secondary | ICD-10-CM

## 2014-03-02 MED ORDER — ATORVASTATIN CALCIUM 80 MG PO TABS: 80.0000 mg | ORAL_TABLET | Freq: Every day | ORAL | Status: DC

## 2014-03-02 NOTE — Patient Instructions (Addendum)
Your physician has requested that you regularly monitor and record your blood pressure readings at home. Please use the same machine at the same time of day to check your readings and record them. Call with readings in about 1-2 weeks.   Your physician recommends that you continue on your current medications as directed. Please refer to the Current Medication list given to you today.  Your physician wants you to follow-up in: 1 year with Dr. Irish Lack. You will receive a reminder letter in the mail two months in advance. If you don't receive a letter, please call our office to schedule the follow-up appointment.  Your physician recommends that you return for a FASTING lipid profile: 05/03/14.  Your physician has recommended you make the following change in your medication:   1. Stop Vytorin.  2. Start Atorvastatin 80 mg 1 tablet daily.

## 2014-03-02 NOTE — Progress Notes (Signed)
Patient ID: Bradley Terrell, male   DOB: [birth year], 73 y.o.   MRN: [id number]    [employer], [place] [place], [place]  [address] Phone: [phone] Fax:  [phone]  Date:  03/02/2014   ID:  Bradley Terrell, DOB 1941-04-29, MRN [id number]  PCP:  Vikki Ports, MD      History of Present Illness: Bradley Terrell is a 73 y.o. male  who had CAD. HE had a 2 vessel intervention in [place] in 1991. He has done well. Alcohol has caused gout flare up. He had been walking the treadmill until his left knee started bothering him. Works 4 days a week. He works and walks a lot at work. he does yard work. No sx with that. CAD/ASCVD:  Denies : Chest pain.  Dizziness.  Dyspnea on exertion.  Leg edema.  Nitroglycerin.  Orthopnea.  Palpitations.  Paroxysmal nocturnal dyspnea.  Syncope.  Sx at the time of his CAD was a tightness of the chest. It felt like a bear hug.   Vital Signs      Wt Readings from Last 3 Encounters:  03/02/14 169 lb (76.658 kg)  10/22/13 170 lb (77.111 kg)  04/09/13 169 lb (76.658 kg)     Past Medical History  Diagnosis Date  . CAD (coronary artery disease) 1991    Dr. Irish Lack.  atherectomy from RCA and LAD '91  . HTN (hypertension)   . Renal cyst     left  . Gilbert syndrome     elevated indirect bilirubin  . Wheezing     with beta blockers (per Dr. Don Broach notes)  . Inguinal hernia     R>L, easily reducible  . Wheezing     WITH B-BLOCKERS    Current Outpatient Prescriptions  Medication Sig Dispense Refill  . Biotin 5000 MCG CAPS Take 1 capsule by mouth daily.      . Calcium Carbonate-Vit D-Min (CALCIUM 600+D PLUS MINERALS) 600-400 MG-UNIT TABS Take 1 tablet by mouth daily.        . cholecalciferol (VITAMIN D) 1000 UNITS tablet Take 1,000 Units by mouth daily.      Marland Kitchen ezetimibe-simvastatin (VYTORIN) 10-80 MG per tablet Take 1 tablet by mouth at bedtime.  90 tablet  3  . fish oil-omega-3 fatty acids 1000 MG capsule Take 1 g by mouth daily.         . Glucosamine 500 MG CAPS Take 1 capsule by mouth daily.       Marland Kitchen lisinopril-hydrochlorothiazide (PRINZIDE,ZESTORETIC) 20-12.5 MG per tablet Take 2 tablets by mouth  daily  180 tablet  3  . Multiple Vitamins-Minerals (CENTRUM SILVER PO) Take 1 tablet by mouth daily.        . NON FORMULARY raisen soaked in gin      . Probiotic Product (PROBIOTIC DAILY PO) Take by mouth.      . saw palmetto 160 MG capsule Take 160 mg by mouth daily.       Marland Kitchen ULORIC 40 MG tablet Take 1 tablet (40 mg total) by mouth daily.  90 tablet  0  . vitamin E 100 UNIT capsule Take 100 Units by mouth daily.      . Zinc Sulfate (ZINC 15 PO) Take 1 tablet by mouth daily.         No current facility-administered medications for this visit.    Allergies:   No Known Allergies  Social History:  The patient  reports that he has never smoked. He has never used smokeless  tobacco. He reports that he drinks alcohol. He reports that he does not use illicit drugs.   Family History:  The patient's family history includes Cancer in his mother, other, and sister; Cancer (age of onset: 102) in his son; Cancer (age of onset: 32) in his brother; Heart disease in his father; Heart disease (age of onset: 32) in his brother; Hypertension in his father and sister; Parkinson's disease in his sister. There is no history of Diabetes.   ROS:  Please see the history of present illness.  No nausea, vomiting.  No fevers, chills.  No focal weakness.  No dysuria.    All other systems reviewed and negative.   PHYSICAL EXAM: VS:  BP 142/60  Pulse 92  Ht 5\' 11"  (1.803 m)  Wt 169 lb (76.658 kg)  BMI 23.58 kg/m2 Well nourished, well developed, in no acute distress HEENT: normal Neck: no JVD, no carotid bruits Cardiac:  normal S1, S2; RRR;  Lungs:  clear to auscultation bilaterally, no wheezing, rhonchi or rales Abd: soft, nontender, no hepatomegaly Ext: no edema Skin: warm and dry Neuro:   no focal abnormalities noted  EKG:  NSR, LAD      ASSESSMENT AND PLAN:  Coronary atherosclerosis of native coronary artery  Continue Aspirin Tablet, 81 MG, 1 tab, Orally, qd IMAGING: EKG    Harward,Amy 04/17/2013 09:02:16 AM > Bradley Terrell,Bradley Terrell 04/17/2013 09:34:33 AM > NSR, no ST segment changes   Notes: No angina. S/p [place] in 1991. Has not been taking aspirin regularly. SHOB intermittently in the morning.  THis was noted in 2013 when he switched to Vytorin.  Negative nuclear stress test in 2013.    2. Essential hypertension, benign  Continue Lisinopril-Hydrochlorothiazide Tablet, 20-12.5 MG, 2 tablets, Orally, Once a day Notes: COntrolled. Check BP at home. If it is high there, would consider adding low dose amlodipine. Let us know if home BP > 140/90.    3. Hyperlipidemia  Continue Fish Oil Capsule, 1000 mg, 1 tab, Orally, bid Stop Vytorin Tablet, 10-80 MG, 1 tablet, Orally, Once a day  Due to cost, $5/day Notes: Lipids well controlled per Dr. Tomi Bamberger. Start atorvastatin 80 mg daily. If he is not controlled or does not tolerate, he may be a candidate for PCSK-9 inhibitor.  Lipids should be checked in early June either here or with Dr. Tomi Bamberger.        Signed, Mina Marble, MD, [facility] 03/02/2014 5:02 PM

## 2014-04-15 ENCOUNTER — Other Ambulatory Visit: Payer: Medicare Other

## 2014-04-15 DIAGNOSIS — M109 Gout, unspecified: Secondary | ICD-10-CM

## 2014-04-15 DIAGNOSIS — E7800 Pure hypercholesterolemia, unspecified: Secondary | ICD-10-CM

## 2014-04-15 DIAGNOSIS — I2510 Atherosclerotic heart disease of native coronary artery without angina pectoris: Secondary | ICD-10-CM

## 2014-04-15 DIAGNOSIS — Z0000 Encounter for general adult medical examination without abnormal findings: Secondary | ICD-10-CM

## 2014-04-15 DIAGNOSIS — I10 Essential (primary) hypertension: Secondary | ICD-10-CM

## 2014-04-15 DIAGNOSIS — Z125 Encounter for screening for malignant neoplasm of prostate: Secondary | ICD-10-CM

## 2014-04-15 DIAGNOSIS — Z79899 Other long term (current) drug therapy: Secondary | ICD-10-CM

## 2014-04-15 LAB — LIPID PANEL
CHOLESTEROL: 148 mg/dL (ref 0–200)
HDL: 58 mg/dL (ref >39)
LDL Cholesterol: 74 mg/dL (ref 0–99)
Total CHOL/HDL Ratio: 2.6 Ratio
Triglycerides: 80 mg/dL (ref <150)
VLDL: 16 mg/dL (ref 0–40)

## 2014-04-15 LAB — COMPREHENSIVE METABOLIC PANEL
ALT: 16 U/L (ref 0–53)
AST: 21 U/L (ref 0–37)
Albumin: 4.2 g/dL (ref 3.5–5.2)
Alkaline Phosphatase: 47 U/L (ref 39–117)
BUN: 16 mg/dL (ref 6–23)
CALCIUM: 9.1 mg/dL (ref 8.4–10.5)
CHLORIDE: 98 meq/L (ref 96–112)
CO2: 31 meq/L (ref 19–32)
CREATININE: 1.11 mg/dL (ref 0.50–1.35)
Glucose, Bld: 78 mg/dL (ref 70–99)
Potassium: 3.8 mEq/L (ref 3.5–5.3)
SODIUM: 139 meq/L (ref 135–145)
TOTAL PROTEIN: 6.4 g/dL (ref 6.0–8.3)
Total Bilirubin: 1.5 mg/dL — ABNORMAL HIGH (ref 0.2–1.2)

## 2014-04-15 LAB — URIC ACID: URIC ACID, SERUM: 5 mg/dL (ref 4.0–7.8)

## 2014-04-16 ENCOUNTER — Ambulatory Visit: Payer: Medicare Other | Admitting: Interventional Cardiology

## 2014-04-16 LAB — CBC WITH DIFFERENTIAL/PLATELET
BASOS PCT: 1 % (ref 0–1)
Basophils Absolute: 0 10*3/uL (ref 0.0–0.1)
EOS ABS: 0.1 10*3/uL (ref 0.0–0.7)
Eosinophils Relative: 2 % (ref 0–5)
HCT: 43.5 % (ref 39.0–52.0)
Hemoglobin: 15 g/dL (ref 13.0–17.0)
Lymphocytes Relative: 33 % (ref 12–46)
Lymphs Abs: 1.4 10*3/uL (ref 0.7–4.0)
MCH: 31.9 pg (ref 26.0–34.0)
MCHC: 34.5 g/dL (ref 30.0–36.0)
MCV: 92.6 fL (ref 78.0–100.0)
MONOS PCT: 18 % — AB (ref 3–12)
Monocytes Absolute: 0.8 10*3/uL (ref 0.1–1.0)
Neutro Abs: 1.9 10*3/uL (ref 1.7–7.7)
Neutrophils Relative %: 46 % (ref 43–77)
Platelets: 198 10*3/uL (ref 150–400)
RBC: 4.7 MIL/uL (ref 4.22–5.81)
RDW: 13.4 % (ref 11.5–15.5)
WBC: 4.2 10*3/uL (ref 4.0–10.5)

## 2014-04-16 LAB — TSH: TSH: 1.913 u[IU]/mL (ref 0.350–4.500)

## 2014-04-16 LAB — PSA, MEDICARE: PSA: 0.57 ng/mL (ref <=4.00)

## 2014-04-22 ENCOUNTER — Encounter: Payer: Self-pay | Admitting: Family Medicine

## 2014-04-22 ENCOUNTER — Ambulatory Visit (INDEPENDENT_AMBULATORY_CARE_PROVIDER_SITE_OTHER): Payer: Medicare Other | Admitting: Family Medicine

## 2014-04-22 VITALS — BP 120/78 | HR 68 | Ht 70.25 in | Wt 166.0 lb

## 2014-04-22 DIAGNOSIS — I10 Essential (primary) hypertension: Secondary | ICD-10-CM

## 2014-04-22 DIAGNOSIS — E7800 Pure hypercholesterolemia, unspecified: Secondary | ICD-10-CM

## 2014-04-22 DIAGNOSIS — M109 Gout, unspecified: Secondary | ICD-10-CM

## 2014-04-22 DIAGNOSIS — I2510 Atherosclerotic heart disease of native coronary artery without angina pectoris: Secondary | ICD-10-CM

## 2014-04-22 MED ORDER — FEBUXOSTAT 40 MG PO TABS: ORAL_TABLET | ORAL | Status: DC

## 2014-04-22 NOTE — Progress Notes (Signed)
Chief Complaint  Patient presents with  . Hypertension    nonfasting med check.    Patient presents for med check, feeling great, without any complaints.  Hyperlipidemia follow-up: Compliant with medications and denies medication side effects.  It is currently baseball season--goes out to eat before the games (pizza, or bruschetta with sausage, cheese, or fish and chips, or burger or beef burrito, depending on where they go),which can sometimes be 4-7 days in a row like this. When at home, he eats much better.  Review of chart shows the fluctuations in his lipids seasonally, always higher in April/May than in October/November.  However, it is now noted that his Vytorin was changed to Lipitor 80mg  5-6 weeks ago by Dr. Irish Lack.  He had some shortness of breath that they felt might be related to the Vytorin, and also for cost purposes.  He is tolerating the Lipitor well without side effects.  HTN: Blood pressures have remained normal elsewhere. Denies cough, headaches, dizziness, chest pain.    Gout: Hasn't had any flares. Remains on uloric. (previously tried stopping HCT so that uric acid would be lower to possibly decrease need for uloric, but BP's didn't do as well without HCT).   Inguinal hernias--remain asymptomatic.   Still has some mild allergies to their cat, but no eye swelling or significant symptoms to require medications  Past Medical History  Diagnosis Date  . CAD (coronary artery disease) 1991    Dr. Irish Lack.  atherectomy from RCA and LAD '91  . HTN (hypertension)   . Renal cyst     left  . Gilbert syndrome     elevated indirect bilirubin  . Wheezing     with beta blockers (per Dr. Don Broach notes)  . Inguinal hernia     R>L, easily reducible  . Wheezing     WITH B-BLOCKERS   Past Surgical History  Procedure Laterality Date  . Tonsillectomy  age 53  . Cardiac atherectomy  '91    to LAD and RCA  . Prk  '96    (refractive eye surgery)  . Colonoscopy  03/2009    History   Social History  . Marital Status: Married    Spouse Name: N/A    Number of Children: 29  . Years of Education: N/A   Occupational History  . 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 Smoking status: Never Smoker   . Smokeless tobacco: Never Used  . Alcohol Use: Yes     Comment: bourbon and ginger daily after work, wine with dinner, stinger in the evening.  . Drug Use: No  . Sexual Activity: Not on file   Other Topics Concern  . Not on file   Social History Narrative   Married, 1 dog, 2 cats.  Son in [place], [place] (but moving to [place]), [place], dtr in [place] Son recently had recurrence of colon cancer, metastatic ([place]), s/p experimental radiation treatment with good response. 7-8 grandchildren, 1 great-grandchild    Family History  Problem Relation Age of Onset  . Cancer Mother     breast  . Hypertension Father   . Heart disease Father   . Cancer Sister     pancreatic cancer  . Heart disease Brother 62    CABG  . Cancer Brother     colon  . Colon cancer Brother     late 33's  . Cancer Son 25    colon cancer, recurred at 70 (metastatic)  .  Diabetes Neg Hx   . Cancer Brother 56    lung cancer, smoker  . Parkinson's disease Sister   . Hypertension Sister   . Cancer Other     pancreatic    Outpatient Encounter Prescriptions as of 04/22/2014  Medication Sig  . atorvastatin (LIPITOR) 80 MG tablet Take 1 tablet (80 mg total) by mouth daily.  . Biotin 5000 MCG CAPS Take 1 capsule by mouth daily.  . Calcium Carbonate-Vit D-Min (CALCIUM 600+D PLUS MINERALS) 600-400 MG-UNIT TABS Take 1 tablet by mouth daily.    . cholecalciferol (VITAMIN D) 1000 UNITS tablet Take 1,000 Units by mouth daily.  . febuxostat (ULORIC) 40 MG tablet Take 1 tablet (40 mg total) by mouth daily.  . fish oil-omega-3 fatty acids 1000 MG capsule Take 1 g by mouth daily.    . Glucosamine 500 MG CAPS Take 1 capsule by mouth daily.   Marland Kitchen lisinopril-hydrochlorothiazide  (PRINZIDE,ZESTORETIC) 20-12.5 MG per tablet Take 2 tablets by mouth  daily  . Multiple Vitamins-Minerals (CENTRUM SILVER PO) Take 1 tablet by mouth daily.    . NON FORMULARY raisen soaked in gin  . Probiotic Product (PROBIOTIC DAILY PO) Take by mouth.  . saw palmetto 160 MG capsule Take 160 mg by mouth daily.   . vitamin E 100 UNIT capsule Take 100 Units by mouth daily.  . Zinc Sulfate (ZINC 15 PO) Take 1 tablet by mouth daily.    . [DISCONTINUED] ULORIC 40 MG tablet Take 1 tablet (40 mg total) by mouth daily.   No Known Allergies  ROS:  Denies fevers, chills, weight changes, anorexia, URI symptoms, cough, shortness of breath, chest pain, palpitations, headaches, dizziness, joint pains, GI or GU problems, bleeding, bruising, rashes, depression or any other concerns. See HPI.  PHYSICAL EXAM: BP 120/78  Pulse 68  Ht 5' 10.25" (1.784 m)  Wt 166 lb (75.297 kg)  BMI 23.66 kg/m2 Well developed male, suntanned on face, in no distress  Neck: no lymphadenopathy, thyromegaly or mass. No carotid bruit  Heart: regular rate and rhythm without murmur  Lungs: clear bilaterally  Abdomen: soft, nontender  Extremities: no edema, 2+ pulse  Back: no CVA or spine tenderness  Skin: no rash  Psych: normal mood, affect, hygiene, grooming  Neuro: alert and oriented. Cranial nerves grossly intact. Normal gait, strength  Lab Results  Component Value Date   PSA 0.57 04/15/2014   PSA 0.67 04/03/2013     Chemistry      Component Value Date/Time   NA 139 04/15/2014 0855   K 3.8 04/15/2014 0855   CL 98 04/15/2014 0855   CO2 31 04/15/2014 0855   BUN 16 04/15/2014 0855   CREATININE 1.11 04/15/2014 0855      Component Value Date/Time   CALCIUM 9.1 04/15/2014 0855   ALKPHOS 47 04/15/2014 0855   AST 21 04/15/2014 0855   ALT 16 04/15/2014 0855   BILITOT 1.5* 04/15/2014 0855     Glucose 78 Uric acid 5.0 Lab Results  Component Value Date   TSH 1.913 04/15/2014   Lab Results  Component Value Date   WBC 4.2 04/15/2014   HGB  15.0 04/15/2014   HCT 43.5 04/15/2014   MCV 92.6 04/15/2014   PLT 198 04/15/2014   Lab Results  Component Value Date   CHOL 148 04/15/2014   HDL 58 04/15/2014   LDLCALC 74 04/15/2014   TRIG 80 04/15/2014   CHOLHDL 2.6 04/15/2014   ASSESSMENT/PLAN:  Pure hypercholesterolemia - borderline LDL; reinforced low  cholesterol diet.  continue lipitor 80 and plan recheck in 6 mos - Plan: Hepatic function panel, Lipid panel  Gout - no recent flares.  uric acid levels good on uloric, continue - Plan: febuxostat (ULORIC) 40 MG tablet  CAD (coronary artery disease) - stable  Essential hypertension, benign - well controlled  Lipids--borderline.  Goal <70.  Reviewed low cholesterol diet. copies of lipids sent to Dr. Irish Lack  Recommended to schedule colonoscopy--due (5 yrs, first degree family member with cancer)

## 2014-04-22 NOTE — Patient Instructions (Signed)
Continue all of your current medications. Try and watch the cholesterol in your diet when eating out.

## 2014-04-23 ENCOUNTER — Ambulatory Visit: Payer: Medicare Other | Admitting: Interventional Cardiology

## 2014-04-27 ENCOUNTER — Other Ambulatory Visit: Payer: Self-pay | Admitting: Cardiology

## 2014-04-27 DIAGNOSIS — E7800 Pure hypercholesterolemia, unspecified: Secondary | ICD-10-CM

## 2014-06-04 ENCOUNTER — Other Ambulatory Visit: Payer: Self-pay

## 2014-06-04 MED ORDER — ATORVASTATIN CALCIUM 80 MG PO TABS: 80.0000 mg | ORAL_TABLET | Freq: Every day | ORAL | Status: DC

## 2014-09-23 ENCOUNTER — Other Ambulatory Visit (INDEPENDENT_AMBULATORY_CARE_PROVIDER_SITE_OTHER): Payer: Medicare Other

## 2014-09-23 DIAGNOSIS — Z23 Encounter for immunization: Secondary | ICD-10-CM

## 2014-11-09 ENCOUNTER — Other Ambulatory Visit: Payer: Self-pay | Admitting: Family Medicine

## 2014-11-11 ENCOUNTER — Other Ambulatory Visit: Payer: Medicare Other

## 2014-11-11 ENCOUNTER — Encounter: Payer: Medicare Other | Admitting: Family Medicine

## 2014-11-11 DIAGNOSIS — E7800 Pure hypercholesterolemia, unspecified: Secondary | ICD-10-CM

## 2014-11-11 LAB — LIPID PANEL
CHOL/HDL RATIO: 2.3 ratio
Cholesterol: 145 mg/dL (ref 0–200)
HDL: 62 mg/dL (ref >39)
LDL Cholesterol: 62 mg/dL (ref 0–99)
Triglycerides: 104 mg/dL (ref <150)
VLDL: 21 mg/dL (ref 0–40)

## 2014-11-11 LAB — HEPATIC FUNCTION PANEL
ALT: 21 U/L (ref 0–53)
AST: 23 U/L (ref 0–37)
Albumin: 3.8 g/dL (ref 3.5–5.2)
Alkaline Phosphatase: 45 U/L (ref 39–117)
BILIRUBIN TOTAL: 1.3 mg/dL — AB (ref 0.2–1.2)
Bilirubin, Direct: 0.3 mg/dL (ref 0.0–0.3)
Indirect Bilirubin: 1 mg/dL (ref 0.2–1.2)
Total Protein: 6.3 g/dL (ref 6.0–8.3)

## 2014-11-18 ENCOUNTER — Encounter: Payer: Self-pay | Admitting: Family Medicine

## 2014-11-18 ENCOUNTER — Ambulatory Visit (INDEPENDENT_AMBULATORY_CARE_PROVIDER_SITE_OTHER): Payer: Medicare Other | Admitting: Family Medicine

## 2014-11-18 VITALS — BP 130/72 | HR 72 | Ht 71.5 in | Wt 168.0 lb

## 2014-11-18 DIAGNOSIS — Z125 Encounter for screening for malignant neoplasm of prostate: Secondary | ICD-10-CM

## 2014-11-18 DIAGNOSIS — M109 Gout, unspecified: Secondary | ICD-10-CM

## 2014-11-18 DIAGNOSIS — E7800 Pure hypercholesterolemia, unspecified: Secondary | ICD-10-CM

## 2014-11-18 DIAGNOSIS — Z0000 Encounter for general adult medical examination without abnormal findings: Secondary | ICD-10-CM

## 2014-11-18 DIAGNOSIS — I10 Essential (primary) hypertension: Secondary | ICD-10-CM

## 2014-11-18 DIAGNOSIS — Z5181 Encounter for therapeutic drug level monitoring: Secondary | ICD-10-CM

## 2014-11-18 LAB — POCT URINALYSIS DIPSTICK
Bilirubin, UA: NEGATIVE
Blood, UA: NEGATIVE
Glucose, UA: NEGATIVE
KETONES UA: NEGATIVE
LEUKOCYTES UA: NEGATIVE
Nitrite, UA: NEGATIVE
PROTEIN UA: NEGATIVE
Spec Grav, UA: 1.015
UROBILINOGEN UA: NEGATIVE
pH, UA: 6.5

## 2014-11-18 NOTE — Progress Notes (Signed)
Chief Complaint  Patient presents with  . Annual Exam    nonfasting annual exam/med check. Labs already done. Has being having an occasional SOB x 2-3 weeks.    Bradley Terrell is a 73 y.o. male who presents for a complete physical.  He has the following concerns:  Very short episodes where he is short of breath--at rest or with exercise, lasts about a minute.  No associated palpitations, chest presure, pain.  Feels like he needs to take a deep breath and relax--doesn't stop what he is doing.  Possibly anxiety.  Hyperlipidemia follow-up: Compliant with medications and denies medication side effects.Diet is worse in the Spring (baseball season), better now.  Review of chart shows the fluctuations in his lipids seasonally, always higher in April/May than in October/November. His Vytorin was changed to Lipitor 80mg  in the Spring by Dr. Irish Lack. He had some shortness of breath that they felt might be related to the Vytorin, and also for cost purposes. He is tolerating the Lipitor well without side effects.  HTN: Blood pressures have remained normal elsewhere. Denies cough, headaches, dizziness, chest pain.   CAD:  He last saw Dr. Irish Lack in March.  He remains asymptomatic. Compliant with medications  Gout: Hasn't had any flares. Remains on uloric. (previously tried stopping HCT so that uric acid would be lower to possibly decrease need for uloric, but BP's didn't do as well without HCT).   Inguinal hernias--remain asymptomatic.   Immunization History  Administered Date(s) Administered  . Influenza Split 09/17/2012  . Influenza Whole 08/25/2013  . Influenza, High Dose Seasonal PF 09/23/2014  . Pneumococcal Conjugate-13 03/10/2005, 10/22/2013  . Tdap 01/10/2009  . Zoster 01/10/2005   Last colonoscopy: 05/2014 with Dr. Amedeo Plenty (diverticulosis). Last PSA: 04/2014 Dentist: twice yearly  Ophtho: yearly--past due, needs to find new eye doctor Exercise: Yardwork. Walks some.  Past  Medical History  Diagnosis Date  . CAD (coronary artery disease) 1991    Dr. Irish Lack.  atherectomy from RCA and LAD '91  . HTN (hypertension)   . Renal cyst     left  . Gilbert syndrome     elevated indirect bilirubin  . Wheezing     with beta blockers (per Dr. Don Broach notes)  . Inguinal hernia     R>L, easily reducible  . Wheezing     WITH B-BLOCKERS    Past Surgical History  Procedure Laterality Date  . Tonsillectomy  age 47  . Cardiac atherectomy  '91    to LAD and RCA  . Prk  '96    (refractive eye surgery)  . Colonoscopy  03/2009, 05/2014    History   Social History  . Marital Status: Married    Spouse Name: N/A    Number of Children: 49  . Years of Education: N/A   Occupational History  . 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 Smoking status: Never Smoker   . Smokeless tobacco: Never Used  . Alcohol Use: 0.0 oz/week    0 Not specified per week     Comment: bourbon and ginger daily, wine with dinner, stinger in the evening.  . Drug Use: No  . Sexual Activity: Not on file   Other Topics Concern  . Not on file   Social History Narrative   Married, 1 dog, 1 cats.  Son in [place] (has terminal colon cancer), 3 in [place]. 7 grandchildren, 1 great-grandchild   Lost job 07/2014.    Family History  Problem Relation Age of Onset  . Cancer Mother     breast  . Hypertension Father   . Heart disease Father   . Cancer Sister     pancreatic cancer  . Heart disease Brother 47    CABG  . Cancer Brother     colon  . Colon cancer Brother     late 58's  . Cancer Son 31    colon cancer, recurred at 24 (metastatic)  . Colon cancer Son   . Diabetes Neg Hx   . Cancer Brother 69    lung cancer, smoker  . Parkinson's disease Sister   . Hypertension Sister   . Cancer Other     pancreatic    Outpatient Encounter Prescriptions as of 11/18/2014  Medication Sig Note  . atorvastatin (LIPITOR) 80 MG tablet Take 1 tablet (80 mg total) by mouth  daily.   . Biotin 5000 MCG CAPS Take 1 capsule by mouth daily.   . Calcium Carbonate-Vit D-Min (CALCIUM 600+D PLUS MINERALS) 600-400 MG-UNIT TABS Take 1 tablet by mouth daily.     . cholecalciferol (VITAMIN D) 1000 UNITS tablet Take 1,000 Units by mouth daily.   . febuxostat (ULORIC) 40 MG tablet Take 1 tablet (40 mg total) by mouth daily.   . fish oil-omega-3 fatty acids 1000 MG capsule Take 1 g by mouth daily.     . Glucosamine 500 MG CAPS Take 1 capsule by mouth daily.    Marland Kitchen lisinopril-hydrochlorothiazide (PRINZIDE,ZESTORETIC) 20-12.5 MG per tablet Take 2 tablets by mouth  daily   . Multiple Vitamins-Minerals (CENTRUM SILVER PO) Take 1 tablet by mouth daily.     . Probiotic Product (PROBIOTIC DAILY PO) Take by mouth.   . saw palmetto 160 MG capsule Take 160 mg by mouth daily.    . vitamin E 100 UNIT capsule Take 100 Units by mouth daily.   . Zinc Sulfate (ZINC 15 PO) Take 1 tablet by mouth daily.     . NON FORMULARY raisen soaked in gin 11/18/2014: Ran out of raisins    No Known Allergies  ROS: The patient denies anorexia, fever, weight changes, headaches, vision loss, decreased hearing, ear pain, hoarseness, chest pain, palpitations, dizziness, syncope, dyspnea on exertion, cough, swelling, nausea, vomiting, diarrhea, constipation, abdominal pain, melena, hematochezia, indigestion/heartburn, hematuria, incontinence, erectile dysfunction, nocturia, weakened urine stream, dysuria, genital lesions, joint pains, numbness, tingling, weakness, tremor, suspicious skin lesions, depression, anxiety, abnormal bleeding/bruising, or enlarged lymph nodes. Sees Dr. Jari Pigg yearly. Some short-lived shortness of breath as per HPI, recent.    PHYSICAL EXAM:  BP 130/72 mmHg  Pulse 72  Ht 5' 11.5" (1.816 m)  Wt 168 lb (76.204 kg)  BMI 23.11 kg/m2  General Appearance:  Alert, cooperative, no distress, appears stated age   Head:  Normocephalic, without obvious abnormality, atraumatic   Eyes:   PERRL, conjunctiva/corneas clear, EOM's intact, fundi  benign   Ears:  Normal TM's and external ear canals   Nose:  Nares normal, mucosa normal, no drainage or sinus tenderness   Throat:  Lips, mucosa, and tongue normal; teeth and gums normal   Neck:  Supple, no lymphadenopathy; thyroid: no enlargement/tenderness/nodules; no carotid  bruit or JVD   Back:  Spine nontender, no curvature, ROM normal, no CVA tenderness   Lungs:  Clear to auscultation bilaterally without wheezes, rales or ronchi; respirations unlabored   Chest Wall:  No tenderness or deformity   Heart:  Regular rate and rhythm, S1 and S2 normal, no murmur,  rub  or gallop   Breast Exam:  No chest wall tenderness, masses or gynecomastia   Abdomen:  Soft, non-tender, nondistended, normoactive bowel sounds,  no masses, no hepatosplenomegaly   Genitalia:  Normal male external genitalia without lesions. Testicles without masses. R>L inguinal hernias present, easily reducible, nontender   Rectal:  Normal sphincter tone, no masses or tenderness; guaiac negative stool. Prostate smooth, no nodules, not enlarged.   Extremities:  No clubbing, cyanosis or edema. Thickened onychomycotic toenails   Pulses:  2+ and symmetric all extremities   Skin:  Skin color, texture, turgor normal, no rashes or lesions. Actinic skin changes throughout  Lymph nodes:  Cervical, supraclavicular, and axillary nodes normal   Neurologic:  CNII-XII intact, normal strength, sensation and gait; reflexes 2+ and symmetric throughout    Psych:  Normal mood, affect, hygiene and grooming.    Lab Results  Component Value Date   ALT 21 11/11/2014   AST 23 11/11/2014   ALKPHOS 45 11/11/2014   BILITOT 1.3* 11/11/2014   Lab Results  Component Value Date   CHOL 145 11/11/2014   HDL 62 11/11/2014   LDLCALC 62 11/11/2014   TRIG 104 11/11/2014   CHOLHDL 2.3 11/11/2014   Lab Results  Component Value Date    PSA 0.57 04/15/2014   PSA 0.67 04/03/2013    ASSESSMENT/PLAN:  Annual physical exam - Plan: Visual acuity screening, POCT Urinalysis Dipstick  Gout without tophus, unspecified cause, unspecified chronicity, unspecified site - no flares; doing well on uloric  Pure hypercholesterolemia - at goal on Lipitor  Essential hypertension, benign - well controlled  Discussed PSA screening (risks/benefits), recommended at least 30 minutes of aerobic activity at least 5 days/week, weight-bearing exercise; proper sunscreen use reviewed; healthy diet and alcohol recommendations (less than or equal to 2 drinks/day) reviewed; regular seatbelt use; changing batteries in smoke detectors. Self-testicular exams. Immunization recommendations discussed, UTD. Colonoscopy recommendations reviewed, UTD.  Encouraged cutting back on alcohol intake.  Pt reports having advanced directives and healthcare power of attorney--needs to get it notarized.  SOB--?anxiety. Return if increasing frequency/severity of symptoms. Discussed ways to keep busy, while waiting to find job (volunteering, exercise, p/t jobs, etc)  F/u 6 months with fasting labs prior

## 2014-11-18 NOTE — Patient Instructions (Signed)

## 2015-01-21 ENCOUNTER — Other Ambulatory Visit: Payer: Self-pay | Admitting: Family Medicine

## 2015-02-23 DIAGNOSIS — H2513 Age-related nuclear cataract, bilateral: Secondary | ICD-10-CM | POA: Diagnosis not present

## 2015-02-23 DIAGNOSIS — H524 Presbyopia: Secondary | ICD-10-CM | POA: Diagnosis not present

## 2015-02-23 DIAGNOSIS — H11003 Unspecified pterygium of eye, bilateral: Secondary | ICD-10-CM | POA: Diagnosis not present

## 2015-03-03 ENCOUNTER — Encounter: Payer: Self-pay | Admitting: Interventional Cardiology

## 2015-03-03 ENCOUNTER — Ambulatory Visit (INDEPENDENT_AMBULATORY_CARE_PROVIDER_SITE_OTHER): Payer: Medicare Other | Admitting: Interventional Cardiology

## 2015-03-03 VITALS — BP 138/80 | HR 76 | Ht 71.0 in | Wt 165.0 lb

## 2015-03-03 DIAGNOSIS — I10 Essential (primary) hypertension: Secondary | ICD-10-CM | POA: Diagnosis not present

## 2015-03-03 DIAGNOSIS — R0602 Shortness of breath: Secondary | ICD-10-CM | POA: Diagnosis not present

## 2015-03-03 DIAGNOSIS — E7800 Pure hypercholesterolemia, unspecified: Secondary | ICD-10-CM

## 2015-03-03 DIAGNOSIS — I2510 Atherosclerotic heart disease of native coronary artery without angina pectoris: Secondary | ICD-10-CM | POA: Diagnosis not present

## 2015-03-03 MED ORDER — NITROGLYCERIN 0.4 MG SL SUBL: 0.4000 mg | SUBLINGUAL_TABLET | SUBLINGUAL | Status: DC | PRN

## 2015-03-03 MED ORDER — CARVEDILOL 3.125 MG PO TABS: 3.1250 mg | ORAL_TABLET | Freq: Two times a day (BID) | ORAL | Status: DC

## 2015-03-03 NOTE — Progress Notes (Signed)
Patient ID: Reginaldo Hazard, male   DOB: 1941-12-06, 74 y.o.   MRN: [id number] Patient ID: Muzamil Harker, male   DOB: 11-19-1941, 74 y.o.   MRN: [id number]    [place], [address] [place], [place]  [address] Phone: [phone] Fax:  [phone]  Date:  03/03/2015   ID:  Errol Ala, DOB Jul 04, 1941, MRN [id number]  PCP:  Vikki Ports, MD      History of Present Illness: Myrle Dues is a 74 y.o. male  who had CAD. HE had a 2 vessel intervention in [place] in 1991. He has done well.  He had been not walking the treadmill.  . Worked 4 days a week, but has cut back. He walks a lot at work. He does yard work. No sx with that.  He walks outside more frequwently and has noted that when he walks up hill, he has some SHOB and right shoulder pain.  He has had SHOB during intercourse.  Somewhat atypical compared to prior angina. CAD/ASCVD:  Denies : Dizziness.  Leg edema.  Nitroglycerin.  Orthopnea.  Palpitations.  Paroxysmal nocturnal dyspnea.  Syncope.  Sx at the time of his CAD was a tightness of the chest. It felt like a bear hug.     SHOB is occuring anytime with activity, 5 days/ week.  He does not have NTG.  He has not used it in the past.      Wt Readings from Last 3 Encounters:  03/03/15 165 lb (74.844 kg)  11/18/14 168 lb (76.204 kg)  04/22/14 166 lb (75.297 kg)     Past Medical History  Diagnosis Date  . CAD (coronary artery disease) 1991    Dr. Irish Lack.  atherectomy from RCA and LAD '91  . HTN (hypertension)   . Renal cyst     left  . Gilbert syndrome     elevated indirect bilirubin  . Wheezing     with beta blockers (per Dr. Don Broach notes)  . Inguinal hernia     R>L, easily reducible  . Wheezing     WITH B-BLOCKERS    Current Outpatient Prescriptions  Medication Sig Dispense Refill  . atorvastatin (LIPITOR) 80 MG tablet Take 1 tablet (80 mg total) by mouth daily. 90 tablet 3  . Biotin 5000 MCG CAPS Take 1 capsule by mouth daily.    . Calcium  Carbonate-Vit D-Min (CALCIUM 600+D PLUS MINERALS) 600-400 MG-UNIT TABS Take 1 tablet by mouth daily.      . cholecalciferol (VITAMIN D) 1000 UNITS tablet Take 1,000 Units by mouth daily.    . fish oil-omega-3 fatty acids 1000 MG capsule Take 1 g by mouth daily.      . Glucosamine 500 MG CAPS Take 1 capsule by mouth daily.     Marland Kitchen lisinopril-hydrochlorothiazide (PRINZIDE,ZESTORETIC) 20-12.5 MG per tablet Take 2 tablets by mouth  daily 180 tablet 0  . Multiple Vitamins-Minerals (CENTRUM SILVER PO) Take 1 tablet by mouth daily.      . NON FORMULARY raisen soaked in gin    . Probiotic Product (PROBIOTIC DAILY PO) Take by mouth.    . saw palmetto 160 MG capsule Take 160 mg by mouth daily.     Marland Kitchen ULORIC 40 MG tablet Take 1 tablet by mouth  daily 90 tablet 0  . vitamin E 100 UNIT capsule Take 100 Units by mouth daily.    . Zinc Sulfate (ZINC 15 PO) Take 1 tablet by mouth daily.       No  current facility-administered medications for this visit.    Allergies:   No Known Allergies  Social History:  The patient  reports that he has never smoked. He has never used smokeless tobacco. He reports that he drinks alcohol. He reports that he does not use illicit drugs.   Family History:  The patient's family history includes Cancer in his brother, mother, other, and sister; Cancer (age of onset: 29) in his son; Cancer (age of onset: 15) in his brother; Colon cancer in his brother and son; Heart disease in his father; Heart disease (age of onset: 73) in his brother; Hypertension in his father and sister; Parkinson's disease in his sister. There is no history of Diabetes.   ROS:  Please see the history of present illness.  No nausea, vomiting.  No fevers, chills.  No focal weakness.  No dysuria.    All other systems reviewed and negative.   PHYSICAL EXAM: VS:  BP 138/80 mmHg  Pulse 76  Ht 5\' 11"  (1.803 m)  Wt 165 lb (74.844 kg)  BMI 23.02 kg/m2  SpO2 98% Well nourished, well developed, in no acute  distress HEENT: normal Neck: no JVD, no carotid bruits Cardiac:  normal S1, S2; RRR;  Lungs:  clear to auscultation bilaterally, no wheezing, rhonchi or rales Abd: soft, nontender, no hepatomegaly Ext: no edema, 2+ right radial pulse Skin: warm and dry Neuro:   no focal abnormalities noted Psych: normal affect  EKG:  NSR, LAD     ASSESSMENT AND PLAN:  Coronary atherosclerosis of native coronary artery  Continue Aspirin Tablet, 81 MG, 1 tab, Orally, qd       Notes: SHOB nad shoulder pain concerning for angina. S/p Tarnov in 1991. He had not been taking aspirin regularly last year, but is now better about this. SHOB intermittentl, but routinely with exercise.  Negative nuclear stress test in 2013.  Discussed cath versus repeat stress test. He would prefer to have a repeat stress test. I did explain that if his stress test is abnormal, he will need a cath. If his symptoms persist, he will likely need a cath as well.  Give Rx for SL NTG.    2. Essential hypertension, benign  Continue Lisinopril-Hydrochlorothiazide Tablet, 20-12.5 MG, 2 tablets, Orally, Once a day Notes: COntrolled. Checking BP at home. He has had consistently elevated readings. The lowest are in the 120s but he has many readings over 341 systolic. Given the concern for angina, will start carvedilol 3.125 mg by mouth twice a day.  This can be uptitrated.    3. Hyperlipidemia  Continue Fish Oil Capsule, 1000 mg, 1 tab, Orally, bid Stopped Vytorin Tablet, 10-80 MG, 1 tablet, Orally, Once a day  Due to cost, $5/day Notes:  Started atorvastatin 80 mg daily. LDL 62 in 12/15.  TOlerating this well.         Signed, Mina Marble, MD, [facility] 03/03/2015 9:11 AM

## 2015-03-03 NOTE — Patient Instructions (Signed)
Your physician has recommended you make the following change in your medication:  1) START Nitro as needed for Chest pain 2) START Coreg 3.125 mg twice a day with meals  Your physician has requested that you have en exercise stress myoview. For further information please visit [email or web address]. Please follow instruction sheet, as given.  Your physician recommends that you schedule a follow-up appointment in: 6 weeks with Dr. Irish Lack.

## 2015-03-15 ENCOUNTER — Telehealth: Payer: Self-pay | Admitting: Cardiology

## 2015-03-15 ENCOUNTER — Ambulatory Visit (HOSPITAL_COMMUNITY): Payer: Medicare Other | Attending: Cardiovascular Disease | Admitting: Radiology

## 2015-03-15 DIAGNOSIS — R9439 Abnormal result of other cardiovascular function study: Secondary | ICD-10-CM

## 2015-03-15 DIAGNOSIS — I2510 Atherosclerotic heart disease of native coronary artery without angina pectoris: Secondary | ICD-10-CM | POA: Insufficient documentation

## 2015-03-15 DIAGNOSIS — R0602 Shortness of breath: Secondary | ICD-10-CM | POA: Diagnosis not present

## 2015-03-15 MED ORDER — TECHNETIUM TC 99M SESTAMIBI GENERIC - CARDIOLITE
30.0000 | Freq: Once | INTRAVENOUS | Status: AC | PRN
  Administered 2015-03-15: 30 via INTRAVENOUS

## 2015-03-15 MED ORDER — TECHNETIUM TC 99M SESTAMIBI GENERIC - CARDIOLITE
11.0000 | Freq: Once | INTRAVENOUS | Status: AC | PRN
  Administered 2015-03-15: 11 via INTRAVENOUS

## 2015-03-15 NOTE — Progress Notes (Signed)
[facility] [address] [place], [place] [address] [phone]    Cardiology Nuclear Med Study  Bradley Terrell is a 74 y.o. male     MRN : [id number]     DOB: [birth year]  Procedure Date: 03/15/2015  Nuclear Med Background Indication for Stress Test:  Evaluation for Ischemia History:  CAD, MPI 2013 (normal) Cardiac Risk Factors: Hypertension and Lipids  Symptoms:  DOE and Shoulder pain with exertion   Nuclear Pre-Procedure Caffeine/Decaff Intake:  None NPO After: 7:00pm   Lungs:  clear O2 Sat: 98% on room air. IV 0.9% NS with Angio Cath:  22g  IV Site: R Hand  IV Started by:  Matilde Haymaker, RN  Chest Size (in):  42 Cup Size: n/a  Height: 5\' 11"  (1.803 m)  Weight:  168 lb (76.204 kg)  BMI:  Body mass index is 23.44 kg/(m^2). Tech Comments:  n/a    Nuclear Med Study 1 or 2 day study: 1 day  Stress Test Type:  Stress  Reading MD: n/a  Order Authorizing Provider: Rowan Blase  Resting Radionuclide: Technetium 37m Sestamibi  Resting Radionuclide Dose: 11.0 mCi   Stress Radionuclide:  Technetium 23m Sestamibi  Stress Radionuclide Dose: 33.0 mCi           Stress Protocol Rest HR: 62 Stress HR: 125  Rest BP: 149/86 Stress BP: 161/70  Exercise Time (min): 8:00 METS: 10.0   Predicted Max HR: 145 bpm % Max HR: 86.21 bpm Rate Pressure Product: 20125   Dose of Adenosine (mg):  n/a Dose of Lexiscan: n/a mg  Dose of Atropine (mg): n/a Dose of Dobutamine: n/a mcg/kg/min (at max HR)  Stress Test Technologist: Glade Lloyd, BS-ES  Nuclear Technologist:  Earl Many, CNMT     Rest Procedure:  Myocardial perfusion imaging was performed at rest 45 minutes following the intravenous administration of Technetium 17m Sestamibi. Rest ECG: NSR - Normal EKG  Stress Procedure:  The patient exercised on the treadmill utilizing the Bruce Protocol for 8:00 minutes. The patient stopped due to ST changes, 6/10 chest tightness, increasing SOB and  3/10 right elbow pain.  Technetium 4m Sestamibi was injected at peak exercise and myocardial perfusion imaging was performed after a brief delay.  Stress ECG: There was 2-74mm of horizontal ST segment depression in the inferolateral leads at peak exercise.  The ST segments become downsloping in recovery with inverted T waves.   QPS Raw Data Images:  Normal; no motion artifact; normal heart/lung ratio. Stress Images:  medium sized, moderate intensity defect in the inferior wall and apex.  Small in size, medium in intensity defect in the basal and mid inferolateral wall.   Rest Images:  medium sized, moderate intensity defect in the inferior wall and apex.  Small in size, medium in intensity defect in the basal and mid inferolateral wall. Subtraction (SDS):  medium sized, moderate, partially reversible defect in the inferior wall and apex.  Small in size, medium in intensity fixed defect in the basal and mid inferolateral wall.   Transient Ischemic Dilatation (Normal <1.22):  1.05 Lung/Heart Ratio (Normal <0.45):  0.20  Quantitative Gated Spect Images QGS EDV:  102 ml QGS ESV:  50 ml  Impression Exercise Capacity:  Good exercise capacity. BP Response:  Normal blood pressure response. Clinical Symptoms:  Mild chest pain/dyspnea. ECG Impression:  Significant ST abnormalities consistent with ischemia. Comparison with Prior Nuclear Study: No images to compare  Overall Impression:  High risk stress nuclear  study with a medium sized, moderate intensity partially reversible defect in the inferior wall and apex.  Small in size, medium in intensity fixed defect in the basal and mid inferolateral wall.  Markedly positive EKG for ischemia with typical chest pain and SOB.    LV Ejection Fraction: 51%.  LV Wall Motion:  Low NL LV Function; NL Wall Motion  Signed: Fransico Him, MD [facility] 03/15/2015

## 2015-03-15 NOTE — Telephone Encounter (Signed)
Patient had very abnormal stress test with ischemia in the inferior wall and apex and markedly positive EKG for ischemia

## 2015-03-16 ENCOUNTER — Encounter: Payer: Self-pay | Admitting: *Deleted

## 2015-03-16 ENCOUNTER — Other Ambulatory Visit (INDEPENDENT_AMBULATORY_CARE_PROVIDER_SITE_OTHER): Payer: Medicare Other | Admitting: *Deleted

## 2015-03-16 DIAGNOSIS — Z5181 Encounter for therapeutic drug level monitoring: Secondary | ICD-10-CM

## 2015-03-16 DIAGNOSIS — R9439 Abnormal result of other cardiovascular function study: Secondary | ICD-10-CM | POA: Diagnosis not present

## 2015-03-16 LAB — CBC WITH DIFFERENTIAL/PLATELET
Basophils Absolute: 0 10*3/uL (ref 0.0–0.1)
Basophils Relative: 0.3 % (ref 0.0–3.0)
EOS PCT: 1.4 % (ref 0.0–5.0)
Eosinophils Absolute: 0.1 10*3/uL (ref 0.0–0.7)
HEMATOCRIT: 43.1 % (ref 39.0–52.0)
Hemoglobin: 14.9 g/dL (ref 13.0–17.0)
Lymphocytes Relative: 30.7 % (ref 12.0–46.0)
Lymphs Abs: 1.8 10*3/uL (ref 0.7–4.0)
MCHC: 34.5 g/dL (ref 30.0–36.0)
MCV: 94.2 fl (ref 78.0–100.0)
Monocytes Absolute: 0.8 10*3/uL (ref 0.1–1.0)
Monocytes Relative: 13.5 % — ABNORMAL HIGH (ref 3.0–12.0)
Neutro Abs: 3.1 10*3/uL (ref 1.4–7.7)
Neutrophils Relative %: 54.1 % (ref 43.0–77.0)
PLATELETS: 181 10*3/uL (ref 150.0–400.0)
RBC: 4.57 Mil/uL (ref 4.22–5.81)
RDW: 13 % (ref 11.5–15.5)
WBC: 5.8 10*3/uL (ref 4.0–10.5)

## 2015-03-16 LAB — BASIC METABOLIC PANEL
BUN: 15 mg/dL (ref 6–23)
CALCIUM: 9.7 mg/dL (ref 8.4–10.5)
CHLORIDE: 101 meq/L (ref 96–112)
CO2: 29 meq/L (ref 19–32)
CREATININE: 1.15 mg/dL (ref 0.40–1.50)
GFR: 65.75 mL/min (ref >60.00)
GLUCOSE: 92 mg/dL (ref 70–99)
Potassium: 3.6 mEq/L (ref 3.5–5.1)
SODIUM: 138 meq/L (ref 135–145)

## 2015-03-16 LAB — PROTIME-INR
INR: 1 ratio (ref 0.8–1.0)
PROTHROMBIN TIME: 11.1 s (ref 9.6–13.1)

## 2015-03-16 NOTE — Telephone Encounter (Signed)
Abnormal stress test.  WOuld recommend cath.  Can we do on Friday?

## 2015-03-16 NOTE — Addendum Note (Signed)
Addended by: Thompson Grayer on: 03/16/2015 10:59 AM   Modules accepted: Orders

## 2015-03-16 NOTE — Telephone Encounter (Signed)
Spoke with pt and reviewed stress test results and recommendations from Dr. Irish Lack with him.  He would like to proceed with cath but would like to discuss with Dr. Irish Lack.  Cath arranged for March 18, 2015 at 9:00 with Dr. Irish Lack. Pt will come to office today for lab work.  I verbally went over cath instructions with him and will leave printed copy of instructions at front desk for pt to pick up when here for lab work today.

## 2015-03-17 NOTE — Telephone Encounter (Signed)
Spoke to patient on 03/16/15

## 2015-03-18 ENCOUNTER — Other Ambulatory Visit: Payer: Self-pay

## 2015-03-18 ENCOUNTER — Inpatient Hospital Stay (HOSPITAL_COMMUNITY): Payer: Medicare Other

## 2015-03-18 ENCOUNTER — Inpatient Hospital Stay (HOSPITAL_COMMUNITY)
Admission: RE | Admit: 2015-03-18 | Discharge: 2015-03-23 | DRG: 234 | Disposition: A | Discharge status: Home / Self Care | Payer: Medicare Other | Source: Ambulatory Visit | Attending: Cardiothoracic Surgery | Admitting: Cardiothoracic Surgery

## 2015-03-18 ENCOUNTER — Encounter (HOSPITAL_COMMUNITY)
Admission: RE | Disposition: A | Discharge status: Home / Self Care | Payer: Medicare Other | Source: Ambulatory Visit | Attending: Cardiothoracic Surgery

## 2015-03-18 ENCOUNTER — Inpatient Hospital Stay (HOSPITAL_COMMUNITY): Payer: Medicare Other | Admitting: Anesthesiology

## 2015-03-18 ENCOUNTER — Encounter (HOSPITAL_COMMUNITY)
Admission: RE | Disposition: A | Discharge status: Home / Self Care | Payer: Self-pay | Source: Ambulatory Visit | Attending: Cardiothoracic Surgery

## 2015-03-18 ENCOUNTER — Encounter (HOSPITAL_COMMUNITY): Payer: Self-pay | Admitting: Anesthesiology

## 2015-03-18 DIAGNOSIS — I25119 Atherosclerotic heart disease of native coronary artery with unspecified angina pectoris: Secondary | ICD-10-CM

## 2015-03-18 DIAGNOSIS — Z820 Family history of epilepsy and other diseases of the nervous system: Secondary | ICD-10-CM | POA: Diagnosis not present

## 2015-03-18 DIAGNOSIS — D696 Thrombocytopenia, unspecified: Secondary | ICD-10-CM | POA: Diagnosis not present

## 2015-03-18 DIAGNOSIS — E119 Type 2 diabetes mellitus without complications: Secondary | ICD-10-CM | POA: Diagnosis not present

## 2015-03-18 DIAGNOSIS — Z7982 Long term (current) use of aspirin: Secondary | ICD-10-CM

## 2015-03-18 DIAGNOSIS — Z79899 Other long term (current) drug therapy: Secondary | ICD-10-CM | POA: Diagnosis not present

## 2015-03-18 DIAGNOSIS — I2510 Atherosclerotic heart disease of native coronary artery without angina pectoris: Secondary | ICD-10-CM | POA: Diagnosis not present

## 2015-03-18 DIAGNOSIS — D62 Acute posthemorrhagic anemia: Secondary | ICD-10-CM | POA: Diagnosis not present

## 2015-03-18 DIAGNOSIS — J9811 Atelectasis: Secondary | ICD-10-CM | POA: Diagnosis not present

## 2015-03-18 DIAGNOSIS — R9439 Abnormal result of other cardiovascular function study: Secondary | ICD-10-CM | POA: Diagnosis present

## 2015-03-18 DIAGNOSIS — Z951 Presence of aortocoronary bypass graft: Secondary | ICD-10-CM | POA: Diagnosis not present

## 2015-03-18 DIAGNOSIS — E785 Hyperlipidemia, unspecified: Secondary | ICD-10-CM | POA: Diagnosis not present

## 2015-03-18 DIAGNOSIS — R0602 Shortness of breath: Secondary | ICD-10-CM | POA: Diagnosis not present

## 2015-03-18 DIAGNOSIS — E8770 Fluid overload, unspecified: Secondary | ICD-10-CM | POA: Diagnosis not present

## 2015-03-18 DIAGNOSIS — I4891 Unspecified atrial fibrillation: Secondary | ICD-10-CM | POA: Diagnosis not present

## 2015-03-18 DIAGNOSIS — I25110 Atherosclerotic heart disease of native coronary artery with unstable angina pectoris: Secondary | ICD-10-CM | POA: Diagnosis not present

## 2015-03-18 DIAGNOSIS — I10 Essential (primary) hypertension: Secondary | ICD-10-CM | POA: Diagnosis not present

## 2015-03-18 DIAGNOSIS — I2584 Coronary atherosclerosis due to calcified coronary lesion: Secondary | ICD-10-CM | POA: Diagnosis not present

## 2015-03-18 DIAGNOSIS — Z8249 Family history of ischemic heart disease and other diseases of the circulatory system: Secondary | ICD-10-CM

## 2015-03-18 HISTORY — PX: ENDOVEIN HARVEST OF GREATER SAPHENOUS VEIN: SHX5059

## 2015-03-18 HISTORY — PX: INTRAOPERATIVE TRANSESOPHAGEAL ECHOCARDIOGRAM: SHX5062

## 2015-03-18 HISTORY — PX: CORONARY ARTERY BYPASS GRAFT: SHX141

## 2015-03-18 HISTORY — PX: LEFT HEART CATHETERIZATION WITH CORONARY ANGIOGRAM: SHX5451

## 2015-03-18 LAB — POCT I-STAT 3, ART BLOOD GAS (G3+)
Acid-Base Excess: 3 mmol/L — ABNORMAL HIGH (ref 0.0–2.0)
Acid-Base Excess: 1 mmol/L (ref 0.0–2.0)
BICARBONATE: 25.4 meq/L — AB (ref 20.0–24.0)
Bicarbonate: 27.2 mEq/L — ABNORMAL HIGH (ref 20.0–24.0)
Bicarbonate: 25.9 mEq/L — ABNORMAL HIGH (ref 20.0–24.0)
O2 SAT: 100 %
O2 Saturation: 100 %
O2 Saturation: 100 %
PCO2 ART: 31.9 mmHg — AB (ref 35.0–45.0)
PCO2 ART: 42 mmHg (ref 35.0–45.0)
PH ART: 7.522 — AB (ref 7.350–7.450)
PO2 ART: 410 mmHg — AB (ref 80.0–100.0)
PO2 ART: 160 mmHg — AB (ref 80.0–100.0)
Patient temperature: 33
Patient temperature: 37
TCO2: 28 mmol/L (ref 0–100)
TCO2: 27 mmol/L (ref 0–100)
TCO2: 27 mmol/L (ref 0–100)
pCO2 arterial: 36.1 mmHg (ref 35.0–45.0)
pH, Arterial: 7.39 (ref 7.350–7.450)
pH, Arterial: 7.454 — ABNORMAL HIGH (ref 7.350–7.450)
pO2, Arterial: 354 mmHg — ABNORMAL HIGH (ref 80.0–100.0)

## 2015-03-18 LAB — POCT I-STAT, CHEM 8
BUN: 12 mg/dL (ref 6–23)
BUN: 11 mg/dL (ref 6–23)
BUN: 11 mg/dL (ref 6–23)
BUN: 11 mg/dL (ref 6–23)
CHLORIDE: 100 mmol/L (ref 96–112)
CHLORIDE: 95 mmol/L — AB (ref 96–112)
CREATININE: 0.8 mg/dL (ref 0.50–1.35)
Calcium, Ion: 1.19 mmol/L (ref 1.13–1.30)
Calcium, Ion: 1.16 mmol/L (ref 1.13–1.30)
Calcium, Ion: 1.04 mmol/L — ABNORMAL LOW (ref 1.13–1.30)
Calcium, Ion: 1.02 mmol/L — ABNORMAL LOW (ref 1.13–1.30)
Chloride: 100 mmol/L (ref 96–112)
Chloride: 95 mmol/L — ABNORMAL LOW (ref 96–112)
Creatinine, Ser: 0.8 mg/dL (ref 0.50–1.35)
Creatinine, Ser: 0.7 mg/dL (ref 0.50–1.35)
Creatinine, Ser: 0.9 mg/dL (ref 0.50–1.35)
GLUCOSE: 98 mg/dL (ref 70–99)
Glucose, Bld: 105 mg/dL — ABNORMAL HIGH (ref 70–99)
Glucose, Bld: 96 mg/dL (ref 70–99)
Glucose, Bld: 124 mg/dL — ABNORMAL HIGH (ref 70–99)
HCT: 35 % — ABNORMAL LOW (ref 39.0–52.0)
HCT: 32 % — ABNORMAL LOW (ref 39.0–52.0)
HEMATOCRIT: 33 % — AB (ref 39.0–52.0)
HEMATOCRIT: 27 % — AB (ref 39.0–52.0)
HEMOGLOBIN: 11.2 g/dL — AB (ref 13.0–17.0)
Hemoglobin: 11.9 g/dL — ABNORMAL LOW (ref 13.0–17.0)
Hemoglobin: 9.2 g/dL — ABNORMAL LOW (ref 13.0–17.0)
Hemoglobin: 10.9 g/dL — ABNORMAL LOW (ref 13.0–17.0)
POTASSIUM: 3.4 mmol/L — AB (ref 3.5–5.1)
Potassium: 3.2 mmol/L — ABNORMAL LOW (ref 3.5–5.1)
Potassium: 4.3 mmol/L (ref 3.5–5.1)
Potassium: 4.2 mmol/L (ref 3.5–5.1)
Sodium: 139 mmol/L (ref 135–145)
Sodium: 138 mmol/L (ref 135–145)
Sodium: 133 mmol/L — ABNORMAL LOW (ref 135–145)
Sodium: 133 mmol/L — ABNORMAL LOW (ref 135–145)
TCO2: 25 mmol/L (ref 0–100)
TCO2: 25 mmol/L (ref 0–100)
TCO2: 24 mmol/L (ref 0–100)
TCO2: 23 mmol/L (ref 0–100)

## 2015-03-18 LAB — CREATININE, SERUM
Creatinine, Ser: 1.06 mg/dL (ref 0.50–1.35)
GFR calc Af Amer: 77 mL/min — ABNORMAL LOW (ref >90)
GFR calc non Af Amer: 67 mL/min — ABNORMAL LOW (ref >90)

## 2015-03-18 LAB — CBC
HCT: 28.1 % — ABNORMAL LOW (ref 39.0–52.0)
HEMATOCRIT: 29.8 % — AB (ref 39.0–52.0)
HEMOGLOBIN: 10.6 g/dL — AB (ref 13.0–17.0)
Hemoglobin: 9.9 g/dL — ABNORMAL LOW (ref 13.0–17.0)
MCH: 32.4 pg (ref 26.0–34.0)
MCH: 32.5 pg (ref 26.0–34.0)
MCHC: 35.6 g/dL (ref 30.0–36.0)
MCHC: 35.2 g/dL (ref 30.0–36.0)
MCV: 91.1 fL (ref 78.0–100.0)
MCV: 92.1 fL (ref 78.0–100.0)
Platelets: 102 10*3/uL — ABNORMAL LOW (ref 150–400)
Platelets: 96 10*3/uL — ABNORMAL LOW (ref 150–400)
RBC: 3.27 MIL/uL — AB (ref 4.22–5.81)
RBC: 3.05 MIL/uL — ABNORMAL LOW (ref 4.22–5.81)
RDW: 12.7 % (ref 11.5–15.5)
RDW: 12.9 % (ref 11.5–15.5)
WBC: 8.4 10*3/uL (ref 4.0–10.5)
WBC: 6.6 10*3/uL (ref 4.0–10.5)

## 2015-03-18 LAB — POCT I-STAT 3, VENOUS BLOOD GAS (G3P V)
ACID-BASE EXCESS: 1 mmol/L (ref 0.0–2.0)
BICARBONATE: 25.4 meq/L — AB (ref 20.0–24.0)
O2 Saturation: 87 %
PH VEN: 7.466 — AB (ref 7.250–7.300)
TCO2: 27 mmol/L (ref 0–100)
pCO2, Ven: 33.8 mmHg — ABNORMAL LOW (ref 45.0–50.0)
pO2, Ven: 39 mmHg (ref 30.0–45.0)

## 2015-03-18 LAB — MRSA PCR SCREENING: MRSA by PCR: NEGATIVE

## 2015-03-18 LAB — APTT: aPTT: 37 seconds (ref 24–37)

## 2015-03-18 LAB — PREPARE RBC (CROSSMATCH)

## 2015-03-18 LAB — PROTIME-INR
INR: 1.66 — ABNORMAL HIGH (ref 0.00–1.49)
Prothrombin Time: 19.8 seconds — ABNORMAL HIGH (ref 11.6–15.2)

## 2015-03-18 LAB — POCT I-STAT 4, (NA,K, GLUC, HGB,HCT)
GLUCOSE: 102 mg/dL — AB (ref 70–99)
HEMATOCRIT: 30 % — AB (ref 39.0–52.0)
HEMOGLOBIN: 10.2 g/dL — AB (ref 13.0–17.0)
POTASSIUM: 3.1 mmol/L — AB (ref 3.5–5.1)
Sodium: 140 mmol/L (ref 135–145)

## 2015-03-18 LAB — HEMOGLOBIN AND HEMATOCRIT, BLOOD
HCT: 30.2 % — ABNORMAL LOW (ref 39.0–52.0)
Hemoglobin: 11 g/dL — ABNORMAL LOW (ref 13.0–17.0)

## 2015-03-18 LAB — PLATELET COUNT: Platelets: 141 10*3/uL — ABNORMAL LOW (ref 150–400)

## 2015-03-18 LAB — ABO/RH: ABO/RH(D): A POS

## 2015-03-18 LAB — MAGNESIUM: Magnesium: 2.9 mg/dL — ABNORMAL HIGH (ref 1.5–2.5)

## 2015-03-18 SURGERY — CORONARY ARTERY BYPASS GRAFTING (CABG)
Anesthesia: General | Site: Leg Upper | Laterality: Right

## 2015-03-18 SURGERY — LEFT HEART CATHETERIZATION WITH CORONARY ANGIOGRAM

## 2015-03-18 MED ORDER — HYDRALAZINE HCL 20 MG/ML IJ SOLN
INTRAMUSCULAR | Status: DC | PRN
  Administered 2015-03-18 (×4): 2 mg via INTRAVENOUS

## 2015-03-18 MED ORDER — ATORVASTATIN CALCIUM 80 MG PO TABS
80.0000 mg | ORAL_TABLET | Freq: Every day | ORAL | Status: DC
  Administered 2015-03-19 – 2015-03-23 (×5): 80 mg via ORAL
  Filled 2015-03-18 (×6): qty 1

## 2015-03-18 MED ORDER — DEXTROSE 5 % IV SOLN
750.0000 mg | INTRAVENOUS | Status: DC
  Filled 2015-03-18 (×2): qty 750

## 2015-03-18 MED ORDER — LIDOCAINE HCL (PF) 1 % IJ SOLN
INTRAMUSCULAR | Status: AC
  Filled 2015-03-18: qty 30

## 2015-03-18 MED ORDER — LACTATED RINGERS IV SOLN
INTRAVENOUS | Status: DC | PRN
  Administered 2015-03-18 (×2): via INTRAVENOUS

## 2015-03-18 MED ORDER — DEXMEDETOMIDINE HCL IN NACL 400 MCG/100ML IV SOLN
0.1000 ug/kg/h | INTRAVENOUS | Status: AC
  Administered 2015-03-18: 0.3 ug/kg/h via INTRAVENOUS
  Filled 2015-03-18: qty 100

## 2015-03-18 MED ORDER — NITROGLYCERIN IN D5W 200-5 MCG/ML-% IV SOLN
2.0000 ug/min | INTRAVENOUS | Status: AC
  Administered 2015-03-18: 10 ug/min via INTRAVENOUS
  Filled 2015-03-18: qty 250

## 2015-03-18 MED ORDER — MORPHINE SULFATE 2 MG/ML IJ SOLN
2.0000 mg | INTRAMUSCULAR | Status: DC | PRN
  Administered 2015-03-19: 2 mg via INTRAVENOUS
  Filled 2015-03-18: qty 1

## 2015-03-18 MED ORDER — FAMOTIDINE IN NACL 20-0.9 MG/50ML-% IV SOLN
20.0000 mg | Freq: Two times a day (BID) | INTRAVENOUS | Status: AC
  Administered 2015-03-18 (×2): 20 mg via INTRAVENOUS
  Filled 2015-03-18: qty 50

## 2015-03-18 MED ORDER — METOPROLOL TARTRATE 12.5 MG HALF TABLET: 12.5000 mg | ORAL_TABLET | Freq: Once | ORAL | Status: DC

## 2015-03-18 MED ORDER — ONDANSETRON HCL 4 MG/2ML IJ SOLN: 4.0000 mg | Freq: Four times a day (QID) | INTRAMUSCULAR | Status: DC | PRN

## 2015-03-18 MED ORDER — HEPARIN SODIUM (PORCINE) 1000 UNIT/ML IJ SOLN
INTRAMUSCULAR | Status: AC
  Filled 2015-03-18: qty 1

## 2015-03-18 MED ORDER — DOPAMINE-DEXTROSE 3.2-5 MG/ML-% IV SOLN: 0.0000 ug/kg/min | INTRAVENOUS | Status: DC

## 2015-03-18 MED ORDER — POTASSIUM CHLORIDE 10 MEQ/50ML IV SOLN
10.0000 meq | Freq: Once | INTRAVENOUS | Status: AC
  Administered 2015-03-18: 10 meq via INTRAVENOUS

## 2015-03-18 MED ORDER — SODIUM CHLORIDE 0.9 % IJ SOLN: 3.0000 mL | INTRAMUSCULAR | Status: DC | PRN

## 2015-03-18 MED ORDER — PANTOPRAZOLE SODIUM 40 MG PO TBEC
40.0000 mg | DELAYED_RELEASE_TABLET | Freq: Every day | ORAL | Status: DC
  Administered 2015-03-20 – 2015-03-23 (×4): 40 mg via ORAL
  Filled 2015-03-18 (×4): qty 1

## 2015-03-18 MED ORDER — CEFUROXIME SODIUM 1.5 G IJ SOLR
1.5000 g | INTRAMUSCULAR | Status: AC
  Administered 2015-03-18: .75 g via INTRAVENOUS
  Administered 2015-03-18: 1.5 g via INTRAVENOUS
  Filled 2015-03-18: qty 1.5

## 2015-03-18 MED ORDER — DEXTROSE 5 % IV SOLN
750.0000 mg | INTRAVENOUS | Status: DC
  Filled 2015-03-18: qty 750

## 2015-03-18 MED ORDER — SODIUM CHLORIDE 0.9 % IV SOLN
INTRAVENOUS | Status: DC
  Filled 2015-03-18: qty 30

## 2015-03-18 MED ORDER — MAGNESIUM SULFATE 50 % IJ SOLN
40.0000 meq | INTRAMUSCULAR | Status: DC
  Filled 2015-03-18: qty 10

## 2015-03-18 MED ORDER — LACTATED RINGERS IV SOLN
INTRAVENOUS | Status: DC | PRN
  Administered 2015-03-18: 11:00:00 via INTRAVENOUS

## 2015-03-18 MED ORDER — METOPROLOL TARTRATE 25 MG/10 ML ORAL SUSPENSION
12.5000 mg | Freq: Two times a day (BID) | ORAL | Status: DC
  Filled 2015-03-18 (×3): qty 5

## 2015-03-18 MED ORDER — PHENYLEPHRINE HCL 10 MG/ML IJ SOLN
30.0000 ug/min | INTRAVENOUS | Status: AC
  Administered 2015-03-18: 15 ug/min via INTRAVENOUS
  Filled 2015-03-18: qty 2

## 2015-03-18 MED ORDER — ASPIRIN EC 325 MG PO TBEC
325.0000 mg | DELAYED_RELEASE_TABLET | Freq: Every day | ORAL | Status: DC
  Administered 2015-03-19 – 2015-03-23 (×5): 325 mg via ORAL
  Filled 2015-03-18 (×5): qty 1

## 2015-03-18 MED ORDER — VECURONIUM BROMIDE 10 MG IV SOLR
INTRAVENOUS | Status: DC | PRN
  Administered 2015-03-18 (×2): 5 mg via INTRAVENOUS
  Administered 2015-03-18: 10 mg via INTRAVENOUS

## 2015-03-18 MED ORDER — ARTIFICIAL TEARS OP OINT
TOPICAL_OINTMENT | OPHTHALMIC | Status: DC | PRN
  Administered 2015-03-18: 1 via OPHTHALMIC

## 2015-03-18 MED ORDER — FENTANYL CITRATE 0.05 MG/ML IJ SOLN
INTRAMUSCULAR | Status: AC
  Filled 2015-03-18: qty 5

## 2015-03-18 MED ORDER — SODIUM CHLORIDE 0.9 % IV SOLN
0.5000 g/h | Freq: Once | INTRAVENOUS | Status: DC
  Filled 2015-03-18: qty 20

## 2015-03-18 MED ORDER — HEMOSTATIC AGENTS (NO CHARGE) OPTIME
TOPICAL | Status: DC | PRN
  Administered 2015-03-18: 1 via TOPICAL

## 2015-03-18 MED ORDER — VANCOMYCIN HCL IN DEXTROSE 1-5 GM/200ML-% IV SOLN
1000.0000 mg | Freq: Once | INTRAVENOUS | Status: AC
  Administered 2015-03-19: 1000 mg via INTRAVENOUS
  Filled 2015-03-18: qty 200

## 2015-03-18 MED ORDER — SODIUM CHLORIDE 0.9 % IV SOLN
INTRAVENOUS | Status: DC
  Administered 2015-03-18: 07:00:00 via INTRAVENOUS

## 2015-03-18 MED ORDER — PROTAMINE SULFATE 10 MG/ML IV SOLN
INTRAVENOUS | Status: DC | PRN
  Administered 2015-03-18: 30 mg via INTRAVENOUS
  Administered 2015-03-18: 50 mg via INTRAVENOUS
  Administered 2015-03-18: 20 mg via INTRAVENOUS
  Administered 2015-03-18 (×2): 50 mg via INTRAVENOUS
  Administered 2015-03-18: 40 mg via INTRAVENOUS
  Administered 2015-03-18: 25 mg via INTRAVENOUS

## 2015-03-18 MED ORDER — PROTAMINE SULFATE 10 MG/ML IV SOLN
INTRAVENOUS | Status: AC
  Filled 2015-03-18: qty 25

## 2015-03-18 MED ORDER — SODIUM CHLORIDE 0.9 % IV SOLN: 10.0000 mL/h | Freq: Once | INTRAVENOUS | Status: DC

## 2015-03-18 MED ORDER — EPINEPHRINE HCL 1 MG/ML IJ SOLN
0.0000 ug/min | INTRAVENOUS | Status: DC
  Filled 2015-03-18: qty 4

## 2015-03-18 MED ORDER — LACTATED RINGERS IV SOLN
INTRAVENOUS | Status: DC | PRN
  Administered 2015-03-18 (×2): via INTRAVENOUS

## 2015-03-18 MED ORDER — FENTANYL CITRATE 0.05 MG/ML IJ SOLN
INTRAMUSCULAR | Status: DC | PRN
  Administered 2015-03-18: 50 ug via INTRAVENOUS
  Administered 2015-03-18 (×2): 100 ug via INTRAVENOUS
  Administered 2015-03-18: 250 ug via INTRAVENOUS
  Administered 2015-03-18: 800 ug via INTRAVENOUS
  Administered 2015-03-18: 25 ug via INTRAVENOUS
  Administered 2015-03-18: 250 ug via INTRAVENOUS
  Administered 2015-03-18: 25 ug via INTRAVENOUS
  Administered 2015-03-18: 250 ug via INTRAVENOUS

## 2015-03-18 MED ORDER — MIDAZOLAM HCL 2 MG/2ML IJ SOLN: 2.0000 mg | INTRAMUSCULAR | Status: DC | PRN

## 2015-03-18 MED ORDER — HEMOSTATIC AGENTS (NO CHARGE) OPTIME
TOPICAL | Status: DC | PRN
  Administered 2015-03-18 (×2): 1 via TOPICAL

## 2015-03-18 MED ORDER — METOPROLOL TARTRATE 1 MG/ML IV SOLN: 2.5000 mg | INTRAVENOUS | Status: DC | PRN

## 2015-03-18 MED ORDER — SODIUM CHLORIDE 0.9 % IV SOLN
INTRAVENOUS | Status: DC
  Filled 2015-03-18: qty 2.5

## 2015-03-18 MED ORDER — LACTATED RINGERS IV SOLN
INTRAVENOUS | Status: DC
  Administered 2015-03-18: 18:00:00 via INTRAVENOUS

## 2015-03-18 MED ORDER — ASPIRIN 81 MG PO CHEW
CHEWABLE_TABLET | ORAL | Status: AC
  Filled 2015-03-18: qty 1

## 2015-03-18 MED ORDER — NITROGLYCERIN IN D5W 200-5 MCG/ML-% IV SOLN: 2.0000 ug/min | INTRAVENOUS | Status: DC

## 2015-03-18 MED ORDER — ACETAMINOPHEN 500 MG PO TABS
1000.0000 mg | ORAL_TABLET | Freq: Four times a day (QID) | ORAL | Status: DC
  Administered 2015-03-19 – 2015-03-23 (×17): 1000 mg via ORAL
  Filled 2015-03-18 (×18): qty 2

## 2015-03-18 MED ORDER — TRAMADOL HCL 50 MG PO TABS: 50.0000 mg | ORAL_TABLET | ORAL | Status: DC | PRN

## 2015-03-18 MED ORDER — ROCURONIUM BROMIDE 100 MG/10ML IV SOLN
INTRAVENOUS | Status: DC | PRN
  Administered 2015-03-18: 50 mg via INTRAVENOUS

## 2015-03-18 MED ORDER — EPHEDRINE SULFATE 50 MG/ML IJ SOLN
INTRAMUSCULAR | Status: DC | PRN
  Administered 2015-03-18: 10 mg via INTRAVENOUS

## 2015-03-18 MED ORDER — MIDAZOLAM HCL 10 MG/2ML IJ SOLN
INTRAMUSCULAR | Status: AC
  Filled 2015-03-18: qty 2

## 2015-03-18 MED ORDER — LACTATED RINGERS IV SOLN: INTRAVENOUS | Status: DC

## 2015-03-18 MED ORDER — MORPHINE SULFATE 2 MG/ML IJ SOLN: 1.0000 mg | INTRAMUSCULAR | Status: AC | PRN

## 2015-03-18 MED ORDER — NITROGLYCERIN IN D5W 200-5 MCG/ML-% IV SOLN: 0.0000 ug/min | INTRAVENOUS | Status: DC

## 2015-03-18 MED ORDER — SODIUM CHLORIDE 0.9 % IJ SOLN: 10.0000 mL | INTRAMUSCULAR | Status: DC | PRN

## 2015-03-18 MED ORDER — DOPAMINE-DEXTROSE 3.2-5 MG/ML-% IV SOLN
0.0000 ug/kg/min | INTRAVENOUS | Status: DC
  Filled 2015-03-18: qty 250

## 2015-03-18 MED ORDER — PROTAMINE SULFATE 10 MG/ML IV SOLN
INTRAVENOUS | Status: AC
  Filled 2015-03-18: qty 5

## 2015-03-18 MED ORDER — ALBUMIN HUMAN 5 % IV SOLN
INTRAVENOUS | Status: DC | PRN
  Administered 2015-03-18 (×4): via INTRAVENOUS

## 2015-03-18 MED ORDER — SODIUM CHLORIDE 0.9 % IV SOLN
INTRAVENOUS | Status: DC
  Filled 2015-03-18: qty 40

## 2015-03-18 MED ORDER — SODIUM CHLORIDE 0.9 % IV SOLN: 250.0000 mL | INTRAVENOUS | Status: DC

## 2015-03-18 MED ORDER — ASPIRIN 81 MG PO CHEW: 324.0000 mg | CHEWABLE_TABLET | Freq: Every day | ORAL | Status: DC

## 2015-03-18 MED ORDER — PLASMA-LYTE 148 IV SOLN
INTRAVENOUS | Status: DC
  Filled 2015-03-18: qty 2.5

## 2015-03-18 MED ORDER — METOPROLOL TARTRATE 12.5 MG HALF TABLET
12.5000 mg | ORAL_TABLET | Freq: Two times a day (BID) | ORAL | Status: DC
  Filled 2015-03-18 (×3): qty 1

## 2015-03-18 MED ORDER — CETYLPYRIDINIUM CHLORIDE 0.05 % MT LIQD
7.0000 mL | Freq: Four times a day (QID) | OROMUCOSAL | Status: DC
  Administered 2015-03-19 (×2): 7 mL via OROMUCOSAL

## 2015-03-18 MED ORDER — CHLORHEXIDINE GLUCONATE 0.12 % MT SOLN
15.0000 mL | Freq: Two times a day (BID) | OROMUCOSAL | Status: DC
  Administered 2015-03-18 – 2015-03-19 (×2): 15 mL via OROMUCOSAL
  Filled 2015-03-18 (×2): qty 15

## 2015-03-18 MED ORDER — ALBUMIN HUMAN 5 % IV SOLN
250.0000 mL | INTRAVENOUS | Status: AC | PRN
  Administered 2015-03-18 – 2015-03-19 (×4): 250 mL via INTRAVENOUS
  Filled 2015-03-18 (×2): qty 250

## 2015-03-18 MED ORDER — FENTANYL CITRATE 0.05 MG/ML IJ SOLN
INTRAMUSCULAR | Status: AC
  Filled 2015-03-18: qty 2

## 2015-03-18 MED ORDER — BISACODYL 5 MG PO TBEC
10.0000 mg | DELAYED_RELEASE_TABLET | Freq: Every day | ORAL | Status: DC
  Administered 2015-03-19 – 2015-03-21 (×3): 10 mg via ORAL
  Filled 2015-03-18 (×3): qty 2

## 2015-03-18 MED ORDER — POTASSIUM CHLORIDE 2 MEQ/ML IV SOLN
80.0000 meq | INTRAVENOUS | Status: DC
  Filled 2015-03-18: qty 40

## 2015-03-18 MED ORDER — ACETAMINOPHEN 160 MG/5ML PO SOLN: 650.0000 mg | Freq: Once | ORAL | Status: DC

## 2015-03-18 MED ORDER — SODIUM CHLORIDE 0.9 % IJ SOLN
10.0000 mL | Freq: Two times a day (BID) | INTRAMUSCULAR | Status: DC
  Administered 2015-03-18 – 2015-03-21 (×5): 10 mL

## 2015-03-18 MED ORDER — MAGNESIUM SULFATE 4 GM/100ML IV SOLN
4.0000 g | Freq: Once | INTRAVENOUS | Status: AC
  Administered 2015-03-18: 4 g via INTRAVENOUS
  Filled 2015-03-18: qty 100

## 2015-03-18 MED ORDER — ACETAMINOPHEN 650 MG RE SUPP: 650.0000 mg | Freq: Once | RECTAL | Status: DC

## 2015-03-18 MED ORDER — DEXTROSE 5 % IV SOLN
30.0000 ug/min | INTRAVENOUS | Status: DC
  Filled 2015-03-18: qty 2

## 2015-03-18 MED ORDER — 0.9 % SODIUM CHLORIDE (POUR BTL) OPTIME
TOPICAL | Status: DC | PRN
  Administered 2015-03-18: 6000 mL

## 2015-03-18 MED ORDER — ACETAMINOPHEN 160 MG/5ML PO SOLN
1000.0000 mg | Freq: Four times a day (QID) | ORAL | Status: DC
  Filled 2015-03-18: qty 40

## 2015-03-18 MED ORDER — DEXMEDETOMIDINE HCL IN NACL 200 MCG/50ML IV SOLN
0.0000 ug/kg/h | INTRAVENOUS | Status: DC
  Administered 2015-03-18: 0.5 ug/kg/h via INTRAVENOUS
  Filled 2015-03-18: qty 50

## 2015-03-18 MED ORDER — POTASSIUM CHLORIDE 10 MEQ/50ML IV SOLN
10.0000 meq | INTRAVENOUS | Status: AC
  Administered 2015-03-18 – 2015-03-20 (×4): 10 meq via INTRAVENOUS

## 2015-03-18 MED ORDER — MIDAZOLAM HCL 5 MG/5ML IJ SOLN
INTRAMUSCULAR | Status: DC | PRN
  Administered 2015-03-18: 1 mg via INTRAVENOUS
  Administered 2015-03-18: 4 mg via INTRAVENOUS
  Administered 2015-03-18: 2 mg via INTRAVENOUS
  Administered 2015-03-18: 1 mg via INTRAVENOUS
  Administered 2015-03-18: 2 mg via INTRAVENOUS

## 2015-03-18 MED ORDER — DEXTROSE 5 % IV SOLN
0.0000 ug/min | INTRAVENOUS | Status: DC
  Filled 2015-03-18: qty 4

## 2015-03-18 MED ORDER — SODIUM CHLORIDE 0.9 % IJ SOLN
OROMUCOSAL | Status: DC | PRN
  Administered 2015-03-18 (×4): via TOPICAL

## 2015-03-18 MED ORDER — SODIUM CHLORIDE 0.9 % IJ SOLN: 3.0000 mL | Freq: Two times a day (BID) | INTRAMUSCULAR | Status: DC

## 2015-03-18 MED ORDER — ASPIRIN 81 MG PO CHEW
81.0000 mg | CHEWABLE_TABLET | ORAL | Status: AC
  Administered 2015-03-18: 81 mg via ORAL

## 2015-03-18 MED ORDER — SODIUM CHLORIDE 0.9 % IJ SOLN
INTRAMUSCULAR | Status: AC
  Filled 2015-03-18: qty 10

## 2015-03-18 MED ORDER — PHENYLEPHRINE HCL 10 MG/ML IJ SOLN
0.0000 ug/min | INTRAMUSCULAR | Status: DC
  Administered 2015-03-19: 50 ug/min via INTRAVENOUS
  Filled 2015-03-18 (×5): qty 2

## 2015-03-18 MED ORDER — SODIUM CHLORIDE 0.45 % IV SOLN
INTRAVENOUS | Status: DC | PRN
  Administered 2015-03-18: 18:00:00 via INTRAVENOUS

## 2015-03-18 MED ORDER — SODIUM CHLORIDE 0.9 % IV SOLN: 250.0000 mL | INTRAVENOUS | Status: DC | PRN

## 2015-03-18 MED ORDER — SODIUM CHLORIDE 0.9 % IV SOLN: 1.0000 mL/kg/h | INTRAVENOUS | Status: AC

## 2015-03-18 MED ORDER — CEFUROXIME SODIUM 1.5 G IJ SOLR
1.5000 g | Freq: Two times a day (BID) | INTRAMUSCULAR | Status: AC
  Administered 2015-03-18 – 2015-03-20 (×4): 1.5 g via INTRAVENOUS
  Filled 2015-03-18 (×4): qty 1.5

## 2015-03-18 MED ORDER — VERAPAMIL HCL 2.5 MG/ML IV SOLN
INTRAVENOUS | Status: AC
  Filled 2015-03-18: qty 2

## 2015-03-18 MED ORDER — HEPARIN SODIUM (PORCINE) 1000 UNIT/ML IJ SOLN
INTRAMUSCULAR | Status: DC | PRN
  Administered 2015-03-18: 25000 [IU] via INTRAVENOUS

## 2015-03-18 MED ORDER — MIDAZOLAM HCL 2 MG/2ML IJ SOLN
INTRAMUSCULAR | Status: AC
  Filled 2015-03-18: qty 2

## 2015-03-18 MED ORDER — DOCUSATE SODIUM 100 MG PO CAPS
200.0000 mg | ORAL_CAPSULE | Freq: Every day | ORAL | Status: DC
  Administered 2015-03-19 – 2015-03-23 (×4): 200 mg via ORAL
  Filled 2015-03-18 (×4): qty 2

## 2015-03-18 MED ORDER — BISACODYL 10 MG RE SUPP: 10.0000 mg | Freq: Every day | RECTAL | Status: DC

## 2015-03-18 MED ORDER — SODIUM CHLORIDE 0.9 % IV SOLN
INTRAVENOUS | Status: AC
  Administered 2015-03-18: 1.1 [IU]/h via INTRAVENOUS
  Filled 2015-03-18: qty 2.5

## 2015-03-18 MED ORDER — ROCURONIUM BROMIDE 50 MG/5ML IV SOLN
INTRAVENOUS | Status: AC
  Filled 2015-03-18: qty 1

## 2015-03-18 MED ORDER — DEXMEDETOMIDINE HCL IN NACL 400 MCG/100ML IV SOLN
0.1000 ug/kg/h | INTRAVENOUS | Status: DC
  Filled 2015-03-18: qty 100

## 2015-03-18 MED ORDER — HEPARIN (PORCINE) IN NACL 2-0.9 UNIT/ML-% IJ SOLN
INTRAMUSCULAR | Status: AC
  Filled 2015-03-18: qty 1000

## 2015-03-18 MED ORDER — PLASMA-LYTE 148 IV SOLN
INTRAVENOUS | Status: AC
  Administered 2015-03-18: 500 mL
  Filled 2015-03-18: qty 2.5

## 2015-03-18 MED ORDER — SODIUM CHLORIDE 0.9 % IJ SOLN
3.0000 mL | Freq: Two times a day (BID) | INTRAMUSCULAR | Status: DC
  Administered 2015-03-19 – 2015-03-21 (×5): 3 mL via INTRAVENOUS

## 2015-03-18 MED ORDER — SODIUM CHLORIDE 0.9 % IV SOLN
INTRAVENOUS | Status: AC
  Administered 2015-03-18: 16:00:00 via INTRAVENOUS
  Administered 2015-03-18: 69.8 mL/h via INTRAVENOUS
  Filled 2015-03-18: qty 40

## 2015-03-18 MED ORDER — PROPOFOL 10 MG/ML IV BOLUS
INTRAVENOUS | Status: DC | PRN
  Administered 2015-03-18: 30 mg via INTRAVENOUS

## 2015-03-18 MED ORDER — HYDRALAZINE HCL 20 MG/ML IJ SOLN
INTRAMUSCULAR | Status: AC
  Filled 2015-03-18: qty 1

## 2015-03-18 MED ORDER — OXYCODONE HCL 5 MG PO TABS
5.0000 mg | ORAL_TABLET | ORAL | Status: DC | PRN
  Administered 2015-03-19 – 2015-03-20 (×4): 10 mg via ORAL
  Filled 2015-03-18 (×4): qty 2

## 2015-03-18 MED ORDER — LACTATED RINGERS IV SOLN: 500.0000 mL | Freq: Once | INTRAVENOUS | Status: AC | PRN

## 2015-03-18 MED ORDER — NITROGLYCERIN 1 MG/10 ML FOR IR/CATH LAB
INTRA_ARTERIAL | Status: AC
  Filled 2015-03-18: qty 10

## 2015-03-18 MED ORDER — SODIUM CHLORIDE 0.9 % IV SOLN: INTRAVENOUS | Status: DC

## 2015-03-18 MED ORDER — VANCOMYCIN HCL 10 G IV SOLR
1250.0000 mg | INTRAVENOUS | Status: AC
  Administered 2015-03-18: 1250 mg via INTRAVENOUS
  Filled 2015-03-18 (×2): qty 1250

## 2015-03-18 MED ORDER — INSULIN REGULAR BOLUS VIA INFUSION
0.0000 [IU] | Freq: Three times a day (TID) | INTRAVENOUS | Status: DC
  Filled 2015-03-18: qty 10

## 2015-03-18 SURGICAL SUPPLY — 77 items
APL SKNCLS STERI-STRIP NONHPOA (GAUZE/BANDAGES/DRESSINGS) ×2
BAG DECANTER FOR FLEXI CONT (MISCELLANEOUS) ×3 IMPLANT
BANDAGE ELASTIC 4 VELCRO ST LF (GAUZE/BANDAGES/DRESSINGS) ×3 IMPLANT
BANDAGE ELASTIC 6 VELCRO ST LF (GAUZE/BANDAGES/DRESSINGS) ×3 IMPLANT
BENZOIN TINCTURE PRP APPL 2/3 (GAUZE/BANDAGES/DRESSINGS) ×1 IMPLANT
BLADE STERNUM SYSTEM 6 (BLADE) ×3 IMPLANT
BLADE SURG 11 STRL SS (BLADE) ×1 IMPLANT
BNDG GAUZE ELAST 4 BULKY (GAUZE/BANDAGES/DRESSINGS) ×3 IMPLANT
CANISTER SUCTION 2500CC (MISCELLANEOUS) ×3 IMPLANT
CATH CPB KIT GERHARDT (MISCELLANEOUS) ×3 IMPLANT
CATH THORACIC 28FR (CATHETERS) ×3 IMPLANT
CLIP FOGARTY SPRING 6M (CLIP) ×1 IMPLANT
CONN ST 1/4X3/8  BEN (MISCELLANEOUS) ×2
CONN ST 1/4X3/8 BEN (MISCELLANEOUS) IMPLANT
CRADLE DONUT ADULT HEAD (MISCELLANEOUS) ×3 IMPLANT
DRAIN CHANNEL 28F RND 3/8 FF (WOUND CARE) ×3 IMPLANT
DRAPE CARDIOVASCULAR INCISE (DRAPES) ×3
DRAPE SLUSH/WARMER DISC (DRAPES) ×3 IMPLANT
DRAPE SRG 135X102X78XABS (DRAPES) ×2 IMPLANT
DRSG AQUACEL AG ADV 3.5X14 (GAUZE/BANDAGES/DRESSINGS) ×3 IMPLANT
ELECT BLADE 4.0 EZ CLEAN MEGAD (MISCELLANEOUS) ×3
ELECT REM PT RETURN 9FT ADLT (ELECTROSURGICAL) ×6
ELECTRODE BLDE 4.0 EZ CLN MEGD (MISCELLANEOUS) ×2 IMPLANT
ELECTRODE REM PT RTRN 9FT ADLT (ELECTROSURGICAL) ×4 IMPLANT
GAUZE SPONGE 4X4 12PLY STRL (GAUZE/BANDAGES/DRESSINGS) ×6 IMPLANT
GLOVE BIO SURGEON STRL SZ 6 (GLOVE) ×4 IMPLANT
GLOVE BIO SURGEON STRL SZ 6.5 (GLOVE) ×11 IMPLANT
GLOVE BIOGEL PI IND STRL 6 (GLOVE) IMPLANT
GLOVE BIOGEL PI INDICATOR 6 (GLOVE) ×2
GOWN STRL REUS W/ TWL LRG LVL3 (GOWN DISPOSABLE) ×8 IMPLANT
GOWN STRL REUS W/TWL LRG LVL3 (GOWN DISPOSABLE) ×12
HEMOSTAT POWDER SURGIFOAM 1G (HEMOSTASIS) ×9 IMPLANT
HEMOSTAT SURGICEL 2X14 (HEMOSTASIS) ×3 IMPLANT
KIT BASIN OR (CUSTOM PROCEDURE TRAY) ×3 IMPLANT
KIT CATH SUCT 8FR (CATHETERS) ×3 IMPLANT
KIT ROOM TURNOVER OR (KITS) ×3 IMPLANT
KIT SUCTION CATH 14FR (SUCTIONS) ×6 IMPLANT
KIT VASOVIEW W/TROCAR VH 2000 (KITS) ×3 IMPLANT
LEAD PACING MYOCARDI (MISCELLANEOUS) ×3 IMPLANT
MARKER GRAFT CORONARY BYPASS (MISCELLANEOUS) ×9 IMPLANT
NS IRRIG 1000ML POUR BTL (IV SOLUTION) ×16 IMPLANT
PACK OPEN HEART (CUSTOM PROCEDURE TRAY) ×3 IMPLANT
PAD ARMBOARD 7.5X6 YLW CONV (MISCELLANEOUS) ×6 IMPLANT
PAD ELECT DEFIB RADIOL ZOLL (MISCELLANEOUS) ×3 IMPLANT
PENCIL BUTTON HOLSTER BLD 10FT (ELECTRODE) ×3 IMPLANT
PUNCH AORTIC ROTATE 4.5MM 8IN (MISCELLANEOUS) ×1 IMPLANT
SET CARDIOPLEGIA MPS 5001102 (MISCELLANEOUS) ×1 IMPLANT
SPONGE GAUZE 4X4 12PLY STER LF (GAUZE/BANDAGES/DRESSINGS) ×2 IMPLANT
SPONGE LAP 18X18 X RAY DECT (DISPOSABLE) ×4 IMPLANT
STRIP CLOSURE SKIN 1/2X4 (GAUZE/BANDAGES/DRESSINGS) ×1 IMPLANT
SUT BONE WAX W31G (SUTURE) ×3 IMPLANT
SUT MNCRL AB 4-0 PS2 18 (SUTURE) ×1 IMPLANT
SUT PROLENE 3 0 SH1 36 (SUTURE) ×3 IMPLANT
SUT PROLENE 4 0 TF (SUTURE) ×6 IMPLANT
SUT PROLENE 5 0 C 1 36 (SUTURE) ×1 IMPLANT
SUT PROLENE 6 0 C 1 24 (SUTURE) ×2 IMPLANT
SUT PROLENE 6 0 C 1 30 (SUTURE) ×2 IMPLANT
SUT PROLENE 6 0 CC (SUTURE) ×6 IMPLANT
SUT PROLENE 7 0 BV 1 (SUTURE) ×1 IMPLANT
SUT PROLENE 7 0 BV1 MDA (SUTURE) ×4 IMPLANT
SUT PROLENE 7.0 RB 3 (SUTURE) ×2 IMPLANT
SUT PROLENE 8 0 BV175 6 (SUTURE) ×3 IMPLANT
SUT STEEL 6MS V (SUTURE) ×3 IMPLANT
SUT STEEL SZ 6 DBL 3X14 BALL (SUTURE) ×3 IMPLANT
SUT VIC AB 1 CTX 18 (SUTURE) ×6 IMPLANT
SUT VIC AB 2-0 CT1 27 (SUTURE) ×3
SUT VIC AB 2-0 CT1 TAPERPNT 27 (SUTURE) IMPLANT
SUTURE E-PAK OPEN HEART (SUTURE) ×3 IMPLANT
SYSTEM SAHARA CHEST DRAIN ATS (WOUND CARE) ×3 IMPLANT
TAPE CLOTH SURG 4X10 WHT LF (GAUZE/BANDAGES/DRESSINGS) ×1 IMPLANT
TAPE PAPER 2X10 WHT MICROPORE (GAUZE/BANDAGES/DRESSINGS) ×1 IMPLANT
TOWEL OR 17X24 6PK STRL BLUE (TOWEL DISPOSABLE) ×6 IMPLANT
TOWEL OR 17X26 10 PK STRL BLUE (TOWEL DISPOSABLE) ×6 IMPLANT
TRAY FOLEY IC TEMP SENS 16FR (CATHETERS) ×3 IMPLANT
TUBING INSUFFLATION (TUBING) ×3 IMPLANT
UNDERPAD 30X30 INCONTINENT (UNDERPADS AND DIAPERS) ×3 IMPLANT
WATER STERILE IRR 1000ML POUR (IV SOLUTION) ×6 IMPLANT

## 2015-03-18 NOTE — Progress Notes (Signed)
Patient ID: Bradley Terrell, male   DOB: 11-29-1941, 74 y.o.   MRN: [id number]   SICU Evening Rounds:   Hemodynamically stable  CI = 3.5  On vent. Not awake yet  Urine output good  CT output low  CBC    Component Value Date/Time   WBC 8.4 03/18/2015 1720   RBC 3.27* 03/18/2015 1720   HGB 10.2* 03/18/2015 1727   HCT 30.0* 03/18/2015 1727   PLT 102* 03/18/2015 1720   MCV 91.1 03/18/2015 1720   MCH 32.4 03/18/2015 1720   MCHC 35.6 03/18/2015 1720   RDW 12.7 03/18/2015 1720   LYMPHSABS 1.8 03/16/2015 1203   MONOABS 0.8 03/16/2015 1203   EOSABS 0.1 03/16/2015 1203   BASOSABS 0.0 03/16/2015 1203     BMET    Component Value Date/Time   NA 140 03/18/2015 1727   K 3.1* 03/18/2015 1727   CL 95* 03/18/2015 1440   CO2 29 03/16/2015 1203   GLUCOSE 102* 03/18/2015 1727   BUN 11 03/18/2015 1440   CREATININE 0.90 03/18/2015 1440   CREATININE 1.11 04/15/2014 0855   CALCIUM 9.7 03/16/2015 1203     A/P:  Stable postop course. Continue current plans

## 2015-03-18 NOTE — H&P (View-Only) (Signed)
Patient ID: Bradley Terrell, male   DOB: [birth year], 74 y.o.   MRN: [id number] Patient ID: Bradley Terrell, male   DOB: Jan 16, 1941, 74 y.o.   MRN: [id number]    [place], [place], [place]  [address] Phone: [phone] Fax:  [phone]  Date:  03/03/2015   ID:  Bradley Terrell, DOB [birth year], MRN [id number]  PCP:  Vikki Ports, MD      History of Present Illness: Bradley Terrell is a 74 y.o. male  who had CAD. HE had a 2 vessel intervention in [place] in 1991. He has done well.  He had been not walking the treadmill.  . Worked 4 days a week, but has cut back. He walks a lot at work. He does yard work. No sx with that.  He walks outside more frequwently and has noted that when he walks up hill, he has some SHOB and right shoulder pain.  He has had SHOB during intercourse.  Somewhat atypical compared to prior angina. CAD/ASCVD:  Denies : Dizziness.  Leg edema.  Nitroglycerin.  Orthopnea.  Palpitations.  Paroxysmal nocturnal dyspnea.  Syncope.  Sx at the time of his CAD was a tightness of the chest. It felt like a bear hug.     SHOB is occuring anytime with activity, 5 days/ week.  He does not have NTG.  He has not used it in the past.      Wt Readings from Last 3 Encounters:  03/03/15 165 lb (74.844 kg)  11/18/14 168 lb (76.204 kg)  04/22/14 166 lb (75.297 kg)     Past Medical History  Diagnosis Date  . CAD (coronary artery disease) 1991    Dr. Irish Lack.  atherectomy from RCA and LAD '91  . HTN (hypertension)   . Renal cyst     left  . Gilbert syndrome     elevated indirect bilirubin  . Wheezing     with beta blockers (per Dr. Don Broach notes)  . Inguinal hernia     R>L, easily reducible  . Wheezing     WITH B-BLOCKERS    Current Outpatient Prescriptions  Medication Sig Dispense Refill  . atorvastatin (LIPITOR) 80 MG tablet Take 1 tablet (80 mg total) by mouth daily. 90 tablet 3  . Biotin 5000 MCG CAPS Take 1 capsule by mouth daily.    . Calcium  Carbonate-Vit D-Min (CALCIUM 600+D PLUS MINERALS) 600-400 MG-UNIT TABS Take 1 tablet by mouth daily.      . cholecalciferol (VITAMIN D) 1000 UNITS tablet Take 1,000 Units by mouth daily.    . fish oil-omega-3 fatty acids 1000 MG capsule Take 1 g by mouth daily.      . Glucosamine 500 MG CAPS Take 1 capsule by mouth daily.     Marland Kitchen lisinopril-hydrochlorothiazide (PRINZIDE,ZESTORETIC) 20-12.5 MG per tablet Take 2 tablets by mouth  daily 180 tablet 0  . Multiple Vitamins-Minerals (CENTRUM SILVER PO) Take 1 tablet by mouth daily.      . NON FORMULARY raisen soaked in gin    . Probiotic Product (PROBIOTIC DAILY PO) Take by mouth.    . saw palmetto 160 MG capsule Take 160 mg by mouth daily.     Marland Kitchen ULORIC 40 MG tablet Take 1 tablet by mouth  daily 90 tablet 0  . vitamin E 100 UNIT capsule Take 100 Units by mouth daily.    . Zinc Sulfate (ZINC 15 PO) Take 1 tablet by mouth daily.       No  current facility-administered medications for this visit.    Allergies:   No Known Allergies  Social History:  The patient  reports that he has never smoked. He has never used smokeless tobacco. He reports that he drinks alcohol. He reports that he does not use illicit drugs.   Family History:  The patient's family history includes Cancer in his brother, mother, other, and sister; Cancer (age of onset: 60) in his son; Cancer (age of onset: 36) in his brother; Colon cancer in his brother and son; Heart disease in his father; Heart disease (age of onset: 59) in his brother; Hypertension in his father and sister; Parkinson's disease in his sister. There is no history of Diabetes.   ROS:  Please see the history of present illness.  No nausea, vomiting.  No fevers, chills.  No focal weakness.  No dysuria.    All other systems reviewed and negative.   PHYSICAL EXAM: VS:  BP 138/80 mmHg  Pulse 76  Ht 5\' 11"  (1.803 m)  Wt 165 lb (74.844 kg)  BMI 23.02 kg/m2  SpO2 98% Well nourished, well developed, in no acute  distress HEENT: normal Neck: no JVD, no carotid bruits Cardiac:  normal S1, S2; RRR;  Lungs:  clear to auscultation bilaterally, no wheezing, rhonchi or rales Abd: soft, nontender, no hepatomegaly Ext: no edema, 2+ right radial pulse Skin: warm and dry Neuro:   no focal abnormalities noted Psych: normal affect  EKG:  NSR, LAD     ASSESSMENT AND PLAN:  Coronary atherosclerosis of native coronary artery  Continue Aspirin Tablet, 81 MG, 1 tab, Orally, qd       Notes: SHOB nad shoulder pain concerning for angina. S/p [place] in 1991. He had not been taking aspirin regularly last year, but is now better about this. SHOB intermittentl, but routinely with exercise.  Negative nuclear stress test in 2013.  Discussed cath versus repeat stress test. He would prefer to have a repeat stress test. I did explain that if his stress test is abnormal, he will need a cath. If his symptoms persist, he will likely need a cath as well.  Give Rx for SL NTG.    2. Essential hypertension, benign  Continue Lisinopril-Hydrochlorothiazide Tablet, 20-12.5 MG, 2 tablets, Orally, Once a day Notes: COntrolled. Checking BP at home. He has had consistently elevated readings. The lowest are in the 120s but he has many readings over 631 systolic. Given the concern for angina, will start carvedilol 3.125 mg by mouth twice a day.  This can be uptitrated.    3. Hyperlipidemia  Continue Fish Oil Capsule, 1000 mg, 1 tab, Orally, bid Stopped Vytorin Tablet, 10-80 MG, 1 tablet, Orally, Once a day  Due to cost, $5/day Notes:  Started atorvastatin 80 mg daily. LDL 62 in 12/15.  TOlerating this well.         Signed, Mina Marble, MD, [facility] 03/03/2015 9:11 AM

## 2015-03-18 NOTE — Anesthesia Procedure Notes (Addendum)
Anesthesia Procedure Note PA catheter:  Routine monitors. Timeout, sterile prep, drape, FBP R neck. 1% Lido local, finder and trocar RIJ 1st pass with US guidance.  Cordis placed over J wire. PA catheter in easily.  Sterile dressing applied.  Patient tolerated well, VSS.  Jenita Seashore, MD 10:42-10:50 Procedure Name: Intubation Date/Time: 03/18/2015 11:15 AM Performed by: Susa Loffler Pre-anesthesia Checklist: Patient identified, Timeout performed, Emergency Drugs available, Suction available and Patient being monitored Patient Re-evaluated:Patient Re-evaluated prior to inductionOxygen Delivery Method: Circle system utilized Preoxygenation: Pre-oxygenation with 100% oxygen Intubation Type: IV induction Ventilation: Mask ventilation without difficulty Laryngoscope Size: Mac and 3 Grade View: Grade I Tube type: Oral Tube size: 8.0 mm Number of attempts: 1 Airway Equipment and Method: Stylet Placement Confirmation: ETT inserted through vocal cords under direct vision,  positive ETCO2 and breath sounds checked- equal and bilateral Secured at: 22 cm Tube secured with: Tape Dental Injury: Teeth and Oropharynx as per pre-operative assessment

## 2015-03-18 NOTE — CV Procedure (Signed)
       PROCEDURE:  Left heart catheterization with selective coronary angiography, left ventriculogram.  INDICATIONS:  Abnormal stress test  The risks, benefits, and details of the procedure were explained to the patient.  The patient verbalized understanding and wanted to proceed.  Informed written consent was obtained.  PROCEDURE TECHNIQUE:  After Xylocaine anesthesia a 52F slender sheath was placed in the right radial artery with a single anterior needle wall stick.   IV Heparin was given.  Right coronary angiography was done using a Judkins R4 guide catheter.  Left coronary angiography was done using a Judkins L3.5 guide catheter.  Left ventriculography was done using a pigtail catheter.  A TR band was used for hemostasis.   CONTRAST:  Total of 70 cc.  COMPLICATIONS:  None.    HEMODYNAMICS:  Aortic pressure was 125/60; LV pressure was 118/4; LVEDP 18.  There was no gradient between the left ventricle and aorta.    ANGIOGRAPHIC DATA:   The left main coronary artery is a large vessel with a 95% distal stenosis. The vessel is heavily calcified.  The left anterior descending artery is there is severe ostial disease of the LAD from the plaque which extends from the left main. There is mild to moderate diffuse calcific plaque in the proximal LAD. The mid to distal LAD has only mild disease. The first diagonal is large and appears patent.  The left circumflex artery is a large vessel. There is an ostial stenosis which is severe from the left main plaque extending into the circumflex. There is a small ramus which also has a severe ostial stenosis which is patent. The first significant OM is large and widely patent. There are left to right collaterals from the circumflex which feed into the distal RCA system.  The right coronary artery is a medium-sized vessel. There is a severely calcified subtotal occlusion proximally, 99%. The posterior descending artery bifurcates from the mid RCA. This vessel  appears patent. The posterior lateral artery is patent as well. There are left to right collaterals feeding the posterior lateral territory.Marland Kitchen  LEFT VENTRICULOGRAM:  Left ventricular angiogram was done in the 30 RAO projection and revealed normal left ventricular wall motion and systolic function with an estimated ejection fraction of 60%.  LVEDP was 18 mmHg.  IMPRESSIONS:  1. Severe three-vessel coronary artery disease as noted above. 2. Normal left ventricular systolic function.  LVEDP 18 mmHg.  Ejection fraction 60%.  RECOMMENDATION:  The patient will be referred for cardiac surgery consultation. We'll keep him nothing by mouth in the event that the surgery can be done later today.

## 2015-03-18 NOTE — Consult Note (Signed)
[place] [address]       Bradley Terrell,Bradley Terrell [address]             [phone]        [place] [facility] Medical Record #[id number] Date of Birth: 1941/10/24  Referring: Dr. Irish Lack Primary Care: Vikki Ports, MD  Chief Complaint:   Shortness of breath with exertion and right forearm pain.    History of Present Illness:     This is a 74 year old Caucasian male with a history of CAD (s/p atherectomy in 1991), hypertension and hyperlipidemia who presented to [facility] for an elective cardiac catheterization by Dr. Irish Lack. According to the patient, he has had episodes of shortness of breath with exertion and right forearm pain. He denies chest pain, nausea, diaphoresis, or syncope.   Elective cath done today for intermediate risk exercise test.  Current Activity/ Functional Status: Patient is independent with mobility/ambulation, transfers, ADL's, IADL's.   Zubrod Score: At the time of surgery this patient's most appropriate activity status/level should be described as: []     0    Normal activity, no symptoms [x]     1    Restricted in physical strenuous activity but ambulatory, able to do out light work []     2    Ambulatory and capable of self care, unable to do work activities, up and about                 more than 50%  Of the time                            []     3    Only limited self care, in bed greater than 50% of waking hours []     4    Completely disabled, no self care, confined to bed or chair []     5    Moribund  Past Medical History  Diagnosis Date  . CAD (coronary artery disease) 1991    Dr. Irish Lack.  atherectomy from RCA and LAD '91  . HTN (hypertension)   . Renal cyst     left  . Gilbert syndrome     elevated indirect bilirubin  . Wheezing     with beta blockers (per Dr. Don Broach notes)  . Inguinal hernia     R>L, easily reducible  . Wheezing     WITH B-BLOCKERS    Past Surgical History  Procedure Laterality Date  .  Tonsillectomy  age 58  . Cardiac atherectomy  '91    to LAD and RCA  . Prk  '96    (refractive eye surgery)  . Colonoscopy  03/2009, 05/2014    History  Smoking status  . Never Smoker   Smokeless tobacco  . Never Used    History  Alcohol Use  .     Comment: 1 1/2 oz bourbon and ginger daily, wine with dinner, stinger in the evening.     Social History  . Marital Status: Married    Spouse Name: N/A  . Number of Children: 5  . Years of Education: N/A   Occupational History  . manager of self storage center, semi retired     Social History Narrative   Married, 1 dog, 1 cats.  Son in [place] (has terminal colon cancer), 3 in [place]. 7 grandchildren, 1 great-grandchild   Lost job 07/2014.   Allergies: No Known Allergies  Current Facility-Administered Medications  Medication Dose Route Frequency Provider Last Rate Last Dose  . 0.9 %  sodium chloride infusion  1 mL/kg/hr Intravenous Continuous Jettie Booze, MD 73.9 mL/hr at 03/18/15 1005 1 mL/kg/hr at 03/18/15 1005  . aspirin 81 MG chewable tablet             Prescriptions prior to admission  Medication Sig Dispense Refill Last Dose  . atorvastatin (LIPITOR) 80 MG tablet Take 1 tablet (80 mg total) by mouth daily. 90 tablet 3 03/18/2015 at Unknown time  . Biotin 5000 MCG CAPS Take 1 capsule by mouth daily.   03/18/2015 at Unknown time  . Calcium Carbonate-Vit D-Min (CALCIUM 600+D PLUS MINERALS) 600-400 MG-UNIT TABS Take 1 tablet by mouth daily.     03/17/2015 at Unknown time  . carvedilol (COREG) 3.125 MG tablet Take 1 tablet (3.125 mg total) by mouth 2 (two) times daily with a meal. 60 tablet 3 03/18/2015 at 0615  . cholecalciferol (VITAMIN D) 1000 UNITS tablet Take 1,000 Units by mouth daily.   03/17/2015 at Unknown time  . fish oil-omega-3 fatty acids 1000 MG capsule Take 1 g by mouth daily.     03/18/2015 at Unknown time  . Glucosamine 500 MG CAPS Take 1 capsule by mouth daily.    03/17/2015 at Unknown time  .  lisinopril-hydrochlorothiazide (PRINZIDE,ZESTORETIC) 20-12.5 MG per tablet Take 2 tablets by mouth  daily 180 tablet 0 03/18/2015 at Unknown time  . lisinopril-hydrochlorothiazide (PRINZIDE,ZESTORETIC) 20-12.5 MG per tablet Take 2 tablets by mouth  daily 180 tablet 0 03/18/2015 at Unknown time  . Multiple Vitamins-Minerals (CENTRUM SILVER PO) Take 1 tablet by mouth daily.     03/17/2015 at Unknown time  . nitroGLYCERIN (NITROSTAT) 0.4 MG SL tablet Place 1 tablet (0.4 mg total) under the tongue every 5 (five) minutes as needed for chest pain. 25 tablet 3 never  . Probiotic Product (PROBIOTIC DAILY PO) Take 1 tablet by mouth daily.    03/17/2015 at Unknown time  . saw palmetto 160 MG capsule Take 160 mg by mouth daily.    03/17/2015 at Unknown time  . ULORIC 40 MG tablet Take 1 tablet by mouth  daily 90 tablet 0 03/18/2015 at Unknown time  . vitamin E 100 UNIT capsule Take 100 Units by mouth daily.   03/17/2015 at Unknown time  . Zinc Sulfate (ZINC 15 PO) Take 1 tablet by mouth daily.     03/17/2015 at Unknown time  . NON FORMULARY raisen soaked in gin   More than a month at Unknown time    Family History  Problem Relation Age of Onset  . Cancer Mother     Breast;deceased at age 38  . Hypertension Father   . Heart disease Father. He died of an MI at age 22   . Cancer Sister     pancreatic cancer  . Heart disease Brother 32    CABG  . Cancer Brother     colon  . Colon cancer Brother     late 69's  . Cancer Son 96    colon cancer, recurred at 69 (metastatic)  . Colon cancer Son   . Diabetes Neg Hx   . Cancer Brother 4    lung cancer, smoker  . Parkinson's disease Sister   . Hypertension Sister   . Cancer Other     pancreatic   Review of Systems:     Cardiac Review of Systems: Y or N  Chest Pain [ N   ]  Resting SOB [  N ] Exertional SOB  [ Y ]  Orthopnea [ N ]   Pedal Edema [ N  ]    Palpitations [ N ] Syncope  Aqua.Slicker  ]   Presyncope [ N  ]  General Review of Systems: [Y] = yes [ N  ]=no Constitional:  anorexia [ N ]; nausea [ N ]; night sweats Aqua.Slicker  ]; fever Aqua.Slicker  ]; or chills Aqua.Slicker  ]                         Eye : blurred vision Aqua.Slicker  ] Resp: cough [ N ];  wheezing[ N ] GI: vomiting[ N ];  dysphagia[ N ] GU: hematuria[ N ]             Skin: rash, swelling[N  ]  Heme/Lymph: bleedingN[  ];  anemia[N ];  Neuro: Sharlene.Ates  ]  Endocrine: diabetes[N  ];  thyroid dysfunction[ N ];    Physical Exam: BP 144/85 mmHg  Pulse 95  Temp(Src) 97.8 F (36.6 C) (Oral)  Resp 18  Ht 6' (1.829 m)  Wt 163 lb (73.936 kg)  BMI 22.10 kg/m2  SpO2 95%   General appearance: alert, cooperative and no distress Head: Normocephalic, without obvious abnormality, atraumatic Neck: no adenopathy, no carotid bruit, no JVD and supple, symmetrical, trachea midline Resp: clear to auscultation bilaterally Cardio: regular rate and rhythm, S1, S2 normal, no murmur, click, rub or gallop GI: soft, non-tender; bowel sounds normal; no masses,  no organomegaly Extremities: No cyanosis, clubbing, or edema. Palpable DP bilaterally Neurologic: Grossly normal      Recent Radiology Findings:  STAT portable ordered     Recent Lab Findings: Lab Results  Component Value Date   WBC 5.8 03/16/2015   HGB 14.9 03/16/2015   HCT 43.1 03/16/2015   PLT 181.0 03/16/2015   GLUCOSE 92 03/16/2015   CHOL 145 11/11/2014   TRIG 104 11/11/2014   HDL 62 11/11/2014   LDLCALC 62 11/11/2014   ALT 21 11/11/2014   AST 23 11/11/2014   NA 138 03/16/2015   K 3.6 03/16/2015   CL 101 03/16/2015   CREATININE 1.15 03/16/2015   BUN 15 03/16/2015   CO2 29 03/16/2015   TSH 1.913 04/15/2014   INR 1.0 03/16/2015     Assessment / Plan:      1. Coronary artery disease-95% left main included and 95% proximal rca . Plan  emergent CABG asap 2. History of hypertension 3. Hyperlipidemia    The goals risks and alternatives of the planned surgical procedure  Emergency CABG have been discussed with the patient in detail. The risks  of the procedure including death, infection, stroke, myocardial infarction, bleeding, blood transfusion have all been discussed specifically.  I have quoted Bradley Terrell a 5 % of perioperative mortality and a complication rate as high as 25%. The patient's questions have been answered.Bradley Terrell is willing  to proceed with the planned procedure.   Grace Isaac MD      [place] [place] [address] Office [phone]   Beeper [phone]  03/18/2015 11:00 AM

## 2015-03-18 NOTE — Anesthesia Postprocedure Evaluation (Signed)
  Anesthesia Post-op Note  Patient: Bradley Terrell  Procedure(s) Performed: Procedure(s): CORONARY ARTERY BYPASS GRAFTING (CABG) x  four, LIMA to LAD, SVG-Ramus-dCx, SVG - dRCA using left internal mammary artery and right leg thigh & calf greater saphenous vein harvested endoscopically (N/A) INTRAOPERATIVE TRANSESOPHAGEAL ECHOCARDIOGRAM (N/A) ENDOSCPOIC HARVEST OF RIGHT GREATER SAPHENOUS VEIN (Right)  Patient Location: ICU  Anesthesia Type:General  Level of Consciousness: Patient remains intubated per anesthesia plan  Airway and Oxygen Therapy: Patient remains intubated per anesthesia plan  Post-op Pain: none  Post-op Assessment: Post-op Vital signs reviewed  Post-op Vital Signs: Reviewed  Last Vitals:  Filed Vitals:   03/18/15 1745  BP:   Pulse: 80  Temp: 35 C  Resp: 12    Complications: No apparent anesthesia complications

## 2015-03-18 NOTE — OR Nursing (Signed)
16:40 - 2nd call to SICU charge nurse

## 2015-03-18 NOTE — Interval H&P Note (Signed)
Cath Lab Visit (complete for each Cath Lab visit)  Clinical Evaluation Leading to the Procedure:   ACS: No.  Non-ACS:    Anginal Classification: CCS III  Anti-ischemic medical therapy: Maximal Therapy (2 or more classes of medications)  Non-Invasive Test Results: Intermediate-risk stress test findings: cardiac mortality 1-3%/year  Prior CABG: No previous CABG  Ischemic Symptoms? CCS III (Marked limitation of ordinary activity) Anti-ischemic Medical Therapy? Maximal Medical Therapy (2 or more classes of medications) Non-invasive Test Results? Intermediate-risk stress test findings: cardiac mortality 1-3%/year Prior CABG? No Previous CABG   Patient Information:   1-2V CAD, no prox LAD  A (8)  Indication: 17; Score: 8   Patient Information:   CTO of 1 vessel, no other CAD  A (7)  Indication: 27; Score: 7   Patient Information:   1V CAD with prox LAD  A (9)  Indication: 33; Score: 9   Patient Information:   2V-CAD with prox LAD  A (9)  Indication: 39; Score: 9   Patient Information:   3V-CAD without LMCA  A (9)  Indication: 45; Score: 9   Patient Information:   3V-CAD without LMCA With Abnormal LV systolic function  A (9)  Indication: 48; Score: 9   Patient Information:   LMCA-CAD  A (9)  Indication: 49; Score: 9   Patient Information:   2V-CAD with prox LAD PCI  A (7)  Indication: 62; Score: 7   Patient Information:   2V-CAD with prox LAD CABG  A (8)  Indication: 62; Score: 8   Patient Information:   3V-CAD without LMCA With Low CAD burden(i.e., 3 focal stenoses, low SYNTAX score) PCI  A (7)  Indication: 63; Score: 7   Patient Information:   3V-CAD without LMCA With Low CAD burden(i.e., 3 focal stenoses, low SYNTAX score) CABG  A (9)  Indication: 63; Score: 9   Patient Information:   3V-CAD without LMCA E06c - Intermediate-high CAD burden (i.e., multiple diffuse lesions, presence of CTO, or high SYNTAX  score) PCI  U (4)  Indication: 64; Score: 4   Patient Information:   3V-CAD without LMCA E06c - Intermediate-high CAD burden (i.e., multiple diffuse lesions, presence of CTO, or high SYNTAX score) CABG  A (9)  Indication: 64; Score: 9   Patient Information:   LMCA-CAD With Isolated LMCA stenosis  PCI  U (6)  Indication: 65; Score: 6   Patient Information:   LMCA-CAD With Isolated LMCA stenosis  CABG  A (9)  Indication: 65; Score: 9   Patient Information:   LMCA-CAD Additional CAD, low CAD burden (i.e., 1- to 2-vessel additional involvement, low SYNTAX score) PCI  U (5)  Indication: 66; Score: 5   Patient Information:   LMCA-CAD Additional CAD, low CAD burden (i.e., 1- to 2-vessel additional involvement, low SYNTAX score) CABG  A (9)  Indication: 66; Score: 9   Patient Information:   LMCA-CAD Additional CAD, intermediate-high CAD burden (i.e., 3-vessel involvement, presence of CTO, or high SYNTAX score) PCI  I (3)  Indication: 67; Score: 3   Patient Information:   LMCA-CAD Additional CAD, intermediate-high CAD burden (i.e., 3-vessel involvement, presence of CTO, or high SYNTAX score) CABG  A (9)  Indication: 67; Score: 9     History and Physical Interval Note:  03/18/2015 9:14 AM  Bradley Terrell  has presented today for surgery, with the diagnosis of abnormal stress test  The various methods of treatment have been discussed with the patient and family. After consideration of  risks, benefits and other options for treatment, the patient has consented to  Procedure(s): LEFT HEART CATHETERIZATION WITH CORONARY ANGIOGRAM (N/A) as a surgical intervention .  The patient's history has been reviewed, patient examined, no change in status, stable for surgery.  I have reviewed the patient's chart and labs.  Questions were answered to the patient's satisfaction.     VARANASI,JAYADEEP S.

## 2015-03-18 NOTE — Progress Notes (Signed)
Dr. Irish Lack talking w/patient. Wife in to see.

## 2015-03-18 NOTE — Progress Notes (Signed)
Wife in room. PA and anesthesia here. Aware that rt radial band on, assessment of rt wrist and location of TR syringe. Transferred to PACU, wife accompanied. No chest pain nor discomfort.

## 2015-03-18 NOTE — Procedures (Signed)
Extubation Procedure Note  Patient Details:   Name: Bradley Terrell DOB: 13-Mar-1941 MRN: [id number]   Airway Documentation:  Airway 8 mm (Active)  Secured at (cm) 24 cm 03/18/2015  7:25 PM  Measured From Lips 03/18/2015  7:25 PM  Secured Location Right 03/18/2015  7:25 PM  Secured By Rana Snare Tape 03/18/2015  7:25 PM    Evaluation  O2 sats: stable throughout Complications: No apparent complications Patient did tolerate procedure well. Bilateral Breath Sounds: Clear, Diminished   Patient extubated to 4 lpm [place] at 23:40.  Good efforts on mechanics, NIF -28  VC 1.8L.  Cuff leak noted. Patient able to vocalize post extubation.   Catha Brow 03/18/2015, 11:50 PM

## 2015-03-18 NOTE — Progress Notes (Signed)
  Echocardiogram Echocardiogram Transesophageal has been performed.  Cohl Behrens FRANCES 03/18/2015, 11:50 AM

## 2015-03-18 NOTE — Addendum Note (Signed)
Addendum  created 03/18/15 1805 by Julian Reil, CRNA   Modules edited: Anesthesia Events, Narrator   Narrator:  Narrator: Event Log Edited

## 2015-03-18 NOTE — Brief Op Note (Addendum)
      [address]       [place] [address]             [phone]      03/18/2015  6:07 PM  PATIENT:  Bradley Terrell  74 y.o. male  PRE-OPERATIVE DIAGNOSIS: Left main CAD  POST-OPERATIVE DIAGNOSIS:  Left main CAD  PROCEDURE:   CORONARY ARTERY BYPASS GRAFTING x 4 (LIMA-LAD, SVG-Ramus- dCx, SVG-dRCA) ENDOSCOPIC HARVEST OF GREATER SAPHENOUS VEIN, RIGHT LEG  SURGEON:  Surgeon(s): Grace Isaac, MD  ASSISTANT: Suzzanne Cloud, PA-C  ANESTHESIA:   general  PATIENT CONDITION:  ICU - intubated and hemodynamically stable.  PRE-OPERATIVE WEIGHT: 74 kg

## 2015-03-18 NOTE — Transfer of Care (Signed)
Immediate Anesthesia Transfer of Care Note  Patient: Bradley Terrell  Procedure(s) Performed: Procedure(s): CORONARY ARTERY BYPASS GRAFTING (CABG) x  four, LIMA to LAD, SVG-Ramus-dCx, SVG - dRCA using left internal mammary artery and right leg thigh & calf greater saphenous vein harvested endoscopically (N/A) INTRAOPERATIVE TRANSESOPHAGEAL ECHOCARDIOGRAM (N/A) ENDOSCPOIC HARVEST OF RIGHT GREATER SAPHENOUS VEIN (Right)  Patient Location: SICU  Anesthesia Type:General  Level of Consciousness: sedated, unresponsive and Patient remains intubated per anesthesia plan  Airway & Oxygen Therapy: Patient remains intubated per anesthesia plan and Patient placed on Ventilator (see vital sign flow sheet for setting)  Post-op Assessment: Report given to RN and Post -op Vital signs reviewed and stable  Post vital signs: Reviewed and stable  Last Vitals:  Filed Vitals:   03/18/15 1015  BP: 144/85  Pulse: 95  Temp:   Resp: 18    Complications: No apparent anesthesia complications

## 2015-03-18 NOTE — Anesthesia Preprocedure Evaluation (Addendum)
Anesthesia Evaluation  Patient identified by MRN, date of birth, ID band Patient awake    Reviewed: Allergy & Precautions, NPO status , Patient's Chart, lab work & pertinent test resultsPreop documentation limited or incomplete due to emergent nature of procedure.  History of Anesthesia Complications Negative for: history of anesthetic complications  Airway Mallampati: I  TM Distance: >3 FB Neck ROM: Full    Dental  (+) Teeth Intact, Dental Advisory Given   Pulmonary shortness of breath and with exertion,  breath sounds clear to auscultation        Cardiovascular hypertension, Pt. on medications - angina+ CAD (95% L main disease) Rhythm:Regular Rate:Normal  Normal LVF at cath today   Neuro/Psych negative neurological ROS     GI/Hepatic negative GI ROS, Neg liver ROS, Gilbert: elevated bili   Endo/Other  negative endocrine ROS  Renal/GU negative Renal ROS     Musculoskeletal   Abdominal   Peds  Hematology negative hematology ROS (+)   Anesthesia Other Findings   Reproductive/Obstetrics                           Anesthesia Physical Anesthesia Plan  ASA: III and emergent  Anesthesia Plan: General   Post-op Pain Management:    Induction: Intravenous  Airway Management Planned: Oral ETT  Additional Equipment: Arterial line, PA Cath, TEE and Ultrasound Guidance Line Placement  Intra-op Plan:   Post-operative Plan: Post-operative intubation/ventilation  Informed Consent: I have reviewed the patients History and Physical, chart, labs and discussed the procedure including the risks, benefits and alternatives for the proposed anesthesia with the patient or authorized representative who has indicated his/her understanding and acceptance.   Dental advisory given  Plan Discussed with: Surgeon and CRNA  Anesthesia Plan Comments: (Plan routine monitors, A line, PA cath, GETA with TEE and  post op ventilation)        Anesthesia Quick Evaluation

## 2015-03-18 NOTE — OR Nursing (Signed)
15:55 - 1st call to SICU charge nurse

## 2015-03-19 ENCOUNTER — Inpatient Hospital Stay (HOSPITAL_COMMUNITY): Payer: Medicare Other

## 2015-03-19 LAB — GLUCOSE, CAPILLARY
GLUCOSE-CAPILLARY: 101 mg/dL — AB (ref 70–99)
GLUCOSE-CAPILLARY: 109 mg/dL — AB (ref 70–99)
GLUCOSE-CAPILLARY: 114 mg/dL — AB (ref 70–99)
GLUCOSE-CAPILLARY: 120 mg/dL — AB (ref 70–99)
GLUCOSE-CAPILLARY: 127 mg/dL — AB (ref 70–99)
GLUCOSE-CAPILLARY: 128 mg/dL — AB (ref 70–99)
GLUCOSE-CAPILLARY: 132 mg/dL — AB (ref 70–99)
GLUCOSE-CAPILLARY: 144 mg/dL — AB (ref 70–99)
Glucose-Capillary: 113 mg/dL — ABNORMAL HIGH (ref 70–99)
Glucose-Capillary: 113 mg/dL — ABNORMAL HIGH (ref 70–99)
Glucose-Capillary: 119 mg/dL — ABNORMAL HIGH (ref 70–99)
Glucose-Capillary: 120 mg/dL — ABNORMAL HIGH (ref 70–99)
Glucose-Capillary: 120 mg/dL — ABNORMAL HIGH (ref 70–99)
Glucose-Capillary: 114 mg/dL — ABNORMAL HIGH (ref 70–99)
Glucose-Capillary: 136 mg/dL — ABNORMAL HIGH (ref 70–99)
Glucose-Capillary: 149 mg/dL — ABNORMAL HIGH (ref 70–99)
Glucose-Capillary: 113 mg/dL — ABNORMAL HIGH (ref 70–99)
Glucose-Capillary: 114 mg/dL — ABNORMAL HIGH (ref 70–99)
Glucose-Capillary: 120 mg/dL — ABNORMAL HIGH (ref 70–99)
Glucose-Capillary: 122 mg/dL — ABNORMAL HIGH (ref 70–99)
Glucose-Capillary: 123 mg/dL — ABNORMAL HIGH (ref 70–99)
Glucose-Capillary: 137 mg/dL — ABNORMAL HIGH (ref 70–99)
Glucose-Capillary: 160 mg/dL — ABNORMAL HIGH (ref 70–99)

## 2015-03-19 LAB — CBC
HCT: 28.5 % — ABNORMAL LOW (ref 39.0–52.0)
HEMATOCRIT: 27.2 % — AB (ref 39.0–52.0)
HEMOGLOBIN: 10 g/dL — AB (ref 13.0–17.0)
Hemoglobin: 9.6 g/dL — ABNORMAL LOW (ref 13.0–17.0)
MCH: 32.7 pg (ref 26.0–34.0)
MCH: 32.8 pg (ref 26.0–34.0)
MCHC: 35.1 g/dL (ref 30.0–36.0)
MCHC: 35.3 g/dL (ref 30.0–36.0)
MCV: 93.1 fL (ref 78.0–100.0)
MCV: 92.8 fL (ref 78.0–100.0)
PLATELETS: 122 10*3/uL — AB (ref 150–400)
Platelets: 141 10*3/uL — ABNORMAL LOW (ref 150–400)
RBC: 3.06 MIL/uL — ABNORMAL LOW (ref 4.22–5.81)
RBC: 2.93 MIL/uL — ABNORMAL LOW (ref 4.22–5.81)
RDW: 13.2 % (ref 11.5–15.5)
RDW: 13.4 % (ref 11.5–15.5)
WBC: 9.4 10*3/uL (ref 4.0–10.5)
WBC: 13.5 10*3/uL — AB (ref 4.0–10.5)

## 2015-03-19 LAB — POCT I-STAT, CHEM 8
BUN: 11 mg/dL (ref 6–23)
BUN: 11 mg/dL (ref 6–23)
CALCIUM ION: 1.11 mmol/L — AB (ref 1.13–1.30)
Calcium, Ion: 1.06 mmol/L — ABNORMAL LOW (ref 1.13–1.30)
Chloride: 104 mmol/L (ref 96–112)
Chloride: 101 mmol/L (ref 96–112)
Creatinine, Ser: 1 mg/dL (ref 0.50–1.35)
Creatinine, Ser: 1 mg/dL (ref 0.50–1.35)
GLUCOSE: 138 mg/dL — AB (ref 70–99)
Glucose, Bld: 127 mg/dL — ABNORMAL HIGH (ref 70–99)
HEMATOCRIT: 25 % — AB (ref 39.0–52.0)
HEMATOCRIT: 26 % — AB (ref 39.0–52.0)
HEMOGLOBIN: 8.5 g/dL — AB (ref 13.0–17.0)
Hemoglobin: 8.8 g/dL — ABNORMAL LOW (ref 13.0–17.0)
Potassium: 3.9 mmol/L (ref 3.5–5.1)
Potassium: 3.4 mmol/L — ABNORMAL LOW (ref 3.5–5.1)
SODIUM: 141 mmol/L (ref 135–145)
Sodium: 137 mmol/L (ref 135–145)
TCO2: 24 mmol/L (ref 0–100)
TCO2: 20 mmol/L (ref 0–100)

## 2015-03-19 LAB — POCT I-STAT 3, ART BLOOD GAS (G3+)
Acid-base deficit: 1 mmol/L (ref 0.0–2.0)
BICARBONATE: 24.8 meq/L — AB (ref 20.0–24.0)
BICARBONATE: 25.1 meq/L — AB (ref 20.0–24.0)
O2 SAT: 100 %
O2 Saturation: 99 %
PCO2 ART: 41.9 mmHg (ref 35.0–45.0)
PCO2 ART: 47.9 mmHg — AB (ref 35.0–45.0)
PH ART: 7.324 — AB (ref 7.350–7.450)
PH ART: 7.379 (ref 7.350–7.450)
PO2 ART: 161 mmHg — AB (ref 80.0–100.0)
Patient temperature: 36.4
TCO2: 27 mmol/L (ref 0–100)
TCO2: 26 mmol/L (ref 0–100)
pO2, Arterial: 197 mmHg — ABNORMAL HIGH (ref 80.0–100.0)

## 2015-03-19 LAB — MAGNESIUM
MAGNESIUM: 2.7 mg/dL — AB (ref 1.5–2.5)
MAGNESIUM: 2.3 mg/dL (ref 1.5–2.5)

## 2015-03-19 LAB — BASIC METABOLIC PANEL
ANION GAP: 6 (ref 5–15)
BUN: 11 mg/dL (ref 6–23)
CO2: 25 mmol/L (ref 19–32)
CREATININE: 1.09 mg/dL (ref 0.50–1.35)
Calcium: 7.5 mg/dL — ABNORMAL LOW (ref 8.4–10.5)
Chloride: 109 mmol/L (ref 96–112)
GFR calc non Af Amer: 64 mL/min — ABNORMAL LOW (ref >90)
GFR, EST AFRICAN AMERICAN: 75 mL/min — AB (ref >90)
Glucose, Bld: 112 mg/dL — ABNORMAL HIGH (ref 70–99)
Potassium: 3.6 mmol/L (ref 3.5–5.1)
Sodium: 140 mmol/L (ref 135–145)

## 2015-03-19 LAB — CREATININE, SERUM
Creatinine, Ser: 1.02 mg/dL (ref 0.50–1.35)
GFR calc Af Amer: 81 mL/min — ABNORMAL LOW (ref >90)
GFR, EST NON AFRICAN AMERICAN: 70 mL/min — AB (ref >90)

## 2015-03-19 MED ORDER — INSULIN DETEMIR 100 UNIT/ML ~~LOC~~ SOLN
20.0000 [IU] | Freq: Once | SUBCUTANEOUS | Status: AC
  Administered 2015-03-19: 20 [IU] via SUBCUTANEOUS
  Filled 2015-03-19: qty 0.2

## 2015-03-19 MED ORDER — INSULIN DETEMIR 100 UNIT/ML ~~LOC~~ SOLN
20.0000 [IU] | Freq: Every day | SUBCUTANEOUS | Status: DC
  Administered 2015-03-20 – 2015-03-21 (×2): 20 [IU] via SUBCUTANEOUS
  Filled 2015-03-19 (×2): qty 0.2

## 2015-03-19 MED ORDER — ENOXAPARIN SODIUM 40 MG/0.4ML ~~LOC~~ SOLN
40.0000 mg | Freq: Every day | SUBCUTANEOUS | Status: DC
  Administered 2015-03-19 – 2015-03-22 (×4): 40 mg via SUBCUTANEOUS
  Filled 2015-03-19 (×5): qty 0.4

## 2015-03-19 MED ORDER — POTASSIUM CHLORIDE 10 MEQ/50ML IV SOLN
10.0000 meq | INTRAVENOUS | Status: AC | PRN
  Administered 2015-03-20 – 2015-03-21 (×3): 10 meq via INTRAVENOUS
  Filled 2015-03-19: qty 50

## 2015-03-19 MED ORDER — INSULIN ASPART 100 UNIT/ML ~~LOC~~ SOLN
0.0000 [IU] | SUBCUTANEOUS | Status: DC
  Administered 2015-03-19 – 2015-03-20 (×3): 2 [IU] via SUBCUTANEOUS

## 2015-03-19 MED ORDER — CETYLPYRIDINIUM CHLORIDE 0.05 % MT LIQD
7.0000 mL | Freq: Two times a day (BID) | OROMUCOSAL | Status: DC
  Administered 2015-03-19 – 2015-03-21 (×5): 7 mL via OROMUCOSAL

## 2015-03-19 MED ORDER — POTASSIUM CHLORIDE 10 MEQ/50ML IV SOLN
10.0000 meq | INTRAVENOUS | Status: AC
  Administered 2015-03-19 (×3): 10 meq via INTRAVENOUS
  Filled 2015-03-19: qty 50

## 2015-03-19 MED ORDER — POTASSIUM CHLORIDE CRYS ER 20 MEQ PO TBCR
40.0000 meq | EXTENDED_RELEASE_TABLET | Freq: Once | ORAL | Status: AC
  Administered 2015-03-19: 40 meq via ORAL
  Filled 2015-03-19: qty 2

## 2015-03-19 MED ORDER — POTASSIUM CHLORIDE 10 MEQ/50ML IV SOLN
INTRAVENOUS | Status: AC
  Administered 2015-03-20: 10 meq via INTRAVENOUS
  Filled 2015-03-19: qty 150

## 2015-03-19 NOTE — Progress Notes (Signed)
1 Day Post-Op Procedure(s) (LRB): CORONARY ARTERY BYPASS GRAFTING (CABG) x  four, LIMA to LAD, SVG-Ramus-dCx, SVG - dRCA using left internal mammary artery and right leg thigh & calf greater saphenous vein harvested endoscopically (N/A) INTRAOPERATIVE TRANSESOPHAGEAL ECHOCARDIOGRAM (N/A) ENDOSCPOIC HARVEST OF RIGHT GREATER SAPHENOUS VEIN (Right) Subjective: No complaints  Objective: Vital signs in last 24 hours: Temp:  [95 F (35 C)-98.1 F (36.7 C)] 97.7 F (36.5 C) (04/09 1100) Pulse Rate:  [79-92] 89 (04/09 1100) Cardiac Rhythm:  [-] Normal sinus rhythm (04/09 1100) Resp:  [0-27] 27 (04/09 1100) BP: (77-109)/(45-67) 102/67 mmHg (04/09 1100) SpO2:  [97 %-100 %] 100 % (04/09 1100) Arterial Line BP: (85-135)/(39-56) 135/52 mmHg (04/09 1100) FiO2 (%):  [40 %-50 %] 40 % (04/08 2310) Weight:  [73.936 kg (163 lb)-81.012 kg (178 lb 9.6 oz)] 81.012 kg (178 lb 9.6 oz) (04/09 0500)  Hemodynamic parameters for last 24 hours: PAP: (15-25)/(5-13) 15/8 mmHg CO:  [5.3 L/min-7.7 L/min] 5.3 L/min CI:  [2.7 L/min/m2-3.9 L/min/m2] 2.7 L/min/m2  Intake/Output from previous day: 04/08 0701 - 04/09 0700 In: 7169.4 [I.V.:3887.4; Blood:672; NG/GT:60; IV Piggyback:2550] Out: 0354 [Urine:5285; Emesis/NG output:100; Blood:2300; Chest Tube:510] Intake/Output this shift: Total I/O In: 455.7 [I.V.:305.7; IV Piggyback:150] Out: 295 [Urine:215; Chest Tube:80]  General appearance: alert and cooperative Neurologic: intact Heart: regular rate and rhythm, S1, S2 normal, no murmur, click, rub or gallop Lungs: clear to auscultation bilaterally Extremities: edema mild Wound: dressings dry  Lab Results:  Recent Labs  03/18/15 2310 03/18/15 2313 03/19/15 0415  WBC 6.6  --  9.4  HGB 9.9* 8.5* 10.0*  HCT 28.1* 25.0* 28.5*  PLT 96*  --  122*   BMET:  Recent Labs  03/16/15 1203  03/18/15 2313 03/19/15 0415  NA 138  < > 141 140  K 3.6  < > 3.9 3.6  CL 101  < > 104 109  CO2 29  --   --  25   GLUCOSE 92  < > 127* 112*  BUN 15  < > 11 11  CREATININE 1.15  < > 1.00 1.09  CALCIUM 9.7  --   --  7.5*  < > = values in this interval not displayed.  PT/INR:  Recent Labs  03/18/15 1720  LABPROT 19.8*  INR 1.66*   ABG    Component Value Date/Time   PHART 7.324* 03/19/2015 0047   HCO3 25.1* 03/19/2015 0047   TCO2 27 03/19/2015 0047   ACIDBASEDEF 1.0 03/19/2015 0047   O2SAT 100.0 03/19/2015 0047   CBG (last 3)   Recent Labs  03/19/15 0509 03/19/15 0615 03/19/15 0652  GLUCAP 114* 149* 136*   CXR: ok  Assessment/Plan: S/P Procedure(s) (LRB): CORONARY ARTERY BYPASS GRAFTING (CABG) x  four, LIMA to LAD, SVG-Ramus-dCx, SVG - dRCA using left internal mammary artery and right leg thigh & calf greater saphenous vein harvested endoscopically (N/A) INTRAOPERATIVE TRANSESOPHAGEAL ECHOCARDIOGRAM (N/A) ENDOSCPOIC HARVEST OF RIGHT GREATER SAPHENOUS VEIN (Right) Mobilize Diuresis once off neo Diabetes control: start levemir and continue SSI. On no meds preop and don't have Hgb A1c since he was emergency. d/c tubes/lines Continue foley due to patient in ICU and urinary output monitoring See progression orders   LOS: 1 day    Gaye Pollack 03/19/2015

## 2015-03-19 NOTE — Progress Notes (Signed)
Patient ID: Bradley Terrell, male   DOB: 11/28/1941, 73 y.o.   MRN: [id number]  SICU Evening Rounds:  Hemodynamically stable on neo 50 mcg  Urine ouput good  Up in chair and comfortable.

## 2015-03-20 ENCOUNTER — Inpatient Hospital Stay (HOSPITAL_COMMUNITY): Payer: Medicare Other

## 2015-03-20 LAB — GLUCOSE, CAPILLARY
GLUCOSE-CAPILLARY: 119 mg/dL — AB (ref 70–99)
GLUCOSE-CAPILLARY: 142 mg/dL — AB (ref 70–99)
Glucose-Capillary: 130 mg/dL — ABNORMAL HIGH (ref 70–99)
Glucose-Capillary: 101 mg/dL — ABNORMAL HIGH (ref 70–99)
Glucose-Capillary: 105 mg/dL — ABNORMAL HIGH (ref 70–99)

## 2015-03-20 LAB — CBC
HEMATOCRIT: 26.3 % — AB (ref 39.0–52.0)
HEMOGLOBIN: 9.3 g/dL — AB (ref 13.0–17.0)
MCH: 32.6 pg (ref 26.0–34.0)
MCHC: 35.4 g/dL (ref 30.0–36.0)
MCV: 92.3 fL (ref 78.0–100.0)
Platelets: 123 10*3/uL — ABNORMAL LOW (ref 150–400)
RBC: 2.85 MIL/uL — ABNORMAL LOW (ref 4.22–5.81)
RDW: 13.7 % (ref 11.5–15.5)
WBC: 12.9 10*3/uL — ABNORMAL HIGH (ref 4.0–10.5)

## 2015-03-20 LAB — BASIC METABOLIC PANEL
Anion gap: 8 (ref 5–15)
BUN: 9 mg/dL (ref 6–23)
CO2: 23 mmol/L (ref 19–32)
CREATININE: 0.97 mg/dL (ref 0.50–1.35)
Calcium: 7.5 mg/dL — ABNORMAL LOW (ref 8.4–10.5)
Chloride: 104 mmol/L (ref 96–112)
GFR calc Af Amer: >90 mL/min (ref >90)
GFR, EST NON AFRICAN AMERICAN: 79 mL/min — AB (ref >90)
Glucose, Bld: 117 mg/dL — ABNORMAL HIGH (ref 70–99)
POTASSIUM: 3.5 mmol/L (ref 3.5–5.1)
SODIUM: 135 mmol/L (ref 135–145)

## 2015-03-20 MED ORDER — INSULIN ASPART 100 UNIT/ML ~~LOC~~ SOLN
0.0000 [IU] | Freq: Three times a day (TID) | SUBCUTANEOUS | Status: DC
  Administered 2015-03-20 – 2015-03-22 (×2): 2 [IU] via SUBCUTANEOUS

## 2015-03-20 NOTE — Progress Notes (Signed)
2 Days Post-Op Procedure(s) (LRB): CORONARY ARTERY BYPASS GRAFTING (CABG) x  four, LIMA to LAD, SVG-Ramus-dCx, SVG - dRCA using left internal mammary artery and right leg thigh & calf greater saphenous vein harvested endoscopically (N/A) INTRAOPERATIVE TRANSESOPHAGEAL ECHOCARDIOGRAM (N/A) ENDOSCPOIC HARVEST OF RIGHT GREATER SAPHENOUS VEIN (Right) Subjective:  No complaints  Objective: Vital signs in last 24 hours: Temp:  [97.7 F (36.5 C)-98.8 F (37.1 C)] 98.1 F (36.7 C) (04/10 0822) Pulse Rate:  [66-92] 78 (04/10 0715) Cardiac Rhythm:  [-] Normal sinus rhythm (04/10 0800) Resp:  [8-30] 30 (04/10 0715) BP: (92-107)/(51-82) 99/82 mmHg (04/10 0100) SpO2:  [85 %-100 %] 96 % (04/10 0715) Arterial Line BP: (81-161)/(29-56) 161/54 mmHg (04/10 0645) Weight:  [81.1 kg (178 lb 12.7 oz)] 81.1 kg (178 lb 12.7 oz) (04/10 0645)  Hemodynamic parameters for last 24 hours: PAP: (15-16)/(6-8) 16/6 mmHg  Intake/Output from previous day: 04/09 0701 - 04/10 0700 In: 1851.7 [P.O.:120; I.V.:1431.7; IV Piggyback:300] Out: 2705 [Urine:2555; Chest Tube:150] Intake/Output this shift: Total I/O In: 20 [I.V.:20] Out: 150 [Urine:150]  General appearance: alert and cooperative Heart: regular rate and rhythm, S1, S2 normal, no murmur, click, rub or gallop Lungs: clear to auscultation bilaterally Extremities: edema mild Wound: Aquacel dressing in place  Lab Results:  Recent Labs  03/19/15 1720 03/19/15 1725 03/20/15 0249  WBC 13.5*  --  12.9*  HGB 9.6* 8.8* 9.3*  HCT 27.2* 26.0* 26.3*  PLT 141*  --  123*   BMET:  Recent Labs  03/19/15 0415  03/19/15 1725 03/20/15 0249  NA 140  --  137 135  K 3.6  --  3.4* 3.5  CL 109  --  101 104  CO2 25  --   --  23  GLUCOSE 112*  --  138* 117*  BUN 11  --  11 9  CREATININE 1.09  < > 1.00 0.97  CALCIUM 7.5*  --   --  7.5*  < > = values in this interval not displayed.  PT/INR:  Recent Labs  03/18/15 1720  LABPROT 19.8*  INR 1.66*   ABG     Component Value Date/Time   PHART 7.324* 03/19/2015 0047   HCO3 25.1* 03/19/2015 0047   TCO2 20 03/19/2015 1725   ACIDBASEDEF 1.0 03/19/2015 0047   O2SAT 100.0 03/19/2015 0047   CBG (last 3)   Recent Labs  03/19/15 2313 03/20/15 0415 03/20/15 0819  GLUCAP 127* 119* 130*    Assessment/Plan: S/P Procedure(s) (LRB): CORONARY ARTERY BYPASS GRAFTING (CABG) x  four, LIMA to LAD, SVG-Ramus-dCx, SVG - dRCA using left internal mammary artery and right leg thigh & calf greater saphenous vein harvested endoscopically (N/A) INTRAOPERATIVE TRANSESOPHAGEAL ECHOCARDIOGRAM (N/A) ENDOSCPOIC HARVEST OF RIGHT GREATER SAPHENOUS VEIN (Right)  He is hemodynamically stable. Neo just turned off this am so will monitor BP in the ICU today. Hold off on beta blocker and diuretic today until we are sure BP is stable.  DM: glucose under good control  Continue mobilization, IS   LOS: 2 days    Gaye Pollack 03/20/2015

## 2015-03-20 NOTE — Progress Notes (Signed)
Patient ID: Bradley Terrell, male   DOB: 07-04-1941, 74 y.o.   MRN: [id number] SICU Evening Rounds:  Hemodynamically stable with neo off. Urine ouput ok

## 2015-03-21 ENCOUNTER — Encounter (HOSPITAL_COMMUNITY): Payer: Self-pay | Admitting: Cardiothoracic Surgery

## 2015-03-21 LAB — BASIC METABOLIC PANEL
ANION GAP: 4 — AB (ref 5–15)
BUN: 11 mg/dL (ref 6–23)
CHLORIDE: 103 mmol/L (ref 96–112)
CO2: 28 mmol/L (ref 19–32)
Calcium: 7.4 mg/dL — ABNORMAL LOW (ref 8.4–10.5)
Creatinine, Ser: 1 mg/dL (ref 0.50–1.35)
GFR, EST AFRICAN AMERICAN: 83 mL/min — AB (ref >90)
GFR, EST NON AFRICAN AMERICAN: 71 mL/min — AB (ref >90)
Glucose, Bld: 102 mg/dL — ABNORMAL HIGH (ref 70–99)
Potassium: 3.7 mmol/L (ref 3.5–5.1)
SODIUM: 135 mmol/L (ref 135–145)

## 2015-03-21 LAB — GLUCOSE, CAPILLARY
GLUCOSE-CAPILLARY: 76 mg/dL (ref 70–99)
Glucose-Capillary: 83 mg/dL (ref 70–99)
Glucose-Capillary: 82 mg/dL (ref 70–99)

## 2015-03-21 MED ORDER — FUROSEMIDE 10 MG/ML IJ SOLN
20.0000 mg | Freq: Once | INTRAMUSCULAR | Status: AC
  Administered 2015-03-21: 20 mg via INTRAVENOUS

## 2015-03-21 MED ORDER — POTASSIUM CHLORIDE 10 MEQ/50ML IV SOLN
10.0000 meq | INTRAVENOUS | Status: DC | PRN
  Administered 2015-03-21 (×2): 10 meq via INTRAVENOUS
  Filled 2015-03-21: qty 50

## 2015-03-21 MED ORDER — AMIODARONE HCL IN DEXTROSE 360-4.14 MG/200ML-% IV SOLN
INTRAVENOUS | Status: AC
  Administered 2015-03-21: 60 mg/h via INTRAVENOUS
  Filled 2015-03-21: qty 200

## 2015-03-21 MED ORDER — ALUM & MAG HYDROXIDE-SIMETH 200-200-20 MG/5ML PO SUSP: 15.0000 mL | ORAL | Status: DC | PRN

## 2015-03-21 MED ORDER — POTASSIUM CHLORIDE CRYS ER 20 MEQ PO TBCR
20.0000 meq | EXTENDED_RELEASE_TABLET | Freq: Every day | ORAL | Status: DC
Start: 2015-03-22 — End: 2015-03-23
  Administered 2015-03-22 – 2015-03-23 (×2): 20 meq via ORAL
  Filled 2015-03-21 (×2): qty 1

## 2015-03-21 MED ORDER — MAGNESIUM HYDROXIDE 400 MG/5ML PO SUSP: 30.0000 mL | Freq: Every day | ORAL | Status: DC | PRN

## 2015-03-21 MED ORDER — AMIODARONE LOAD VIA INFUSION
150.0000 mg | Freq: Once | INTRAVENOUS | Status: AC
Start: 2015-03-21 — End: 2015-03-21
  Administered 2015-03-21: 150 mg via INTRAVENOUS
  Filled 2015-03-21: qty 83.34

## 2015-03-21 MED ORDER — INSULIN DETEMIR 100 UNIT/ML ~~LOC~~ SOLN
18.0000 [IU] | Freq: Every day | SUBCUTANEOUS | Status: DC
  Filled 2015-03-21: qty 0.18

## 2015-03-21 MED ORDER — SODIUM CHLORIDE 0.9 % IJ SOLN
3.0000 mL | Freq: Two times a day (BID) | INTRAMUSCULAR | Status: DC
  Administered 2015-03-21: 3 mL via INTRAVENOUS

## 2015-03-21 MED ORDER — AMIODARONE HCL IN DEXTROSE 360-4.14 MG/200ML-% IV SOLN
30.0000 mg/h | INTRAVENOUS | Status: AC
  Administered 2015-03-22 (×3): 30 mg/h via INTRAVENOUS
  Filled 2015-03-21 (×6): qty 200

## 2015-03-21 MED ORDER — FUROSEMIDE 40 MG PO TABS
40.0000 mg | ORAL_TABLET | Freq: Every day | ORAL | Status: DC
  Administered 2015-03-22 – 2015-03-23 (×2): 40 mg via ORAL
  Filled 2015-03-21 (×2): qty 1

## 2015-03-21 MED ORDER — ALBUTEROL SULFATE (2.5 MG/3ML) 0.083% IN NEBU
2.5000 mg | INHALATION_SOLUTION | RESPIRATORY_TRACT | Status: DC | PRN
Start: 2015-03-21 — End: 2015-03-23

## 2015-03-21 MED ORDER — AMIODARONE HCL IN DEXTROSE 360-4.14 MG/200ML-% IV SOLN
60.0000 mg/h | INTRAVENOUS | Status: AC
  Administered 2015-03-21: 60 mg/h via INTRAVENOUS

## 2015-03-21 MED ORDER — MOVING RIGHT ALONG BOOK
Freq: Once | Status: AC
  Administered 2015-03-21: 1
  Filled 2015-03-21: qty 1

## 2015-03-21 MED ORDER — SODIUM CHLORIDE 0.9 % IJ SOLN: 3.0000 mL | INTRAMUSCULAR | Status: DC | PRN

## 2015-03-21 MED ORDER — FEBUXOSTAT 40 MG PO TABS
40.0000 mg | ORAL_TABLET | Freq: Every day | ORAL | Status: DC
  Administered 2015-03-21 – 2015-03-23 (×3): 40 mg via ORAL
  Filled 2015-03-21 (×3): qty 1

## 2015-03-21 MED ORDER — SODIUM CHLORIDE 0.9 % IV SOLN: 250.0000 mL | INTRAVENOUS | Status: DC | PRN

## 2015-03-21 MED FILL — Lidocaine HCl IV Inj 20 MG/ML: INTRAVENOUS | Qty: 5 | Status: AC

## 2015-03-21 MED FILL — Heparin Sodium (Porcine) Inj 1000 Unit/ML: INTRAMUSCULAR | Qty: 10 | Status: AC

## 2015-03-21 MED FILL — Sodium Chloride IV Soln 0.9%: INTRAVENOUS | Qty: 2000 | Status: AC

## 2015-03-21 MED FILL — Sodium Bicarbonate IV Soln 8.4%: INTRAVENOUS | Qty: 50 | Status: AC

## 2015-03-21 MED FILL — Mannitol IV Soln 20%: INTRAVENOUS | Qty: 500 | Status: AC

## 2015-03-21 MED FILL — Electrolyte-R (PH 7.4) Solution: INTRAVENOUS | Qty: 4000 | Status: AC

## 2015-03-21 NOTE — Progress Notes (Signed)
Pt rhythm Atrial fibrillation with rapid ventricular response. Rate 150-170. Pt unaware. Vagal maneuver attempted, and unsuccessful in converting rhythm to normal or slowing heart rate. Pt placed on 2 liters nasal canula. Dr Roxan Hockey notified, orders received. Will continue to monitor.

## 2015-03-21 NOTE — Plan of Care (Signed)
Problem: Phase II - Intermediate Post-Op Goal: Maintain Hemodynamic Stability Outcome: Progressing Patients hemodynamic status stable without being on any pressors or inotropes.  Problem: Phase III - Recovery through Discharge Goal: Maintain Hemodynamic Stability Patients hemodynamic status stable without being on any pressors or inotropes.

## 2015-03-21 NOTE — Progress Notes (Signed)
      [address]       [place] [address]             [phone]      Up in chair  BP 114/76 mmHg  Pulse 85  Temp(Src) 98.4 F (36.9 C) (Oral)  Resp 23  Ht 6' (1.829 m)  Wt 181 lb (82.1 kg)  BMI 24.54 kg/m2  SpO2 97%   Intake/Output Summary (Last 24 hours) at 03/21/15 1733 Last data filed at 03/21/15 1600  Gross per 24 hour  Intake    980 ml  Output   2925 ml  Net  -1945 ml    Doing well  Awaiting step down bed  Remo Lipps C. Roxan Hockey, MD [facility] [phone]

## 2015-03-21 NOTE — Op Note (Signed)
Bradley Terrell, Bradley Terrell              ACCOUNT NO.:  [id number]  MEDICAL RECORD NO.:  [id number]  LOCATION:  2S04C                        FACILITY:  [facility]  PHYSICIAN:  Lanelle Bal, MD    DATE OF BIRTH:  1941-04-11  DATE OF PROCEDURE:  03/18/2015 DATE OF DISCHARGE:                              OPERATIVE REPORT   PREOPERATIVE DIAGNOSIS:  Critical left main and ostial right disease with positive stress test.  POSTOPERATIVE DIAGNOSIS:  Critical left main and ostial right disease with positive stress test.  SURGICAL PROCEDURE:  Emergency coronary artery bypass grafting for critical anatomy.  Coronary artery bypass grafting x4 with left internal mammary to the left anterior descending coronary artery, sequential reverse saphenous vein graft to the intermediate coronary artery and circumflex coronary artery, reverse saphenous vein graft to the distal right coronary artery with right thigh and calf greater saphenous vein harvesting endoscopically.  SURGEON:  Lanelle Bal, MD  FIRST ASSISTANT:  Suzzanne Cloud, PA  BRIEF HISTORY:  The patient is a 74 year old male with known history of coronary occlusive disease having found many years ago in the 38s in [place].  Recently, he noted increasing episodes of shortness of breath and underwent a stress test, which was demonstrated as intermediate probability of coronary disease and an elective cardiac catheterization was performed the morning of surgery by Dr. Irish Lack. At the time of catheterization, he was found to have preserved LV function and a greater than a 99% distal left main obstruction and a 99% proximal right coronary obstruction.  The patient was stable hemodynamically.  Because of the critical anatomy, emergency coronary artery bypass grafting was recommended to the patient who agreed and signed informed consent.  DESCRIPTION OF PROCEDURE:  With Swan-Ganz and arterial line monitors in place, the patient underwent  general endotracheal anesthesia without incidence.  Skin of the chest and legs were prepped with Betadine and draped in usual sterile manner.  TEE probe was placed by Anesthesia, confirmed overall preserved LV function, and normal functioning valves. Appropriate time-out was performed and we proceeded with Endovein harvesting of the right thigh and calf.  The vein was of adequate quality and caliber.  Median sternotomy was performed.  Left internal mammary artery was dissected down as a pedicle graft.  The distal artery was divided, had good free flow.  The pericardium was opened.  Overall, ventricular function appeared preserved.  The patient was systemically heparinized.  The ascending aorta was cannulated.  The right atrium was cannulated and aortic root vent cardioplegia needle was introduced into the ascending aorta.  The patient was placed on cardiopulmonary bypass 2.4 L/min/m2.  Sites of anastomosis were selected and dissected out of the epicardium.  The patient's body temperature was cooled to 32 degrees.  Aortic cross-clamp was applied and 600 mL of cold blood potassium cardioplegia was administered with diastolic arrest of the heart.  Myocardial septal temperature was monitored throughout the crossclamp.  Attention was turned first to the distal right coronary artery, which was opened and admitted a 1.5 mm probe.  Using a running 7- 0 Prolene, distal anastomosis was performed.  Additional cold blood cardioplegia was administered intermittently down the vein graft. Attention was then turned to the  lateral wall.  The intermediate coronary artery was partially intramyocardial, vessel was opened, admitted a 1.5 mm probe.  Using a diamond-type side-to-side anastomosis was carried out with a running 7-0 Prolene.  Distal extent of the same vein was then carried to the large distal circumflex vessel, which was approximately 1.8 mm in size.  Distally using a running 7-0 Prolene, distal  anastomosis was performed.  Between the mid and distal third of the left anterior descending coronary artery, the vessel was opened and admitted a 1.5 mm probe proximally and distally.  Using a running 8-0 Prolene, left internal mammary artery was anastomosed to the left anterior descending coronary artery.  At the completion of the anastomosis, the bulldog was released on the mammary artery, there was septal temperature rise, but not as quickly as anticipated with a good flow from the mammary, so we gave additional blood cardioplegia, took down the anastomosis, and trimmed the mammary artery back to an area where it was slightly larger, and performed anastomosis with a running 8- 0 Prolene.  With the 2nd anastomosis, the bulldog was removed, and there was prompt rise in myocardial septal temperature.  Bulldog was placed back on the mammary artery and with crossclamp still in place, 2 punch aortotomies were performed.  Each of the two vein grafts were anastomosed to the ascending aorta.  Air was evacuated from the grafts and ascending aorta.  Bulldog was removed from the mammary artery with prompt rise in myocardial septal temperature.  Aortic crossclamp was removed.  Total cross-clamp time of 94 minutes.  The patient spontaneously converted to a sinus rhythm.  Sites of anastomosis were inspected and were free of bleeding.  The patient was then ventilated and with the body temperature rewarmed to 37 degrees, he was weaned from cardiopulmonary bypass and remained hemodynamically stable.  He was decannulated in usual fashion.  Protamine sulfate was administered. Atrial and ventricular pacing wires were applied.  Graft markers were applied with the operative field hemostatic.  A left pleural tube and a Blake mediastinal drain were left in place.  Pericardium was loosely reapproximated.  Sternum was closed with #6 stainless steel wire. Fascia was closed with interrupted 0-Vicryl, running 3-0  Vicryl in subcutaneous tissue, and 3-0 subcuticular stitch in skin edges.  Dry dressings were applied.  Sponge and needle count was reported as correct at the completion of procedure.  The patient tolerated the procedure without obvious complications and was transferred to Surgical Intensive Care Unit for further postoperative care.  Total pump time was 119 minutes.  The patient did not require any blood products during the operative procedure.     Lanelle Bal, MD     EG/MEDQ  D:  03/20/2015  T:  03/21/2015  Job:  [id number]

## 2015-03-21 NOTE — Progress Notes (Signed)
3 Days Post-Op Procedure(s) (LRB): CORONARY ARTERY BYPASS GRAFTING (CABG) x  four, LIMA to LAD, SVG-Ramus-dCx, SVG - dRCA using left internal mammary artery and right leg thigh & calf greater saphenous vein harvested endoscopically (N/A) INTRAOPERATIVE TRANSESOPHAGEAL ECHOCARDIOGRAM (N/A) ENDOSCPOIC HARVEST OF RIGHT GREATER SAPHENOUS VEIN (Right) Subjective: emerg cabg for L main nsr Ambulating in hall Wt up 10 lbsstable  Objective: Vital signs in last 24 hours: Temp:  [97.6 F (36.4 C)-98.6 F (37 C)] 98.2 F (36.8 C) (04/11 0747) Pulse Rate:  [72-95] 85 (04/11 1000) Cardiac Rhythm:  [-] Normal sinus rhythm (04/11 0800) Resp:  [12-29] 27 (04/11 1000) BP: (99-145)/(42-76) 99/76 mmHg (04/11 1000) SpO2:  [56 %-100 %] 98 % (04/11 1000) Arterial Line BP: (115-154)/(36-73) 154/45 mmHg (04/10 1400) Weight:  [181 lb (82.1 kg)] 181 lb (82.1 kg) (04/11 0015)  Hemodynamic parameters for last 24 hours:    Intake/Output from previous day: 04/10 0701 - 04/11 0700 In: 1535 [P.O.:960; I.V.:475; IV Piggyback:100] Out: 1190 [Urine:1190] Intake/Output this shift: Total I/O In: 70 [I.V.:70] Out: 775 [Urine:775]  Neuro intact Lungs clear extrem warm  Lab Results:  Recent Labs  03/19/15 1720 03/19/15 1725 03/20/15 0249  WBC 13.5*  --  12.9*  HGB 9.6* 8.8* 9.3*  HCT 27.2* 26.0* 26.3*  PLT 141*  --  123*   BMET:  Recent Labs  03/20/15 0249 03/21/15 0354  NA 135 135  K 3.5 3.7  CL 104 103  CO2 23 28  GLUCOSE 117* 102*  BUN 9 11  CREATININE 0.97 1.00  CALCIUM 7.5* 7.4*    PT/INR:  Recent Labs  03/18/15 1720  LABPROT 19.8*  INR 1.66*   ABG    Component Value Date/Time   PHART 7.324* 03/19/2015 0047   HCO3 25.1* 03/19/2015 0047   TCO2 20 03/19/2015 1725   ACIDBASEDEF 1.0 03/19/2015 0047   O2SAT 100.0 03/19/2015 0047   CBG (last 3)   Recent Labs  03/20/15 1618 03/20/15 2150 03/21/15 0745  GLUCAP 101* 105* 83    Assessment/Plan: S/P Procedure(s)  (LRB): CORONARY ARTERY BYPASS GRAFTING (CABG) x  four, LIMA to LAD, SVG-Ramus-dCx, SVG - dRCA using left internal mammary artery and right leg thigh & calf greater saphenous vein harvested endoscopically (N/A) INTRAOPERATIVE TRANSESOPHAGEAL ECHOCARDIOGRAM (N/A) ENDOSCPOIC HARVEST OF RIGHT GREATER SAPHENOUS VEIN (Right) Mobilize Diuresis Plan for transfer to step-down: see transfer orders   LOS: 3 days    Bradley Terrell 03/21/2015

## 2015-03-22 ENCOUNTER — Inpatient Hospital Stay (HOSPITAL_COMMUNITY): Payer: Medicare Other

## 2015-03-22 LAB — CBC
HCT: 25.2 % — ABNORMAL LOW (ref 39.0–52.0)
Hemoglobin: 8.8 g/dL — ABNORMAL LOW (ref 13.0–17.0)
MCH: 32.5 pg (ref 26.0–34.0)
MCHC: 34.9 g/dL (ref 30.0–36.0)
MCV: 93 fL (ref 78.0–100.0)
Platelets: 130 10*3/uL — ABNORMAL LOW (ref 150–400)
RBC: 2.71 MIL/uL — ABNORMAL LOW (ref 4.22–5.81)
RDW: 13.4 % (ref 11.5–15.5)
WBC: 5.2 10*3/uL (ref 4.0–10.5)

## 2015-03-22 LAB — TYPE AND SCREEN
ABO/RH(D): A POS
Antibody Screen: NEGATIVE
Unit division: 0
Unit division: 0
Unit division: 0
Unit division: 0
Unit division: 0
Unit division: 0

## 2015-03-22 LAB — BASIC METABOLIC PANEL
Anion gap: 5 (ref 5–15)
BUN: 12 mg/dL (ref 6–23)
CO2: 27 mmol/L (ref 19–32)
Calcium: 7.6 mg/dL — ABNORMAL LOW (ref 8.4–10.5)
Chloride: 105 mmol/L (ref 96–112)
Creatinine, Ser: 1.16 mg/dL (ref 0.50–1.35)
GFR calc Af Amer: 69 mL/min — ABNORMAL LOW (ref >90)
GFR calc non Af Amer: 60 mL/min — ABNORMAL LOW (ref >90)
Glucose, Bld: 92 mg/dL (ref 70–99)
Potassium: 4 mmol/L (ref 3.5–5.1)
Sodium: 137 mmol/L (ref 135–145)

## 2015-03-22 LAB — GLUCOSE, CAPILLARY
GLUCOSE-CAPILLARY: 79 mg/dL (ref 70–99)
GLUCOSE-CAPILLARY: 137 mg/dL — AB (ref 70–99)
Glucose-Capillary: 95 mg/dL (ref 70–99)
Glucose-Capillary: 128 mg/dL — ABNORMAL HIGH (ref 70–99)

## 2015-03-22 MED ORDER — CARVEDILOL 3.125 MG PO TABS
3.1250 mg | ORAL_TABLET | Freq: Two times a day (BID) | ORAL | Status: DC
  Administered 2015-03-22 – 2015-03-23 (×4): 3.125 mg via ORAL
  Filled 2015-03-22 (×6): qty 1

## 2015-03-22 MED ORDER — LISINOPRIL 2.5 MG PO TABS
2.5000 mg | ORAL_TABLET | Freq: Every day | ORAL | Status: DC
  Administered 2015-03-22: 2.5 mg via ORAL
  Filled 2015-03-22 (×2): qty 1

## 2015-03-22 MED ORDER — AMIODARONE HCL 200 MG PO TABS
400.0000 mg | ORAL_TABLET | Freq: Two times a day (BID) | ORAL | Status: DC
  Administered 2015-03-22 – 2015-03-23 (×2): 400 mg via ORAL
  Filled 2015-03-22 (×3): qty 2

## 2015-03-22 MED ORDER — METOPROLOL TARTRATE 1 MG/ML IV SOLN
5.0000 mg | Freq: Once | INTRAVENOUS | Status: AC
  Administered 2015-03-22: 5 mg via INTRAVENOUS
  Filled 2015-03-22: qty 5

## 2015-03-22 NOTE — Progress Notes (Addendum)
[facility] DAILY ICU PROGRESS NOTE                   [address]            [place] [address]          [phone]   4 Days Post-Op Procedure(s) (LRB): CORONARY ARTERY BYPASS GRAFTING (CABG) x  four, LIMA to LAD, SVG-Ramus-dCx, SVG - dRCA using left internal mammary artery and right leg thigh & calf greater saphenous vein harvested endoscopically (N/A) INTRAOPERATIVE TRANSESOPHAGEAL ECHOCARDIOGRAM (N/A) ENDOSCPOIC HARVEST OF RIGHT GREATER SAPHENOUS VEIN (Right)  Total Length of Stay:  LOS: 4 days   Subjective: conts to do well  Objective: Vital signs in last 24 hours: Temp:  [97.9 F (36.6 C)-98.9 F (37.2 C)] 98.2 F (36.8 C) (04/12 0400) Pulse Rate:  [66-104] 76 (04/12 0700) Cardiac Rhythm:  [-] Normal sinus rhythm (04/11 2229) Resp:  [14-30] 24 (04/12 0700) BP: (99-152)/(48-132) 125/72 mmHg (04/12 0700) SpO2:  [92 %-100 %] 99 % (04/12 0700) Weight:  [176 lb 9.4 oz (80.1 kg)] 176 lb 9.4 oz (80.1 kg) (04/12 0200)  Filed Weights   03/20/15 0645 03/21/15 0015 03/22/15 0200  Weight: 178 lb 12.7 oz (81.1 kg) 181 lb (82.1 kg) 176 lb 9.4 oz (80.1 kg)    Weight change: -4 lb 6.5 oz (-2 kg)   Hemodynamic parameters for last 24 hours:    Intake/Output from previous day: 04/11 0701 - 04/12 0700 In: 860 [P.O.:720; I.V.:90; IV Piggyback:50] Out: 1540 [Urine:3575]  Intake/Output this shift:    Current Meds: Scheduled Meds: . acetaminophen  1,000 mg Oral 4 times per day   Or  . acetaminophen (TYLENOL) oral liquid 160 mg/5 mL  1,000 mg Per Tube 4 times per day  . antiseptic oral rinse  7 mL Mouth Rinse BID  . aspirin EC  325 mg Oral Daily   Or  . aspirin  324 mg Per Tube Daily  . atorvastatin  80 mg Oral Daily  . bisacodyl  10 mg Oral Daily   Or  . bisacodyl  10 mg Rectal Daily  . docusate sodium  200 mg Oral Daily  . enoxaparin (LOVENOX) injection  40 mg Subcutaneous QHS  . febuxostat  40 mg Oral Daily  . furosemide  40 mg Oral Daily  . insulin aspart   0-24 Units Subcutaneous TID WC  . insulin detemir  18 Units Subcutaneous Daily  . pantoprazole  40 mg Oral Daily  . potassium chloride  20 mEq Oral Daily  . sodium chloride  10-40 mL Intracatheter Q12H  . sodium chloride  3 mL Intravenous Q12H  . sodium chloride  3 mL Intravenous Q12H   Continuous Infusions: . amiodarone 30 mg/hr (03/22/15 0708)  . lactated ringers Stopped (03/19/15 1700)   PRN Meds:.sodium chloride, albuterol, alum & mag hydroxide-simeth, magnesium hydroxide, ondansetron (ZOFRAN) IV, oxyCODONE, potassium chloride, sodium chloride, sodium chloride, sodium chloride, traMADol  General appearance: alert, cooperative and no distress Heart: regular rate and rhythm Lungs: milldly dim in the bases Abdomen: benign Extremities: minor edema  Wound: incis healing well  Lab Results: CBC: Recent Labs  03/20/15 0249 03/22/15 0333  WBC 12.9* 5.2  HGB 9.3* 8.8*  HCT 26.3* 25.2*  PLT 123* 130*   BMET:  Recent Labs  03/21/15 0354 03/22/15 0333  NA 135 137  K 3.7 4.0  CL 103 105  CO2 28 27  GLUCOSE 102* 92  BUN 11 12  CREATININE 1.00 1.16  CALCIUM 7.4* 7.6*    PT/INR: No results for input(s): LABPROT, INR in the last 72 hours. Radiology: No results found.   Assessment/Plan: S/P Procedure(s) (LRB): CORONARY ARTERY BYPASS GRAFTING (CABG) x  four, LIMA to LAD, SVG-Ramus-dCx, SVG - dRCA using left internal mammary artery and right leg thigh & calf greater saphenous vein harvested endoscopically (N/A) INTRAOPERATIVE TRANSESOPHAGEAL ECHOCARDIOGRAM (N/A) ENDOSCPOIC HARVEST OF RIGHT GREATER SAPHENOUS VEIN (Right)  1 conts with excellent progress 2 conts rehab/pulm toilet 3 labs stable 4 in sinus rhythm, will need to transition to po amiodarone after IV loading 5 will add lose dose ace inhibitor   GOLD,WAYNE E 03/22/2015 7:40 AM  Patient seen and examined, agree with above Had some a fib last night Will change amiodarone to PO this evening, restart  coreg Transfer to [place]. Roxan Hockey, MD [facility] [phone]

## 2015-03-22 NOTE — Progress Notes (Signed)
Utilization review completed.  

## 2015-03-22 NOTE — Significant Event (Signed)
1815pm-ambulated in the halls for 15 minutes with NT Alice and family. Patient tolerated ambulation well. Remained in sinus in the 70s. Returned to room and settled in bed.

## 2015-03-22 NOTE — Significant Event (Signed)
Accepted patient into room 2W08, transferred from 2S. Patient ambulated here with RN Stanton Kidney and family. Settled in bed, placed on telemetry. No complaints at this time. Patient oriented to room and unit. Bradley Terrell, Therapist, sports

## 2015-03-22 NOTE — Evaluation (Signed)
Physical Therapy Evaluation Patient Details Name: Bradley Terrell MRN: [id number] DOB: 1941/01/08 Today's Date: 03/22/2015   History of Present Illness  s/p cardiac cath 4/8 with severe CAD and went to have CABG x4 4/8; post-op afib with RVR  PMHx-CAD with cardiac procedure 1991; HTN  Clinical Impression  Patient evaluated by Physical Therapy with no further acute PT needs identified. All education has been completed and the patient has no further questions.  PT is signing off. Thank you for this referral.     Follow Up Recommendations No PT follow up    Equipment Recommendations  None recommended by PT    Recommendations for Other Services       Precautions / Restrictions Precautions Precautions: Sternal Precaution Comments: pt able to verbalize precautions and possible complications if not adhered to Restrictions Weight Bearing Restrictions: Yes (sternal precautions)      Mobility  Bed Mobility               General bed mobility comments: discussed proper technique to avoid use of UEs; pt up in recliner and deferred bed mobility  Transfers Overall transfer level: Needs assistance Equipment used: None Transfers: Sit to/from Stand Sit to Stand: Supervision         General transfer comment: vc for no pushing with UEs; otherwise steady  Ambulation/Gait Ambulation/Gait assistance: Min guard Ambulation Distance (Feet): 1000 Feet Assistive device: None Gait Pattern/deviations: Step-through pattern;Drifts right/left   Gait velocity interpretation: at or above normal speed for age/gender General Gait Details: supervision when pushing IV pole and maintained straight path; when not holding IV pole, slight drifting (and near stagger x 2) Lt and Rt (especially with head turns--did not require physical assist to recover  Stairs            Wheelchair Mobility    Modified Rankin (Stroke Patients Only)       Balance Overall balance assessment: Needs  assistance                           High level balance activites: Head turns;Turns (changes in velocity) High Level Balance Comments: see gait comments; good ability to vary velocity             Pertinent Vitals/Pain Pain Assessment: No/denies pain    Home Living Family/patient expects to be discharged to:: Private residence Living Arrangements: Spouse/significant other Available Help at Discharge: Family;Available 24 hours/day                  Prior Function Level of Independence: Independent         Comments: retired; walks; yard work     Journalist, newspaper        Extremity/Trunk Assessment   Upper Extremity Assessment: Overall WFL for tasks assessed           Lower Extremity Assessment: Overall WFL for tasks assessed      Cervical / Trunk Assessment: Normal  Communication   Communication: No difficulties  Cognition Arousal/Alertness: Awake/alert Behavior During Therapy: WFL for tasks assessed/performed Overall Cognitive Status: Within Functional Limits for tasks assessed                      General Comments      Exercises        Assessment/Plan    PT Assessment Patent does not need any further PT services  PT Diagnosis Difficulty walking   PT Problem List    PT  Treatment Interventions     PT Goals (Current goals can be found in the Care Plan section) Acute Rehab PT Goals PT Goal Formulation: All assessment and education complete, DC therapy    Frequency     Barriers to discharge        Co-evaluation               End of Session   Activity Tolerance:  (HR incr 124-157; RN aware and with pt/PT; "his normal") Patient left: in chair;with nursing/sitter in room Nurse Communication:  (no further PT needs)         Time: 1020-1039 PT Time Calculation (min) (ACUTE ONLY): 19 min   Charges:   PT Evaluation $Initial PT Evaluation Tier I: 1 Procedure     PT G Codes:        Laquasha Groome Apr 07, 2015,  10:55 AM Pager [phone]

## 2015-03-23 ENCOUNTER — Telehealth: Payer: Self-pay | Admitting: *Deleted

## 2015-03-23 DIAGNOSIS — Z951 Presence of aortocoronary bypass graft: Secondary | ICD-10-CM | POA: Insufficient documentation

## 2015-03-23 LAB — GLUCOSE, CAPILLARY
GLUCOSE-CAPILLARY: 104 mg/dL — AB (ref 70–99)
GLUCOSE-CAPILLARY: 103 mg/dL — AB (ref 70–99)

## 2015-03-23 MED ORDER — FERROUS SULFATE 325 (65 FE) MG PO TABS: 325.0000 mg | ORAL_TABLET | Freq: Every day | ORAL | Status: DC

## 2015-03-23 MED ORDER — LISINOPRIL 5 MG PO TABS
5.0000 mg | ORAL_TABLET | Freq: Every day | ORAL | Status: DC
  Administered 2015-03-23: 5 mg via ORAL
  Filled 2015-03-23: qty 1

## 2015-03-23 MED ORDER — OXYCODONE HCL 5 MG PO TABS: 5.0000 mg | ORAL_TABLET | ORAL | Status: DC | PRN

## 2015-03-23 MED ORDER — POTASSIUM CHLORIDE CRYS ER 20 MEQ PO TBCR: 20.0000 meq | EXTENDED_RELEASE_TABLET | Freq: Every day | ORAL | Status: DC

## 2015-03-23 MED ORDER — FOLIC ACID 1 MG PO TABS
1.0000 mg | ORAL_TABLET | Freq: Every day | ORAL | Status: DC
Start: 2015-03-23 — End: 2015-05-19

## 2015-03-23 MED ORDER — LISINOPRIL 5 MG PO TABS: 5.0000 mg | ORAL_TABLET | Freq: Every day | ORAL | Status: DC

## 2015-03-23 MED ORDER — FOLIC ACID 1 MG PO TABS
1.0000 mg | ORAL_TABLET | Freq: Every day | ORAL | Status: DC
  Administered 2015-03-23: 1 mg via ORAL
  Filled 2015-03-23: qty 1

## 2015-03-23 MED ORDER — FUROSEMIDE 40 MG PO TABS: 40.0000 mg | ORAL_TABLET | Freq: Every day | ORAL | Status: DC

## 2015-03-23 MED ORDER — FERROUS SULFATE 325 (65 FE) MG PO TABS
325.0000 mg | ORAL_TABLET | Freq: Every day | ORAL | Status: DC
  Filled 2015-03-23: qty 1

## 2015-03-23 MED ORDER — ASPIRIN 325 MG PO TBEC: 325.0000 mg | DELAYED_RELEASE_TABLET | Freq: Every day | ORAL | Status: DC

## 2015-03-23 MED ORDER — AMIODARONE HCL 200 MG PO TABS: 200.0000 mg | ORAL_TABLET | Freq: Two times a day (BID) | ORAL | Status: DC

## 2015-03-23 NOTE — Progress Notes (Signed)
Medicare Important Message given? YES  (If response is "NO", the following Medicare IM given date fields will be blank)  Date Medicare IM given: 03/23/15 Medicare IM given by:  Dahlia Client [employer]

## 2015-03-23 NOTE — Progress Notes (Signed)
REMOVED BILATERAL PACING WIRES AND CTS. PT TOLERATE WELL. NO S/S OF DISTRESS. VS OBTAINED Q 15MIN. PT PLACED ON BEDREST FOR 1HR. MONITORING WILL CONTINUE.

## 2015-03-23 NOTE — Discharge Summary (Signed)
Physician Discharge Summary       [address]       [place] [address]             [phone]    Patient ID: Bradley Terrell MRN: [id number] DOB/AGE: 02-18-1941 74 y.o.  Admit date: 03/18/2015 Discharge date: 03/23/2015  Admission Diagnoses: 1. Coronary artery disease (S/p atherectomy in 91',left main disease included) 2.  History of hypertension 3. History of Gilbert syndrome 4. History of left renal cyst  Discharge Diagnoses:  1. Coronary artery disease (S/p atherectomy in 91',left main disease included) 2.  History of hypertension 3. History of Gilbert syndrome 4. History of left renal cyst 5. ABL anemia 6. Mild thrombocytopenia  Procedure (s):  Cardiac catheterization done by Dr. Irish Lack on 03/18/2015: HEMODYNAMICS: Aortic pressure was 125/60; LV pressure was 118/4; LVEDP 18. There was no gradient between the left ventricle and aorta.   ANGIOGRAPHIC DATA: The left main coronary artery is a large vessel with a 95% distal stenosis. The vessel is heavily calcified.  The left anterior descending artery is there is severe ostial disease of the LAD from the plaque which extends from the left main. There is mild to moderate diffuse calcific plaque in the proximal LAD. The mid to distal LAD has only mild disease. The first diagonal is large and appears patent.  The left circumflex artery is a large vessel. There is an ostial stenosis which is severe from the left main plaque extending into the circumflex. There is a small ramus which also has a severe ostial stenosis which is patent. The first significant OM is large and widely patent. There are left to right collaterals from the circumflex which feed into the distal RCA system.  The right coronary artery is a medium-sized vessel. There is a severely calcified subtotal occlusion proximally, 99%. The posterior descending artery bifurcates from the mid RCA. This vessel appears patent. The posterior lateral  artery is patent as well. There are left to right collaterals feeding the posterior lateral territory.Marland Kitchen  LEFT VENTRICULOGRAM: Left ventricular angiogram was done in the 30 RAO projection and revealed normal left ventricular wall motion and systolic function with an estimated ejection fraction of 60%. LVEDP was 18 mmHg.  IMPRESSIONS:  1. Severe three-vessel coronary artery disease as noted above. 2. Normal left ventricular systolic function. LVEDP 18 mmHg. Ejection fraction 60%.  Emergency coronary artery bypass grafting for critical anatomy. Coronary artery bypass grafting x4 with left internal mammary to the left anterior descending coronary artery, sequential reverse saphenous vein graft to the intermediate coronary artery and circumflex coronary artery, reverse saphenous vein graft to the distal right coronary artery with right thigh and calf greater saphenous vein harvesting endoscopically by Dr. Servando Snare on 03/21/2015.  History of Presenting Illness: This is a 74 year old Caucasian male with a history of CAD (s/p atherectomy in 1991), hypertension and hyperlipidemia who presented to [facility] for an elective cardiac catheterization by Dr. Irish Lack. According to the patient, he has had episodes of shortness of breath with exertion and right forearm pain. He denies chest pain, nausea, diaphoresis, or syncope. Elective cath done on 04/08 for intermediate risk exercise test. Results showed a 95% left main and 95% proximal RCA. Dr. Servando Snare discussed the need for emergent coronary artery bypass grafting surgery. Potential risks, benefits, and complications were discussed with the patient and he agreed to proceed with surgery. He underwent an emergent CABGx4 on 03/18/2015.  Brief Hospital Course:  The patient was  extubated late the evening of surgery without difficulty. He remained afebrile and hemodynamically stable. He was weaned off of Neo synephrine drip. Gordy Councilman, a line,  chest tubes, and foley were removed early in the post operative course. Coreg was later started and titrated accordingly. He was volume over loaded and diuresed. He had ABL anemia. His last H and H was 8.8 and 25.2. He was started on Ferrous sulfate and folic acid. He did not require a post op transfusion. He has mild thrombocytopenia as well. His last platelet count was 130,000. He was weaned off the insulin drip. The patient's glucose remained well controlled. He did not have an HGA1C drawn as he was an emergency. Because glucose was generally under 120, accu checks and SS PRN were stopped. The patient was felt surgically stable for transfer from the ICU to [facility]U for further convalescence on 03/22/2015. He continues to progress with cardiac rehab. Hewas ambulating on room air. He has been tolerating a diet and has had a bowel movement. Epicardial pacing wires and chest tube sutures will be removed prior to discharge. The patient is felt surgically stable for discharge today.   Latest Vital Signs: Blood pressure 137/71, pulse 61, temperature 98 F (36.7 C), temperature source Oral, resp. rate 20, height 6' (1.829 m), weight 172 lb 9.6 oz (78.291 kg), SpO2 99 %.  Physical Exam: Cardiovascular: RRR Pulmonary: Clear to auscultation on right and diminished left base; no rales, wheezes, or rhonchi. Abdomen: Soft, non tender, bowel sounds present. Extremities: Mild bilateral lower extremity edema. Ecchymosis right thigh. Wounds: Clean and dry. No erythema or signs of infection.  Discharge Condition:Stable and discharged to home  Recent laboratory studies:  Lab Results  Component Value Date   WBC 5.2 03/22/2015   HGB 8.8* 03/22/2015   HCT 25.2* 03/22/2015   MCV 93.0 03/22/2015   PLT 130* 03/22/2015   Lab Results  Component Value Date   NA 137 03/22/2015   K 4.0 03/22/2015   CL 105 03/22/2015   CO2 27 03/22/2015   CREATININE 1.16 03/22/2015   GLUCOSE 92 03/22/2015      Diagnostic  Studies: Dg Chest 2 View  03/22/2015   CLINICAL DATA:  Shortness of breath, status post coronary artery bypass graft x4.  EXAM: CHEST  2 VIEW  COMPARISON:  March 20, 2015.  FINDINGS: Stable cardiomediastinal silhouette. Status post coronary artery bypass graft. No pneumothorax is noted. Minimal right perihilar subsegmental atelectasis noted. Stable left basilar opacity is noted most consistent with subsegmental atelectasis with associated pleural effusion.  IMPRESSION: Stable left basilar opacity consistent with subsegmental atelectasis with associated pleural effusion.   Electronically Signed   By: Marijo Conception, M.D.   On: 03/22/2015 08:02    Discharge Medications:   Medication List    STOP taking these medications        lisinopril-hydrochlorothiazide 20-12.5 MG per tablet  Commonly known as:  PRINZIDE,ZESTORETIC     nitroGLYCERIN 0.4 MG SL tablet  Commonly known as:  NITROSTAT     vitamin E 100 UNIT capsule      TAKE these medications        amiodarone 200 MG tablet  Commonly known as:  PACERONE  Take 1 tablet (200 mg total) by mouth 2 (two) times daily. For 2 weeks;then take Amiodarone 200 mg by mouth daily thereafter.     aspirin 325 MG EC tablet  Take 1 tablet (325 mg total) by mouth daily.     atorvastatin 80 MG  tablet  Commonly known as:  LIPITOR  Take 1 tablet (80 mg total) by mouth daily.     Biotin 5000 MCG Caps  Take 1 capsule by mouth daily.     CALCIUM 600+D PLUS MINERALS 600-400 MG-UNIT Tabs  Take 1 tablet by mouth daily.     carvedilol 3.125 MG tablet  Commonly known as:  COREG  Take 1 tablet (3.125 mg total) by mouth 2 (two) times daily with a meal.     CENTRUM SILVER PO  Take 1 tablet by mouth daily.     cholecalciferol 1000 UNITS tablet  Commonly known as:  VITAMIN D  Take 1,000 Units by mouth daily.     ferrous sulfate 325 (65 FE) MG tablet  Take 1 tablet (325 mg total) by mouth daily with breakfast. For one month then stop.  Start taking on:   03/24/2015     fish oil-omega-3 fatty acids 1000 MG capsule  Take 1 g by mouth daily.     folic acid 1 MG tablet  Commonly known as:  FOLVITE  Take 1 tablet (1 mg total) by mouth daily. For one month then stop.     furosemide 40 MG tablet  Commonly known as:  LASIX  Take 1 tablet (40 mg total) by mouth daily. For 5 days then stop.     Glucosamine 500 MG Caps  Take 1 capsule by mouth daily.     lisinopril 5 MG tablet  Commonly known as:  PRINIVIL,ZESTRIL  Take 1 tablet (5 mg total) by mouth daily.     NON FORMULARY  raisen soaked in gin     oxyCODONE 5 MG immediate release tablet  Commonly known as:  Oxy IR/ROXICODONE  Take 1-2 tablets (5-10 mg total) by mouth every 4 (four) hours as needed for severe pain.     potassium chloride SA 20 MEQ tablet  Commonly known as:  K-DUR,KLOR-CON  Take 1 tablet (20 mEq total) by mouth daily. For 5 days then stop.     PROBIOTIC DAILY PO  Take 1 tablet by mouth daily.     saw palmetto 160 MG capsule  Take 160 mg by mouth daily.     ULORIC 40 MG tablet  Generic drug:  febuxostat  Take 1 tablet by mouth  daily     ZINC 15 PO  Take 1 tablet by mouth daily.       The patient has been discharged on:   1.Beta Blocker:  Yes [ x  ]                              No   [   ]                              If No, reason:  2.Ace Inhibitor/ARB: Yes [ x  ]                                     No  [    ]                                     If No, reason:  3.Statin:   Yes [  x ]  No  [   ]                  If No, reason:  4.Ecasa:  Yes  [ x  ]                  No   [   ]                  If No, reason:  Follow Up Appointments: Follow-up Information    Follow up with Richardson Dopp, PA-C On 04/05/2015.   Specialty:  Physician Assistant   Why:  Appointment time is at 8:30 am   Contact information:   [address] [place] [address] [phone]       Follow up with Grace Isaac, MD.    Specialty:  Cardiothoracic Surgery   Why:  PA/LAT CXR to be taken (at [facility] which is in the same building as Dr. Everrett Coombe office) one hour prior to office appointment;Office will contact you with appointment date and time   Contact information:   [address] [place] [address] [phone]       Signed: Lars Pinks MPA-C 03/23/2015, 11:08 AM

## 2015-03-23 NOTE — Care Management Note (Signed)
    Page 1 of 1   03/23/2015     2:54:25 PM CARE MANAGEMENT NOTE 03/23/2015  Patient:  Bradley Terrell, Bradley Terrell   Account Number:  [id number]  Date Initiated:  03/22/2015  Documentation initiated by:  Marvetta Gibbons  Subjective/Objective Assessment:   Pt admitted s/p emergent CABG x4     Action/Plan:   PTA pt lived at home with spouse   Anticipated DC Date:  03/25/2015   Anticipated DC Plan:  HOME/SELF CARE         Choice offered to / List presented to:             Status of service:  Completed, signed off Medicare Important Message given?  YES (If response is "NO", the following Medicare IM given date fields will be blank) Date Medicare IM given:  03/23/2015 Medicare IM given by:  Marvetta Gibbons Date Additional Medicare IM given:   Additional Medicare IM given by:    Discharge Disposition:  HOME/SELF CARE  Per UR Regulation:  Reviewed for med. necessity/level of care/duration of stay  If discussed at [place] of Stay Meetings, dates discussed:    Comments:  03/22/15- 4- Marvetta Gibbons RN, BSN [phone] Pt tx from 2S today, NCM to follow for d/c needs

## 2015-03-23 NOTE — Progress Notes (Signed)
CARDIAC REHAB PHASE I   PRE:  Rate/Rhythm: 77 SR       MODE:  Ambulation: 1090 ft   POST:  Rate/Rhythm: 91 SR  BP:  Supine:   Sitting: 140/68  Standing:    SaO2: 98-99%RA 0910-1007 Pt walked 1090 ft with steady gait. Tolerated well. Education completed with pt who voiced understanding. Did not want to watch discharge video as he stated had been through this before in [place]. Discussed CRP 2 and pt agreed to referral to [facility].    Graylon Good, RN BSN  03/23/2015 10:04 AM

## 2015-03-23 NOTE — Telephone Encounter (Signed)
-----   Message from Candis Schatz sent at 03/23/2015  9:18 AM EDT ----- Regarding: TCM 14 days  Please call the pt. Thx   ----- Message -----    From: Candis Schatz    Sent: 03/23/2015   8:58 AM      To: Nani Skillern, PA-C Subject: RE: Follow Up Appointment                      Richardson Dopp, PA-C 4/26 @8 :30 am. [facility])  ----- Message -----    From: Nani Skillern, PA-C    Sent: 03/23/2015   8:12 AM      To: Candis Schatz Subject: Follow Up Appointment                          Hi Trish! MRN # [id number] is s./p emergent CABG x 4 on 03/18/2015. He is a patient of [facility]'s and needs a follow up appointment. He may be going home today. Thanks.

## 2015-03-23 NOTE — Discharge Instructions (Signed)
Activity: 1.May walk up steps                2.No lifting more than ten pounds for four weeks.                 3.No driving for four weeks.                4.Stop any activity that causes chest pain, shortness of breath, dizziness, sweating or excessive weakness.                5.Avoid straining.                6.Continue with your breathing exercises daily.  Diet: Low fat, Low salt diet  Wound Care: May shower.  Clean wounds with mild soap and water daily. Contact the office at [phone] if any problems arise.   Coronary Artery Bypass Grafting, Care After Refer to this sheet in the next few weeks. These instructions provide you with information on caring for yourself after your procedure. Your health care provider may also give you more specific instructions. Your treatment has been planned according to current medical practices, but problems sometimes occur. Call your health care provider if you have any problems or questions after your procedure. WHAT TO EXPECT AFTER THE PROCEDURE Recovery from surgery will be different for everyone. Some people feel well after 3 or 4 weeks, while for others it takes longer. After your procedure, it is typical to have the following:  Nausea and a lack of appetite.   Constipation.  Weakness and fatigue.   Depression or irritability.   Pain or discomfort at your incision site. HOME CARE INSTRUCTIONS  Take medicines only as directed by your health care provider. Do not stop taking medicines or start any new medicines without first checking with your health care provider.  Take your pulse as directed by your health care provider.  Perform deep breathing as directed by your health care provider. If you were given a device called an incentive spirometer, use it to practice deep breathing several times a day. Support your chest with a pillow or your arms when you take deep breaths or cough.  Keep incision areas clean, dry, and protected. Remove or  change any bandages (dressings) only as directed by your health care provider. You may have skin adhesive strips over the incision areas. Do not take the strips off. They will fall off on their own.  Check incision areas daily for any swelling, redness, or drainage.  If incisions were made in your legs, do the following:  Avoid crossing your legs.   Avoid sitting for long periods of time. Change positions every 30 minutes.   Elevate your legs when you are sitting.  Wear compression stockings as directed by your health care provider. These stockings help keep blood clots from forming in your legs.  Take showers once your health care provider approves. Until then, only take sponge baths. Pat incisions dry. Do not rub incisions with a washcloth or towel. Do not take baths, swim, or use a hot tub until your health care provider approves.  Eat foods that are high in fiber, such as raw fruits and vegetables, whole grains, beans, and nuts. Meats should be lean cut. Avoid canned, processed, and fried foods.  Drink enough fluid to keep your urine clear or pale yellow.  Weigh yourself every day. This helps identify if you are retaining fluid that may make your heart and lungs work harder.  Rest and limit activity as directed by your health care provider. You may be instructed to:  Stop any activity at once if you have chest pain, shortness of breath, irregular heartbeats, or dizziness. Get help right away if you have any of these symptoms.  Move around frequently for short periods or take short walks as directed by your health care provider. Increase your activities gradually. You may need physical therapy or cardiac rehabilitation to help strengthen your muscles and build your endurance.  Avoid lifting, pushing, or pulling anything heavier than 10 lb (4.5 kg) for at least 6 weeks after surgery.  Do not drive until your health care provider approves.  Ask your health care provider when you  may return to work.  Ask your health care provider when you may resume sexual activity.  Keep all follow-up visits as directed by your health care provider. This is important. SEEK MEDICAL CARE IF:  You have swelling, redness, increasing pain, or drainage at the site of an incision.  You have a fever.  You have swelling in your ankles or legs.  You have pain in your legs.   You gain 2 or more pounds (0.9 kg) a day.  You are nauseous or vomit.  You have diarrhea. SEEK IMMEDIATE MEDICAL CARE IF:  You have chest pain that goes to your jaw or arms.  You have shortness of breath.   You have a fast or irregular heartbeat.   You notice a "clicking" in your breastbone (sternum) when you move.   You have numbness or weakness in your arms or legs.  You feel dizzy or light-headed.  MAKE SURE YOU:  Understand these instructions.  Will watch your condition.  Will get help right away if you are not doing well or get worse. Document Released: 06/15/2005 Document Revised: 04/12/2014 Document Reviewed: 05/05/2013 [facility] Patient Information 2015 [place], [place]. This information is not intended to replace advice given to you by your health care provider. Make sure you discuss any questions you have with your health care provider.

## 2015-03-23 NOTE — Progress Notes (Addendum)
      [address]       [place] [address]             [phone]        5 Days Post-Op Procedure(s) (LRB): CORONARY ARTERY BYPASS GRAFTING (CABG) x  four, LIMA to LAD, SVG-Ramus-dCx, SVG - dRCA using left internal mammary artery and right leg thigh & calf greater saphenous vein harvested endoscopically (N/A) INTRAOPERATIVE TRANSESOPHAGEAL ECHOCARDIOGRAM (N/A) ENDOSCPOIC HARVEST OF RIGHT GREATER SAPHENOUS VEIN (Right)  Subjective: Patient eating breakfast. Has no complaints. Hopes to go home.  Objective: Vital signs in last 24 hours: Temp:  [97.4 F (36.3 C)-99 F (37.2 C)] 98 F (36.7 C) (04/13 0514) Pulse Rate:  [70-96] 70 (04/13 0514) Cardiac Rhythm:  [-] Normal sinus rhythm (04/12 2000) Resp:  [19-31] 20 (04/13 0514) BP: (127-155)/(62-96) 155/67 mmHg (04/13 0514) SpO2:  [91 %-100 %] 99 % (04/13 0514) Weight:  [172 lb 9.6 oz (78.291 kg)] 172 lb 9.6 oz (78.291 kg) (04/13 0514)  Pre op weight 74 kg Current Weight  03/23/15 172 lb 9.6 oz (78.291 kg)      Intake/Output from previous day: 04/12 0701 - 04/13 0700 In: 580.2 [P.O.:480; I.V.:100.2] Out: 1850 [Urine:1850]   Physical Exam:  Cardiovascular: RRR Pulmonary: Clear to auscultation on right and diminished left base; no rales, wheezes, or rhonchi. Abdomen: Soft, non tender, bowel sounds present. Extremities: Mild bilateral lower extremity edema. Ecchymosis right thigh. Wounds: Clean and dry.  No erythema or signs of infection.  Lab Results: CBC: Recent Labs  03/22/15 0333  WBC 5.2  HGB 8.8*  HCT 25.2*  PLT 130*   BMET:  Recent Labs  03/21/15 0354 03/22/15 0333  NA 135 137  K 3.7 4.0  CL 103 105  CO2 28 27  GLUCOSE 102* 92  BUN 11 12  CREATININE 1.00 1.16  CALCIUM 7.4* 7.6*    PT/INR:  Lab Results  Component Value Date   INR 1.66* 03/18/2015   INR 1.0 03/16/2015   ABG:  INR: Will add last result for INR, ABG once components are confirmed Will add last 4 CBG  results once components are confirmed  Assessment/Plan:  1. CV - Previous a fib. Maintaining SR in the 70's this am. On Amiodarone 400 mg bid, Coreg 3.125 mg bid, Lisinopril 2.5 mg daily. Will increase Lisinopril to 5 daily for better BP control. Will decrease Amiodarone to 200 mg bid at discharge. 2.  Pulmonary - On room air. Encourage incentive spirometer. 3. Volume Overload - On Lasix 40 mg daily 4.  Acute blood loss anemia - H and H 8.8 and 25.2. Start folic acid and iron. 5. Mild thrombocytopenia-platelets up to 130,000 6. Remove EPW 7. CBGs 137/128/104. No pre op HGA1C as was an emergency. No prior history of diabetes. Stop accu checks and SS PRN. 8. Will discharge later today if maintains sinus rhythm  ZIMMERMAN,DONIELLE MPA-C 03/23/2015,7:50 AM

## 2015-03-23 NOTE — Progress Notes (Signed)
PT WATCHED DISCHARGE VIDEO, DISCHARGE INSTRUCTIONS REVIEWED WITH PT. PRESCRIPTIONS GIVEN AND APPOINTMENTS GIVEN. PT VU. DISCHARGE VS OBTAINED WNL.

## 2015-03-24 NOTE — Telephone Encounter (Signed)
Patient contacted regarding discharge from [facility] on 03/23/15.  Patient understands to follow up with Richardson Dopp, PA on 04/05/15 at 8:30 a.m. at [facility] office.  Patient understands discharge instructions?  Yes  Patient understands medications and regiment?  Yes  Patient understands to bring all medications to this visit?  Yes  Other details: Patient's wife (whom I ended up speaking with after he handed her the phone) tells me they have a rx for Lisinopril 5mg  daily. After reviewing d/c medications this was not on list to start.  Advised to call [facility] to confirm whether or not pt should be taking Lisinopril. She stated she will call them.

## 2015-04-04 NOTE — Progress Notes (Signed)
Cardiology Office Note   Date:  04/05/2015   ID:  Bradley Terrell, DOB 01-11-1941, MRN [id number]  PCP:  Vikki Ports, MD  Cardiologist:  Dr. Casandra Doffing     Chief Complaint  Patient presents with  . Coronary Artery Disease    s/p CABG     History of Present Illness: Bradley Terrell is a 73 y.o. male with a hx of CAD s/p prior intervention in [place] in 1991, HTN, HL.  Recently seen by Dr. Casandra Doffing for increasing dyspnea.  Myoview was high risk and LHC was arranged.  This demonstrated critical LM stenosis with 3 v CAD.  EF was 60%. He was seen by Dr. Servando Snare and underwent emergent CABG (L-LAD, S-RI/CFX, S-distRCA).  Post op course notable for AFib. He converted to NSR with Amiodarone.  He returns for FU.  He is here today with his wife. He has been walking twice a day. He denies significant shortness of breath. He denies significant chest soreness since his surgery. He denies orthopnea or PND. Edema from right greater saphenous vein harvesting continues but is overall improved. He denies syncope.   Studies/Reports Reviewed Today:  Myoview 03/16/15 High risk stress nuclear study with a medium sized, moderate intensity partially reversible defect in the inferior wall and apex.  Small in size, medium in intensity fixed defect in the basal and mid inferolateral wall.  Markedly positive EKG for ischemia with typical chest pain and SOB.  LV Ejection Fraction: 51%.  LV Wall Motion:  Low NL LV Function; NL Wall Motion  LHC 03/18/15 LM:  95% distal stenosis.  LAD:  severe ostial disease of the LAD from the plaque which extends from the left main.  LCx:  ostial stenosis which is severe from the left main plaque extending into the circumflex. RI with severe ostial stenosis which is patent. L>R collats from CFX  RCA:   subtotal occlusion proximally, 99%.  EF 60%.  LVEDP was 18 mmHg.    Past Medical History  Diagnosis Date  . CAD (coronary artery disease)     a. PCI in 1991 (in [place]); b.  LHC 4/16: dLM 95%, severe ostial LAD and ostial CFX disease extending from the LM, severe oRI, pRCA 99%, EF 60% >> CABG  . HTN (hypertension)   . Renal cyst     left  . Gilbert syndrome     elevated indirect bilirubin  . Inguinal hernia     R>L, easily reducible  . HLD (hyperlipidemia)   . PAF (paroxysmal atrial fibrillation)     post CABG >> converted to NSR with Amiodarone    Past Surgical History  Procedure Laterality Date  . Tonsillectomy  age 40  . Cardiac atherectomy  '91    to LAD and RCA  . Prk  '96    (refractive eye surgery)  . Colonoscopy  03/2009, 05/2014  . Left heart catheterization with coronary angiogram N/A 03/18/2015    Procedure: LEFT HEART CATHETERIZATION WITH CORONARY ANGIOGRAM;  Surgeon: Jettie Booze, MD;  Location: [facility] CATH LAB;  Service: Cardiovascular;  Laterality: N/A;  . Coronary artery bypass graft N/A 03/18/2015    Procedure: CORONARY ARTERY BYPASS GRAFTING (CABG) x  four, LIMA to LAD, SVG-Ramus-dCx, SVG - dRCA using left internal mammary artery and right leg thigh & calf greater saphenous vein harvested endoscopically;  Surgeon: Grace Isaac, MD;  Location: [facility];  Service: Open Heart Surgery;  Laterality: N/A;  . Intraoperative transesophageal echocardiogram N/A 03/18/2015  Procedure: INTRAOPERATIVE TRANSESOPHAGEAL ECHOCARDIOGRAM;  Surgeon: Grace Isaac, MD;  Location: [facility];  Service: Open Heart Surgery;  Laterality: N/A;  . Endovein harvest of greater saphenous vein Right 03/18/2015    Procedure: ENDOSCPOIC HARVEST OF RIGHT GREATER SAPHENOUS VEIN;  Surgeon: Grace Isaac, MD;  Location: [facility];  Service: Open Heart Surgery;  Laterality: Right;     Current Outpatient Prescriptions  Medication Sig Dispense Refill  . amiodarone (PACERONE) 200 MG tablet Take 1 tablet (200 mg total) by mouth 2 (two) times daily. For 2 weeks;then take Amiodarone 200 mg by mouth daily thereafter. 30 tablet 1  . aspirin EC 325 MG EC tablet Take 1 tablet (325 mg  total) by mouth daily. 30 tablet 0  . atorvastatin (LIPITOR) 80 MG tablet Take 1 tablet (80 mg total) by mouth daily. 90 tablet 3  . Biotin 5000 MCG CAPS Take 1 capsule by mouth daily.    . Calcium Carbonate-Vit D-Min (CALCIUM 600+D PLUS MINERALS) 600-400 MG-UNIT TABS Take 1 tablet by mouth daily.      . carvedilol (COREG) 3.125 MG tablet Take 1 tablet (3.125 mg total) by mouth 2 (two) times daily with a meal. 60 tablet 3  . cholecalciferol (VITAMIN D) 1000 UNITS tablet Take 1,000 Units by mouth daily.    . ferrous sulfate 325 (65 FE) MG tablet Take 1 tablet (325 mg total) by mouth daily with breakfast. For one month then stop.  3  . fish oil-omega-3 fatty acids 1000 MG capsule Take 1 g by mouth daily.      . folic acid (FOLVITE) 1 MG tablet Take 1 tablet (1 mg total) by mouth daily. For one month then stop.    . furosemide (LASIX) 40 MG tablet Take 1 tablet (40 mg total) by mouth daily. For 5 days then stop. 5 tablet 0  . Glucosamine 500 MG CAPS Take 1 capsule by mouth daily.     Marland Kitchen lisinopril (PRINIVIL,ZESTRIL) 5 MG tablet Take 1 tablet (5 mg total) by mouth daily. 30 tablet 1  . Multiple Vitamins-Minerals (CENTRUM SILVER PO) Take 1 tablet by mouth daily.      . NON FORMULARY raisen soaked in gin    . oxyCODONE (OXY IR/ROXICODONE) 5 MG immediate release tablet Take 1-2 tablets (5-10 mg total) by mouth every 4 (four) hours as needed for severe pain. 30 tablet 0  . potassium chloride SA (K-DUR,KLOR-CON) 20 MEQ tablet Take 1 tablet (20 mEq total) by mouth daily. For 5 days then stop. 5 tablet 0  . Probiotic Product (PROBIOTIC DAILY PO) Take 1 tablet by mouth daily.     . saw palmetto 160 MG capsule Take 160 mg by mouth daily.     Marland Kitchen ULORIC 40 MG tablet Take 1 tablet by mouth  daily 90 tablet 0  . Zinc Sulfate (ZINC 15 PO) Take 1 tablet by mouth daily.       No current facility-administered medications for this visit.    Allergies:   Review of patient's allergies indicates no known allergies.     Social History:  The patient  reports that he has never smoked. He has never used smokeless tobacco. He reports that he drinks alcohol. He reports that he does not use illicit drugs.   Family History:  The patient's family history includes Cancer in his brother, mother, other, and sister; Cancer (age of onset: 82) in his son; Cancer (age of onset: 12) in his brother; Colon cancer in his brother and son;  Heart attack in his father and paternal grandfather; Heart disease in his father; Heart disease (age of onset: 54) in his brother; Hypertension in his father and sister; Parkinson's disease in his sister. There is no history of Diabetes.    ROS:   Please see the history of present illness.   Review of Systems  Gastrointestinal:       Constipation Dark stools  All other systems reviewed and are negative.    PHYSICAL EXAM: VS:  BP 120/62 mmHg  Pulse 65  Ht 6' (1.829 m)  Wt 170 lb (77.111 kg)  BMI 23.05 kg/m2    Wt Readings from Last 3 Encounters:  04/05/15 170 lb (77.111 kg)  03/23/15 172 lb 9.6 oz (78.291 kg)  03/15/15 168 lb (76.204 kg)     GEN: Well nourished, well developed, in no acute distress HEENT: normal Neck: no JVD,  no masses Cardiac:  Normal S1/S2, RRR; no murmur ,  no rubs or gallops, 1+ RLE edema, trace LLE edema Chest: Median sternotomy well healed without erythema or discharge Respiratory:  clear to auscultation bilaterally, no wheezing, rhonchi or rales. GI: soft, nontender, nondistended, + BS MS: no deformity or atrophy Skin: warm and dry  Neuro:  CNs II-XII intact, Strength and sensation are intact Psych: Normal affect   EKG:  EKG is ordered today.  It demonstrates:   NSR, HR 65, normal axis, low voltage, nonspecific ST-T wave changes, no significant change when compared to prior tracings   Recent Labs: 04/15/2014: TSH 1.913 11/11/2014: ALT 21 03/19/2015: Magnesium 2.3 03/22/2015: BUN 12; Creatinine 1.16; Potassium 4.0; Sodium 137 04/05/2015: Hemoglobin  11.2*; Platelets 410.0*    Lipid Panel    Component Value Date/Time   CHOL 145 11/11/2014 0822   TRIG 104 11/11/2014 0822   HDL 62 11/11/2014 0822   CHOLHDL 2.3 11/11/2014 0822   VLDL 21 11/11/2014 0822   LDLCALC 62 11/11/2014 0822      ASSESSMENT AND PLAN:  Coronary artery disease S/P CABG x 4 He is progressing well since his recent bypass surgery. He is not interested in formal cardiac rehabilitation. I encouraged him to call us if he changes his mind. He will continue on his current medical regimen which includes aspirin, statin, beta blocker, ACE inhibitor.  Paroxysmal atrial fibrillation He is remaining in sinus rhythm. Continue amiodarone for now. This will likely be discontinued at follow-up with Dr. Irish Lack.  Essential hypertension, benign Controlled.  Pure hypercholesterolemia Continue statin.  Anemia, unspecified anemia type  Patient has noted significantly dark stools. This may be related to iron therapy in the setting of significant constipation. Obtain CBC today. Obtain stool cards 3. He may need earlier follow-up with primary care depending upon the results of the above.   Current medicines are reviewed at length with the patient today.  Concerns regarding medicines are as outlined above.  The following changes have been made:    None   Labs/ tests ordered today include:  Orders Placed This Encounter  Procedures  . CBC with Differential  . EKG 12-Lead    Disposition:   FU with Dr. Irish Lack 6-8 weeks   Signed, Bradley Dopp, PA-C, MHS 04/05/2015 3:24 PM    [facility] [place], [place], [place]  [address] Phone: [phone]; Fax: [phone]

## 2015-04-05 ENCOUNTER — Ambulatory Visit (INDEPENDENT_AMBULATORY_CARE_PROVIDER_SITE_OTHER): Payer: Medicare Other | Admitting: Physician Assistant

## 2015-04-05 ENCOUNTER — Encounter: Payer: Self-pay | Admitting: Physician Assistant

## 2015-04-05 VITALS — BP 120/62 | HR 65 | Ht 72.0 in | Wt 170.0 lb

## 2015-04-05 DIAGNOSIS — I480 Paroxysmal atrial fibrillation: Secondary | ICD-10-CM

## 2015-04-05 DIAGNOSIS — Z951 Presence of aortocoronary bypass graft: Secondary | ICD-10-CM

## 2015-04-05 DIAGNOSIS — D649 Anemia, unspecified: Secondary | ICD-10-CM

## 2015-04-05 DIAGNOSIS — E7800 Pure hypercholesterolemia, unspecified: Secondary | ICD-10-CM

## 2015-04-05 DIAGNOSIS — I2510 Atherosclerotic heart disease of native coronary artery without angina pectoris: Secondary | ICD-10-CM

## 2015-04-05 DIAGNOSIS — I10 Essential (primary) hypertension: Secondary | ICD-10-CM

## 2015-04-05 LAB — CBC WITH DIFFERENTIAL/PLATELET
Basophils Relative: 0 % (ref 0.0–3.0)
Eosinophils Relative: 2 % (ref 0.0–5.0)
HEMATOCRIT: 33 % — AB (ref 39.0–52.0)
HEMOGLOBIN: 11.2 g/dL — AB (ref 13.0–17.0)
LYMPHS PCT: 22 % (ref 12.0–46.0)
MCHC: 34 g/dL (ref 30.0–36.0)
MCV: 94.1 fl (ref 78.0–100.0)
Monocytes Relative: 16 % — ABNORMAL HIGH (ref 3.0–12.0)
NEUTROS PCT: 60 % (ref 43.0–77.0)
Platelets: 410 10*3/uL — ABNORMAL HIGH (ref 150.0–400.0)
RBC: 3.51 Mil/uL — AB (ref 4.22–5.81)
RDW: 14.3 % (ref 11.5–15.5)
WBC: 5 10*3/uL (ref 4.0–10.5)

## 2015-04-05 NOTE — Patient Instructions (Signed)
Medication Instructions:  Your physician recommends that you continue on your current medications as directed. Please refer to the Current Medication list given to you today.  Labwork: Your physician recommends that you have labs today. CBC  Testing/Procedures: Patient needs stool cards (3)  Follow-Up: Your physician recommends that you schedule a follow-up appointment in: 6 to 8 weeks with Dr. Irish Lack   Any Other Special Instructions Will Be Listed Below (If Applicable).

## 2015-04-22 ENCOUNTER — Other Ambulatory Visit (INDEPENDENT_AMBULATORY_CARE_PROVIDER_SITE_OTHER): Payer: Medicare Other | Admitting: *Deleted

## 2015-04-22 DIAGNOSIS — E7800 Pure hypercholesterolemia, unspecified: Secondary | ICD-10-CM

## 2015-04-22 LAB — HEPATIC FUNCTION PANEL
ALK PHOS: 85 U/L (ref 39–117)
ALT: 12 U/L (ref 0–53)
AST: 17 U/L (ref 0–37)
Albumin: 3.6 g/dL (ref 3.5–5.2)
Bilirubin, Direct: 0.2 mg/dL (ref 0.0–0.3)
Total Bilirubin: 0.9 mg/dL (ref 0.2–1.2)
Total Protein: 6.4 g/dL (ref 6.0–8.3)

## 2015-04-22 LAB — LIPID PANEL
CHOL/HDL RATIO: 3
Cholesterol: 122 mg/dL (ref 0–200)
HDL: 44.9 mg/dL (ref >39.00)
LDL CALC: 59 mg/dL (ref 0–99)
NonHDL: 77.1
Triglycerides: 91 mg/dL (ref 0.0–149.0)
VLDL: 18.2 mg/dL (ref 0.0–40.0)

## 2015-04-27 ENCOUNTER — Other Ambulatory Visit: Payer: Self-pay | Admitting: Cardiothoracic Surgery

## 2015-04-27 DIAGNOSIS — Z951 Presence of aortocoronary bypass graft: Secondary | ICD-10-CM

## 2015-04-28 ENCOUNTER — Ambulatory Visit
Admission: RE | Admit: 2015-04-28 | Discharge: 2015-04-28 | Disposition: A | Discharge status: Home / Self Care | Payer: Medicare Other | Source: Ambulatory Visit | Attending: Cardiothoracic Surgery | Admitting: Cardiothoracic Surgery

## 2015-04-28 ENCOUNTER — Ambulatory Visit (HOSPITAL_COMMUNITY)
Admission: RE | Admit: 2015-04-28 | Discharge: 2015-04-28 | Disposition: A | Discharge status: Home / Self Care | Payer: Medicare Other | Source: Ambulatory Visit | Attending: Cardiothoracic Surgery | Admitting: Cardiothoracic Surgery

## 2015-04-28 ENCOUNTER — Encounter: Payer: Self-pay | Admitting: Cardiothoracic Surgery

## 2015-04-28 ENCOUNTER — Other Ambulatory Visit: Payer: Self-pay | Admitting: *Deleted

## 2015-04-28 ENCOUNTER — Ambulatory Visit (INDEPENDENT_AMBULATORY_CARE_PROVIDER_SITE_OTHER): Payer: Self-pay | Admitting: Cardiothoracic Surgery

## 2015-04-28 VITALS — BP 155/85 | HR 65 | Resp 16 | Ht 72.0 in | Wt 163.0 lb

## 2015-04-28 DIAGNOSIS — Z951 Presence of aortocoronary bypass graft: Secondary | ICD-10-CM | POA: Insufficient documentation

## 2015-04-28 DIAGNOSIS — I2510 Atherosclerotic heart disease of native coronary artery without angina pectoris: Secondary | ICD-10-CM

## 2015-04-28 DIAGNOSIS — R609 Edema, unspecified: Secondary | ICD-10-CM

## 2015-04-28 DIAGNOSIS — M7989 Other specified soft tissue disorders: Secondary | ICD-10-CM | POA: Insufficient documentation

## 2015-04-28 DIAGNOSIS — J90 Pleural effusion, not elsewhere classified: Secondary | ICD-10-CM | POA: Diagnosis not present

## 2015-04-28 NOTE — Progress Notes (Signed)
[address]       [place] [address]             [phone]      Debbie A Srinivasan [place] Medical Record #[id number] Date of Birth: 1941-01-23  Referring: Jettie Booze, MD Primary Care: Vikki Ports, MD  Chief Complaint:   POST OP FOLLOW UP 03/18/2015 OPERATIVE REPORT PREOPERATIVE DIAGNOSIS: Critical left main and ostial right disease with positive stress test. POSTOPERATIVE DIAGNOSIS: Critical left main and ostial right disease with positive stress test. SURGICAL PROCEDURE: Emergency coronary artery bypass grafting for critical anatomy. Coronary artery bypass grafting x4 with left internal mammary to the left anterior descending coronary artery, sequential reverse saphenous vein graft to the intermediate coronary artery and circumflex coronary artery, reverse saphenous vein graft to the distal right coronary artery with right thigh and calf greater saphenous vein harvesting endoscopically. SURGEON: Lanelle Bal, MD  History of Present Illness:     Patient has returned to near normal activities following emergency bypass surgery April 8. He declined any interest in going to cardiac rehabilitation. He denies any recurrent angina. Notes that he's able to do his usual amount of activity without the shortness of breath that he was previously experiencing.      Past Medical History  Diagnosis Date  . CAD (coronary artery disease)     a. PCI in 1991 (in [place]); b. LHC 4/16: dLM 95%, severe ostial LAD and ostial CFX disease extending from the LM, severe oRI, pRCA 99%, EF 60% >> CABG  . HTN (hypertension)   . Renal cyst     left  . Gilbert syndrome     elevated indirect bilirubin  . Inguinal hernia     R>L, easily reducible  . HLD (hyperlipidemia)   . PAF (paroxysmal atrial fibrillation)     post CABG >> converted to NSR with Amiodarone     History  Smoking status  . Never Smoker   Smokeless tobacco  . Never Used    History    Alcohol Use  . 0.0 oz/week  . 0 Standard drinks or equivalent per week    Comment: bourbon and ginger daily, wine with dinner, stinger in the evening.     No Known Allergies  Current Outpatient Prescriptions  Medication Sig Dispense Refill  . amiodarone (PACERONE) 200 MG tablet Take 1 tablet (200 mg total) by mouth 2 (two) times daily. For 2 weeks;then take Amiodarone 200 mg by mouth daily thereafter. 30 tablet 1  . aspirin EC 325 MG EC tablet Take 1 tablet (325 mg total) by mouth daily. 30 tablet 0  . atorvastatin (LIPITOR) 80 MG tablet Take 1 tablet (80 mg total) by mouth daily. 90 tablet 3  . Biotin 5000 MCG CAPS Take 1 capsule by mouth daily.    . Calcium Carbonate-Vit D-Min (CALCIUM 600+D PLUS MINERALS) 600-400 MG-UNIT TABS Take 1 tablet by mouth daily.      . carvedilol (COREG) 3.125 MG tablet Take 1 tablet (3.125 mg total) by mouth 2 (two) times daily with a meal. 60 tablet 3  . cholecalciferol (VITAMIN D) 1000 UNITS tablet Take 1,000 Units by mouth daily.    . ferrous sulfate 325 (65 FE) MG tablet Take 1 tablet (325 mg total) by mouth daily with breakfast. For one month then stop.  3  . fish oil-omega-3 fatty acids 1000 MG capsule Take 1 g by mouth daily.      . folic acid (FOLVITE)  1 MG tablet Take 1 tablet (1 mg total) by mouth daily. For one month then stop.    . furosemide (LASIX) 40 MG tablet Take 1 tablet (40 mg total) by mouth daily. For 5 days then stop. 5 tablet 0  . Glucosamine 500 MG CAPS Take 1 capsule by mouth daily.     Marland Kitchen lisinopril (PRINIVIL,ZESTRIL) 5 MG tablet Take 1 tablet (5 mg total) by mouth daily. 30 tablet 1  . Multiple Vitamins-Minerals (CENTRUM SILVER PO) Take 1 tablet by mouth daily.      . NON FORMULARY raisen soaked in gin    . oxyCODONE (OXY IR/ROXICODONE) 5 MG immediate release tablet Take 1-2 tablets (5-10 mg total) by mouth every 4 (four) hours as needed for severe pain. 30 tablet 0  . potassium chloride SA (K-DUR,KLOR-CON) 20 MEQ tablet Take 1  tablet (20 mEq total) by mouth daily. For 5 days then stop. 5 tablet 0  . Probiotic Product (PROBIOTIC DAILY PO) Take 1 tablet by mouth daily.     . saw palmetto 160 MG capsule Take 160 mg by mouth daily.     Marland Kitchen ULORIC 40 MG tablet Take 1 tablet by mouth  daily 90 tablet 0  . Zinc Sulfate (ZINC 15 PO) Take 1 tablet by mouth daily.       No current facility-administered medications for this visit.       Physical Exam: BP 155/85 mmHg  Pulse 65  Resp 16  Ht 6' (1.829 m)  Wt 163 lb (73.936 kg)  BMI 22.10 kg/m2  SpO2 98%  General appearance: alert, cooperative and appears stated age Neurologic: intact Heart: regular rate and rhythm, S1, S2 normal, no murmur, click, rub or gallop Lungs: clear to auscultation bilaterally Abdomen: soft, non-tender; bowel sounds normal; no masses,  no organomegaly Extremities:  Left extremities normal, atraumatic, no cyanosis or edema and Homans sign is negative, no sign of DVT,  Right leg below the knee 's were vein was harvested but is still slightly enlarged but not tender especially compared to the left Wound:  Sternum is stable and well healed   Diagnostic Studies & Laboratory data:     Recent Radiology Findings:   Dg Chest 2 View  04/28/2015   CLINICAL DATA:  Status post CABG on March 18, 2015, currently asymptomatic.  EXAM: CHEST  2 VIEW  COMPARISON:  PA and lateral chest of March 22, 2015  FINDINGS: The lungs are well-expanded. There has been further interval decrease in the small left pleural effusion. The pulmonary interstitium has also cleared. The heart is normal in size. The pulmonary vascularity is not engorged. The mediastinum is normal in width. There are 7 intact sternal wires. The thoracic vertebral bodies are preserved in height.  IMPRESSION: Further interval decrease in the left pleural effusion. The right pleural effusion has resolved. There is no evidence of pulmonary edema or other acute cardiopulmonary abnormality.   Electronically  Signed   By: David  Martinique M.D.   On: 04/28/2015 13:41      Recent Lab Findings: Lab Results  Component Value Date   WBC 5.0 04/05/2015   HGB 11.2* 04/05/2015   HCT 33.0* 04/05/2015   PLT 410.0* 04/05/2015   GLUCOSE 92 03/22/2015   CHOL 122 04/22/2015   TRIG 91.0 04/22/2015   HDL 44.90 04/22/2015   LDLCALC 59 04/22/2015   ALT 12 04/22/2015   AST 17 04/22/2015   NA 137 03/22/2015   K 4.0 03/22/2015   CL 105 03/22/2015  CREATININE 1.16 03/22/2015   BUN 12 03/22/2015   CO2 27 03/22/2015   TSH 1.913 04/15/2014   INR 1.66* 03/18/2015      Assessment / Plan:      Patient doing well following recent emergency coronary artery bypass grafting  The right lower leg is somewhat swollen diffusely compared to the left-  We will plan lower extremity venous Dopplers to rule out DVT.  Plan see the patient back in 3-4 weeks    Grace Isaac MD      [facility] [place] [address] Office [phone]   Beeper [phone]  04/28/2015 3:08 PM

## 2015-04-28 NOTE — Progress Notes (Signed)
Right Lower Ext. Venous Duplex Completed. Preliminary results by tech - Negative for DVT or SVT. There is a large cystic structure in the proximal calf extending into the distal calf along the s/p CABG incision site. Oda Cogan, BS, RDMS, RVT

## 2015-05-02 DIAGNOSIS — H43813 Vitreous degeneration, bilateral: Secondary | ICD-10-CM | POA: Diagnosis not present

## 2015-05-03 IMAGING — CR DG CHEST 1V PORT
1 series · 2 of 2 positions shown · non-contrast
Comparison: None

CLINICAL DATA: Post CABG

EXAM:
PORTABLE CHEST - 1 VIEW

[Series 1: portable · 0.17mm/px · 2 of 2 slices shown]
[im 1/2]
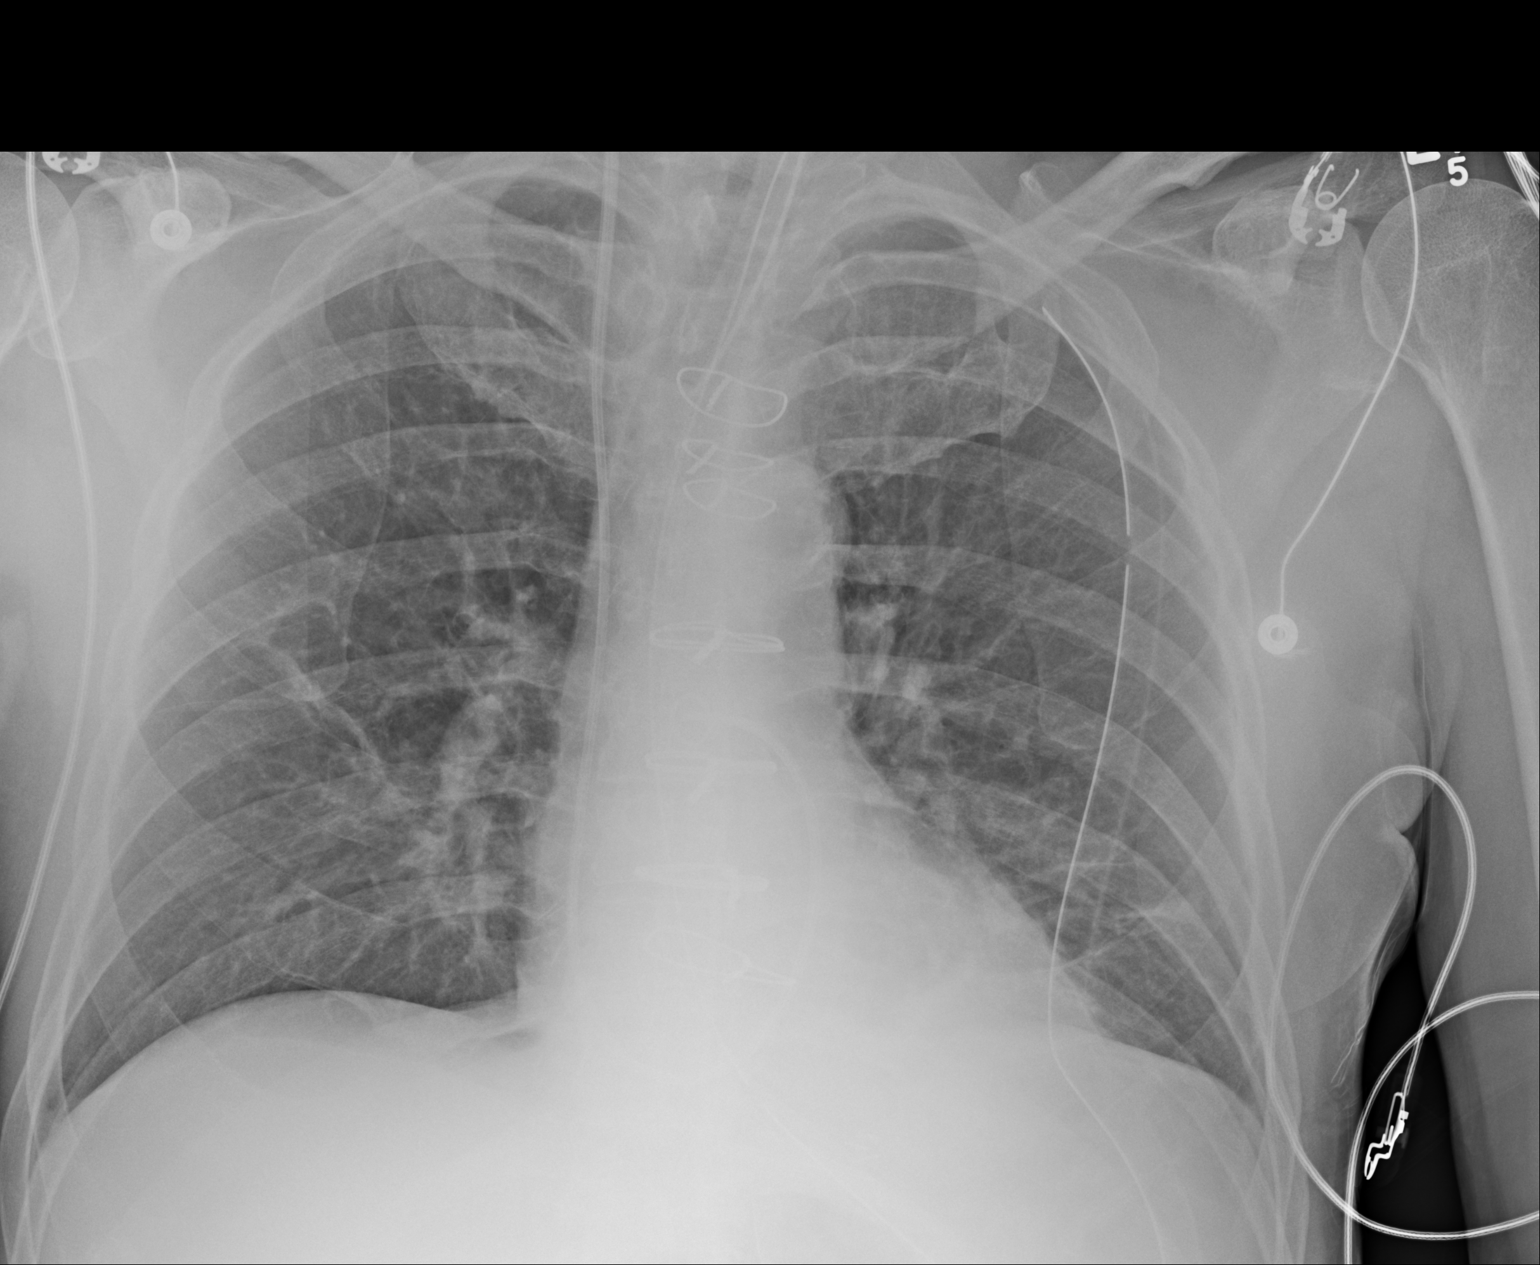
[im 2/2]
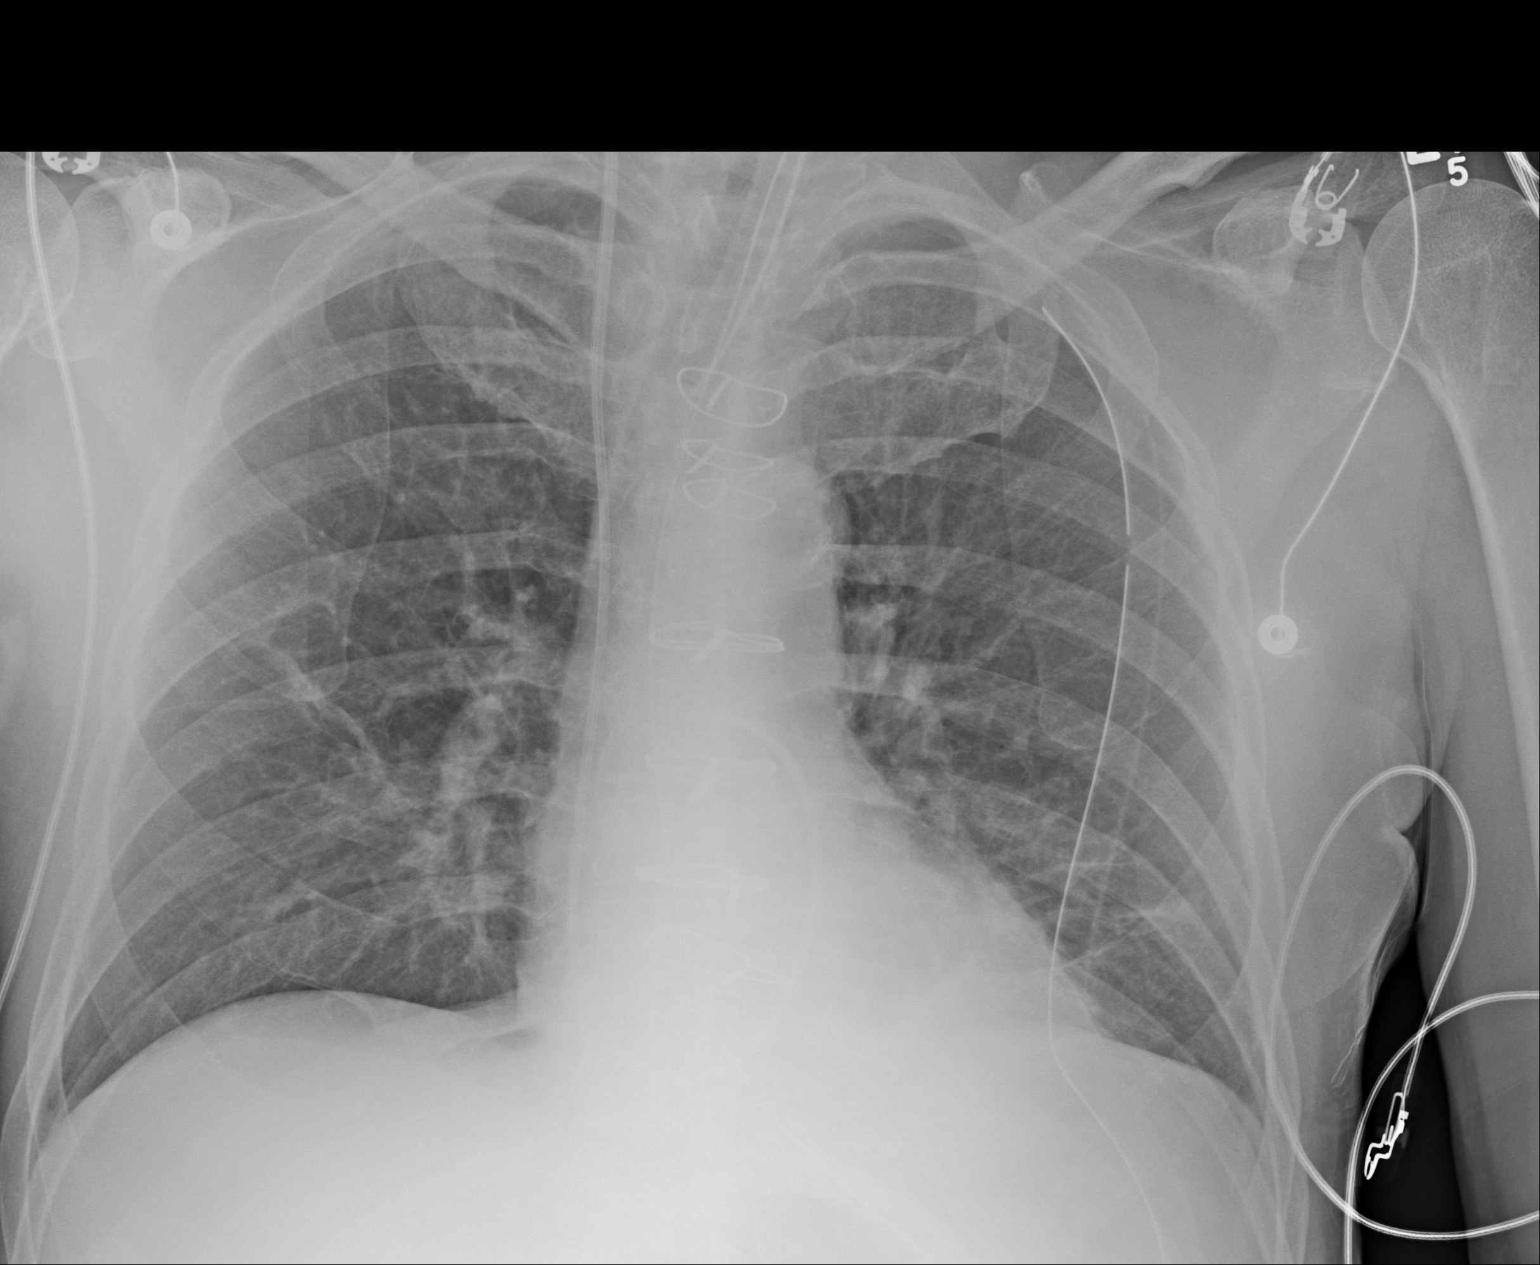

[2 of 2 positions shown; findings below may reference images not displayed]

FINDINGS: Cardiomediastinal silhouette is unremarkable. The patient is status
post CABG. Right IJ Swan-Ganz catheter with tip in the region of
main pulmonary artery. Endotracheal tube in place with tip 4.3 cm
above the carina. NG tube in place. Left chest tube in place. There
is no pneumothorax. No acute infiltrate or pulmonary edema.
IMPRESSION: Status post CABG.  Support apparatus in place.  No pneumothorax.

## 2015-05-04 IMAGING — CR DG CHEST 1V PORT
2 series · 2 of 2 positions shown · non-contrast
Comparison: Radiograph 03/18/2015

CLINICAL DATA: Status post CABG

EXAM:
PORTABLE CHEST - 1 VIEW

[AP (1 of 2)]
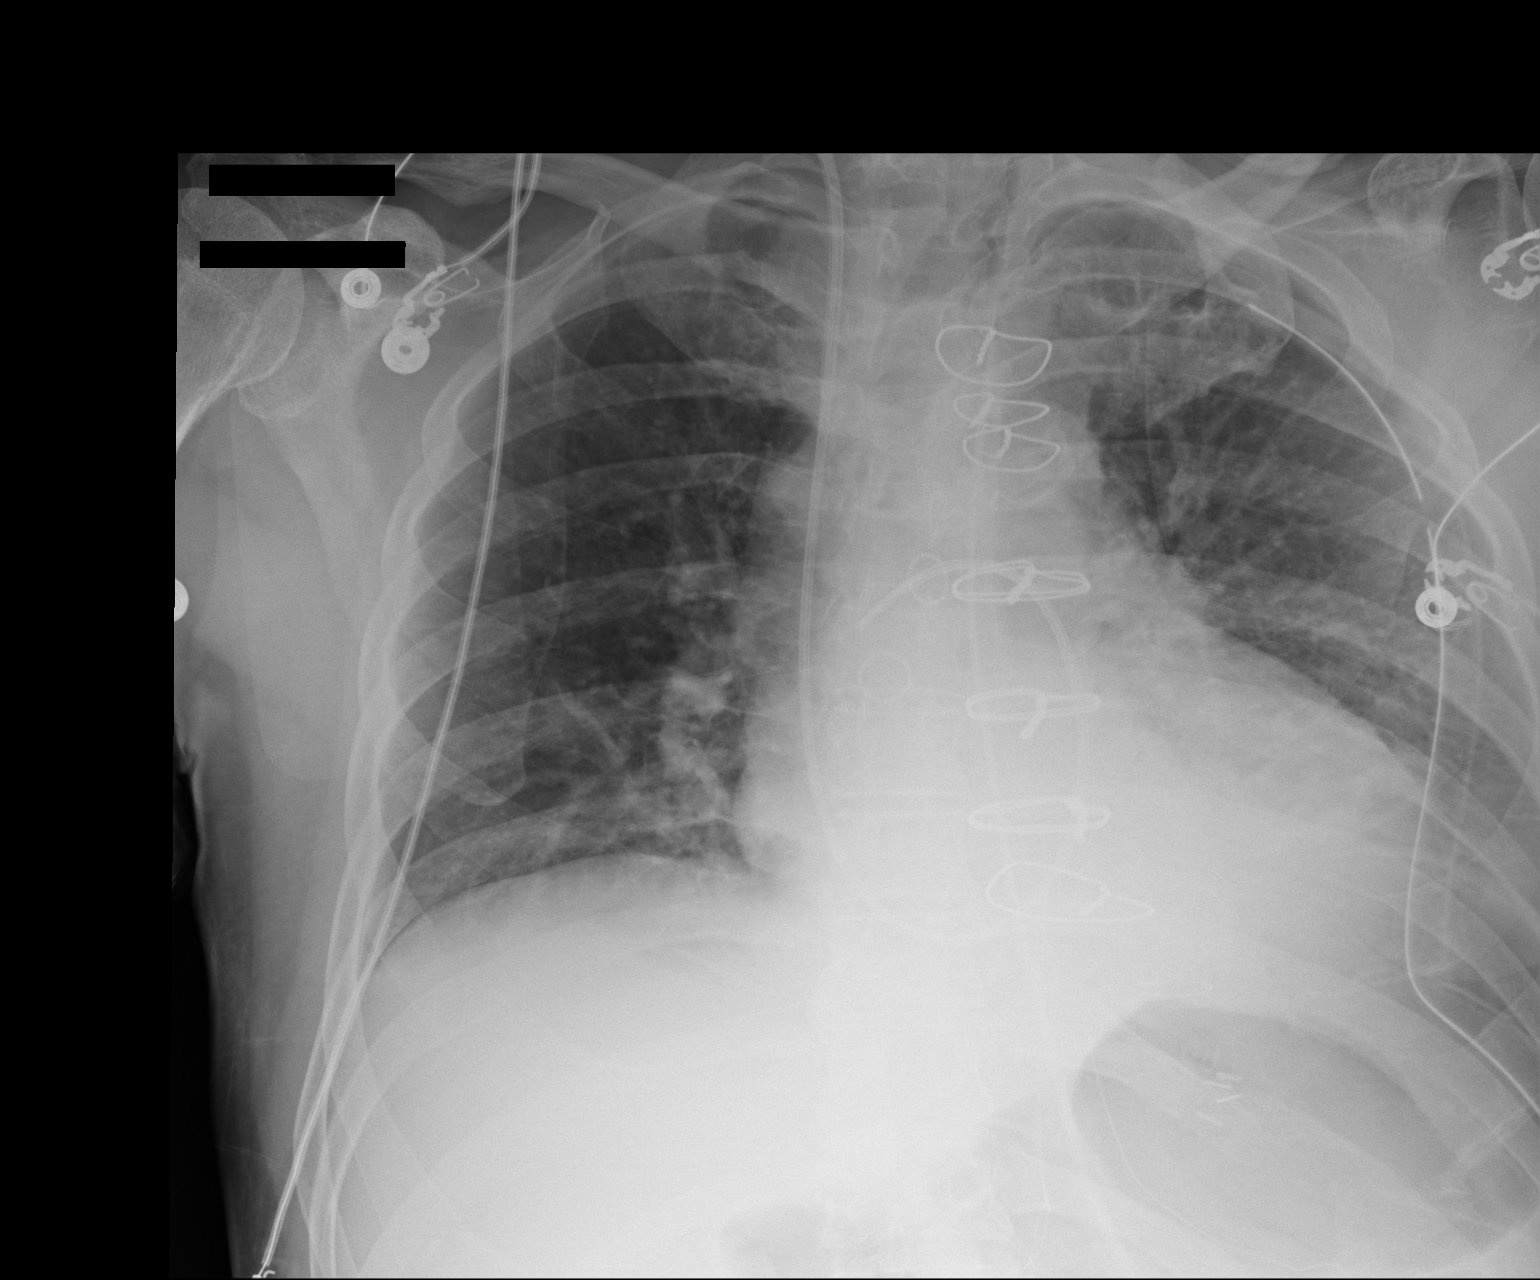

[AP (2 of 2)]
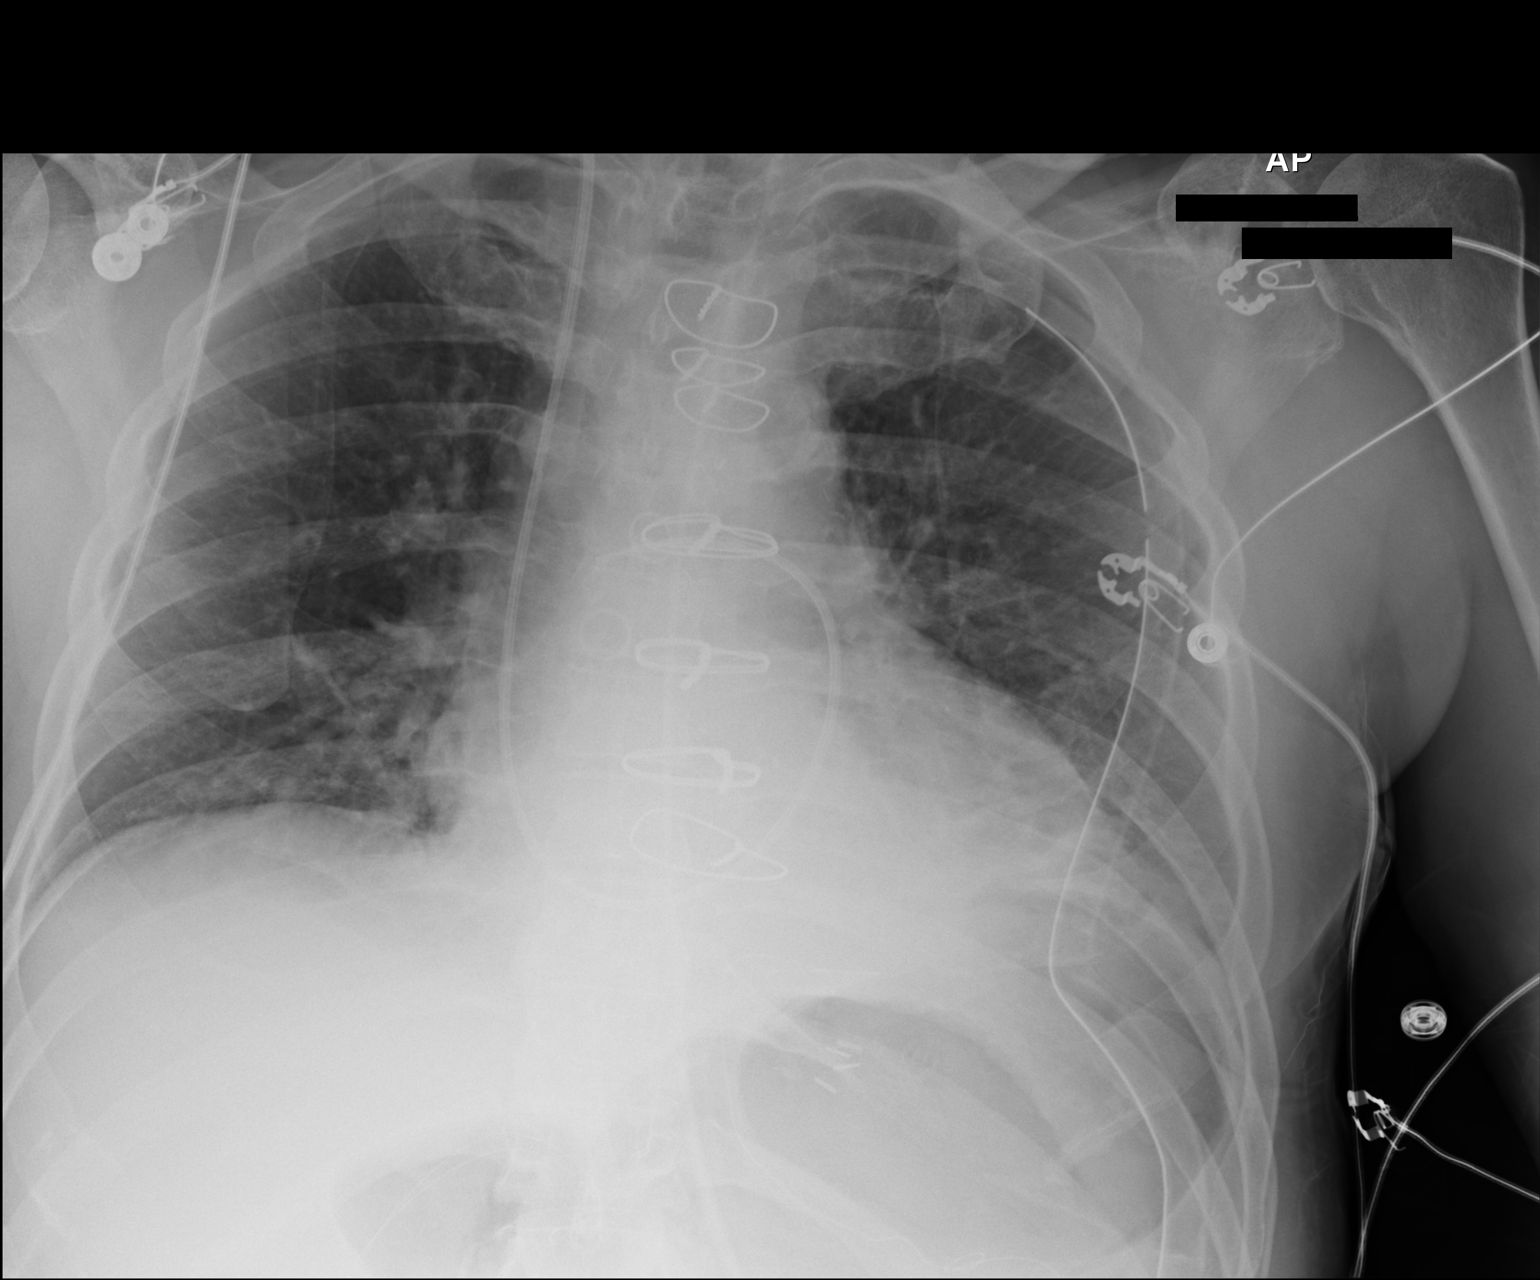

[2 of 2 positions shown; findings below may reference images not displayed]

FINDINGS: Sternotomy wires overlie normal cardiac silhouette. Swan-Ganz
catheter with tip in the right main pulmonary artery. Left-sided
chest tube in place without pneumothorax. There is left basilar
atelectasis and small effusion. Interval extubation.
IMPRESSION: 1. Extubation with mild increase in left lower lobe atelectasis.
2. Left chest tube in place without pneumothorax.

## 2015-05-19 ENCOUNTER — Encounter: Payer: Self-pay | Admitting: Cardiothoracic Surgery

## 2015-05-19 ENCOUNTER — Ambulatory Visit (INDEPENDENT_AMBULATORY_CARE_PROVIDER_SITE_OTHER): Payer: Self-pay | Admitting: Cardiothoracic Surgery

## 2015-05-19 VITALS — BP 160/90 | HR 69 | Resp 20 | Ht 72.0 in | Wt 163.0 lb

## 2015-05-19 DIAGNOSIS — I2510 Atherosclerotic heart disease of native coronary artery without angina pectoris: Secondary | ICD-10-CM

## 2015-05-19 DIAGNOSIS — Z951 Presence of aortocoronary bypass graft: Secondary | ICD-10-CM

## 2015-05-19 NOTE — Progress Notes (Signed)
[address]       [place] [address]             [phone]      Cordero A Sean Hurley Medical Record #[id number] Date of Birth: 01-Aug-1941  Referring: Jettie Booze, MD Primary Care: Vikki Ports, MD  Chief Complaint:   POST OP FOLLOW UP 03/18/2015 OPERATIVE REPORT PREOPERATIVE DIAGNOSIS: Critical left main and ostial right disease with positive stress test. POSTOPERATIVE DIAGNOSIS: Critical left main and ostial right disease with positive stress test. SURGICAL PROCEDURE: Emergency coronary artery bypass grafting for critical anatomy. Coronary artery bypass grafting x4 with left internal mammary to the left anterior descending coronary artery, sequential reverse saphenous vein graft to the intermediate coronary artery and circumflex coronary artery, reverse saphenous vein graft to the distal right coronary artery with right thigh and calf greater saphenous vein harvesting endoscopically. SURGEON: Lanelle Bal, MD  History of Present Illness:     Patient has returned to near normal activities following emergency bypass surgery April 8. He declined any interest in going to cardiac rehabilitation. He denies any recurrent angina. Notes that he's able to do his usual amount of activity without the shortness of breath that he was previously experiencing. Venous doppler done after last visit no DVT.  Patient notes he has not been taking ASA although is on his med list.     Past Medical History  Diagnosis Date  . CAD (coronary artery disease)     a. PCI in 1991 (in [place]); b. LHC 4/16: dLM 95%, severe ostial LAD and ostial CFX disease extending from the LM, severe oRI, pRCA 99%, EF 60% >> CABG  . HTN (hypertension)   . Renal cyst     left  . Gilbert syndrome     elevated indirect bilirubin  . Inguinal hernia     R>L, easily reducible  . HLD (hyperlipidemia)   . PAF (paroxysmal atrial fibrillation)     post CABG >> converted to  NSR with Amiodarone     History  Smoking status  . Never Smoker   Smokeless tobacco  . Never Used    History  Alcohol Use  . 0.0 oz/week  . 0 Standard drinks or equivalent per week    Comment: bourbon and ginger daily, wine with dinner, stinger in the evening.     No Known Allergies  Current Outpatient Prescriptions  Medication Sig Dispense Refill  . amiodarone (PACERONE) 200 MG tablet Take 1 tablet (200 mg total) by mouth 2 (two) times daily. For 2 weeks;then take Amiodarone 200 mg by mouth daily thereafter. (Patient not taking: Reported on 05/19/2015) 30 tablet 1  . aspirin EC 325 MG EC tablet Take 1 tablet (325 mg total) by mouth daily. 30 tablet 0  . atorvastatin (LIPITOR) 80 MG tablet Take 1 tablet (80 mg total) by mouth daily. 90 tablet 3  . Biotin 5000 MCG CAPS Take 1 capsule by mouth daily.    . Calcium Carbonate-Vit D-Min (CALCIUM 600+D PLUS MINERALS) 600-400 MG-UNIT TABS Take 1 tablet by mouth daily.      . carvedilol (COREG) 3.125 MG tablet Take 1 tablet (3.125 mg total) by mouth 2 (two) times daily with a meal. (Patient not taking: Reported on 05/19/2015) 60 tablet 3  . cholecalciferol (VITAMIN D) 1000 UNITS tablet Take 1,000 Units by mouth daily.    . ferrous sulfate 325 (65 FE) MG tablet Take 1 tablet (325 mg total) by mouth daily  with breakfast. For one month then stop.  3  . fish oil-omega-3 fatty acids 1000 MG capsule Take 1 g by mouth daily.      . folic acid (FOLVITE) 1 MG tablet Take 1 tablet (1 mg total) by mouth daily. For one month then stop. (Patient not taking: Reported on 05/19/2015)    . furosemide (LASIX) 40 MG tablet Take 1 tablet (40 mg total) by mouth daily. For 5 days then stop. (Patient not taking: Reported on 05/19/2015) 5 tablet 0  . Glucosamine 500 MG CAPS Take 1 capsule by mouth daily.     Marland Kitchen lisinopril (PRINIVIL,ZESTRIL) 5 MG tablet Take 1 tablet (5 mg total) by mouth daily. 30 tablet 1  . Multiple Vitamins-Minerals (CENTRUM SILVER PO) Take 1 tablet by  mouth daily.      . NON FORMULARY raisen soaked in gin    . oxyCODONE (OXY IR/ROXICODONE) 5 MG immediate release tablet Take 1-2 tablets (5-10 mg total) by mouth every 4 (four) hours as needed for severe pain. (Patient not taking: Reported on 05/19/2015) 30 tablet 0  . potassium chloride SA (K-DUR,KLOR-CON) 20 MEQ tablet Take 1 tablet (20 mEq total) by mouth daily. For 5 days then stop. (Patient not taking: Reported on 05/19/2015) 5 tablet 0  . Probiotic Product (PROBIOTIC DAILY PO) Take 1 tablet by mouth daily.     . saw palmetto 160 MG capsule Take 160 mg by mouth daily.     Marland Kitchen ULORIC 40 MG tablet Take 1 tablet by mouth  daily 90 tablet 0  . Zinc Sulfate (ZINC 15 PO) Take 1 tablet by mouth daily.       No current facility-administered medications for this visit.       Physical Exam: BP 160/90 mmHg  Pulse 69  Resp 20  Ht 6' (1.829 m)  Wt 163 lb (73.936 kg)  BMI 22.10 kg/m2  SpO2 95%  General appearance: alert, cooperative and appears stated age Neurologic: intact Heart: regular rate and rhythm, S1, S2 normal, no murmur, click, rub or gallop Lungs: clear to auscultation bilaterally Abdomen: soft, non-tender; bowel sounds normal; no masses,  no organomegaly Extremities:  Left extremities normal, atraumatic, no cyanosis or edema and Homans sign is negative, no sign of DVT,  Right leg below the knee 's were vein was harvested but is still slightly enlarged but not tender especially compared to the left improved from two weeks ago. No evidence of infection. Wound:  Sternum is stable and well healed   Diagnostic Studies & Laboratory data:     Recent Radiology Findings:   No results found.    Recent Lab Findings: Lab Results  Component Value Date   WBC 5.0 04/05/2015   HGB 11.2* 04/05/2015   HCT 33.0* 04/05/2015   PLT 410.0* 04/05/2015   GLUCOSE 92 03/22/2015   CHOL 122 04/22/2015   TRIG 91.0 04/22/2015   HDL 44.90 04/22/2015   LDLCALC 59 04/22/2015   ALT 12 04/22/2015   AST  17 04/22/2015   NA 137 03/22/2015   K 4.0 03/22/2015   CL 105 03/22/2015   CREATININE 1.16 03/22/2015   BUN 12 03/22/2015   CO2 27 03/22/2015   TSH 1.913 04/15/2014   INR 1.66* 03/18/2015   Venous Doppler: Summary:  - No evidence of deep vein or superficial thrombosis involving the right lower extremity and left common femoral vein. A large cystic structure with mixed echogenicity was noted in the right proximal calf area extending into the distal calf. -  No evidence of Baker&'s cyst on the right.  Other specific details can be found in the table(s) above. Prepared and Electronically Authenticated by    Assessment / Plan:      Patient doing well following recent emergency coronary artery bypass grafting  left leg vein harvest site much improved. Patient  encouraged to take asa and statin as ordered. Will see back as needed  Grace Isaac MD      [address] , [address] Office [phone]   Beeper [phone]  05/19/2015 9:14 AM

## 2015-05-19 NOTE — Patient Instructions (Signed)
Remember to take your Aspirin  To lifting over 25 lbs for 3 months

## 2015-05-23 ENCOUNTER — Other Ambulatory Visit: Payer: Medicare Other

## 2015-05-23 DIAGNOSIS — E7800 Pure hypercholesterolemia, unspecified: Secondary | ICD-10-CM

## 2015-05-23 DIAGNOSIS — Z5181 Encounter for therapeutic drug level monitoring: Secondary | ICD-10-CM | POA: Diagnosis not present

## 2015-05-23 DIAGNOSIS — M109 Gout, unspecified: Secondary | ICD-10-CM | POA: Diagnosis not present

## 2015-05-23 DIAGNOSIS — Z125 Encounter for screening for malignant neoplasm of prostate: Secondary | ICD-10-CM | POA: Diagnosis not present

## 2015-05-23 DIAGNOSIS — I10 Essential (primary) hypertension: Secondary | ICD-10-CM | POA: Diagnosis not present

## 2015-05-23 LAB — COMPREHENSIVE METABOLIC PANEL
ALK PHOS: 66 U/L (ref 39–117)
ALT: 15 U/L (ref 0–53)
AST: 21 U/L (ref 0–37)
Albumin: 4.1 g/dL (ref 3.5–5.2)
BUN: 15 mg/dL (ref 6–23)
CHLORIDE: 102 meq/L (ref 96–112)
CO2: 27 meq/L (ref 19–32)
Calcium: 9.5 mg/dL (ref 8.4–10.5)
Creat: 1.2 mg/dL (ref 0.50–1.35)
GLUCOSE: 82 mg/dL (ref 70–99)
POTASSIUM: 4.1 meq/L (ref 3.5–5.3)
SODIUM: 140 meq/L (ref 135–145)
TOTAL PROTEIN: 6.9 g/dL (ref 6.0–8.3)
Total Bilirubin: 0.9 mg/dL (ref 0.2–1.2)

## 2015-05-23 LAB — LIPID PANEL
Cholesterol: 161 mg/dL (ref 0–200)
HDL: 62 mg/dL (ref >=40)
LDL Cholesterol: 81 mg/dL (ref 0–99)
TRIGLYCERIDES: 92 mg/dL (ref <150)
Total CHOL/HDL Ratio: 2.6 Ratio
VLDL: 18 mg/dL (ref 0–40)

## 2015-05-23 LAB — CBC WITH DIFFERENTIAL/PLATELET
Basophils Absolute: 0 10*3/uL (ref 0.0–0.1)
Basophils Relative: 0 % (ref 0–1)
EOS ABS: 0.2 10*3/uL (ref 0.0–0.7)
EOS PCT: 4 % (ref 0–5)
HEMATOCRIT: 40.5 % (ref 39.0–52.0)
Hemoglobin: 13.5 g/dL (ref 13.0–17.0)
Lymphocytes Relative: 27 % (ref 12–46)
Lymphs Abs: 1.4 10*3/uL (ref 0.7–4.0)
MCH: 30.3 pg (ref 26.0–34.0)
MCHC: 33.3 g/dL (ref 30.0–36.0)
MCV: 91 fL (ref 78.0–100.0)
MONOS PCT: 18 % — AB (ref 3–12)
MPV: 9.7 fL (ref 8.6–12.4)
Monocytes Absolute: 0.9 10*3/uL (ref 0.1–1.0)
Neutro Abs: 2.6 10*3/uL (ref 1.7–7.7)
Neutrophils Relative %: 51 % (ref 43–77)
Platelets: 222 10*3/uL (ref 150–400)
RBC: 4.45 MIL/uL (ref 4.22–5.81)
RDW: 14 % (ref 11.5–15.5)
WBC: 5.1 10*3/uL (ref 4.0–10.5)

## 2015-05-23 LAB — TSH: TSH: 3.327 u[IU]/mL (ref 0.350–4.500)

## 2015-05-23 LAB — URIC ACID: Uric Acid, Serum: 4.9 mg/dL (ref 4.0–7.8)

## 2015-05-24 LAB — PSA, MEDICARE: PSA: 0.61 ng/mL (ref <=4.00)

## 2015-05-26 ENCOUNTER — Ambulatory Visit (INDEPENDENT_AMBULATORY_CARE_PROVIDER_SITE_OTHER): Payer: Medicare Other | Admitting: Family Medicine

## 2015-05-26 ENCOUNTER — Encounter: Payer: Self-pay | Admitting: Family Medicine

## 2015-05-26 VITALS — BP 120/60 | HR 72 | Ht 71.5 in | Wt 162.8 lb

## 2015-05-26 DIAGNOSIS — I10 Essential (primary) hypertension: Secondary | ICD-10-CM | POA: Diagnosis not present

## 2015-05-26 DIAGNOSIS — E7800 Pure hypercholesterolemia, unspecified: Secondary | ICD-10-CM

## 2015-05-26 DIAGNOSIS — M109 Gout, unspecified: Secondary | ICD-10-CM

## 2015-05-26 DIAGNOSIS — Z951 Presence of aortocoronary bypass graft: Secondary | ICD-10-CM | POA: Diagnosis not present

## 2015-05-26 DIAGNOSIS — Z5181 Encounter for therapeutic drug level monitoring: Secondary | ICD-10-CM

## 2015-05-26 MED ORDER — FEBUXOSTAT 40 MG PO TABS: ORAL_TABLET | ORAL | Status: DC

## 2015-05-26 NOTE — Progress Notes (Signed)
Chief Complaint  Patient presents with  . Hypertension    nonfasting med check, labs already done.   Recently went to [place] to visit his children, to show them that he is "fine", after having CABG.  He leaves tomorrow to see his son in [place] one who has cancer, and likely <1 year to live.  CAD:  He had emergency bypass surgery in April and is doing well.  No longer having any exertional dyspnea. He last saw Dr. Servando Snare last week for follow-up. It was noted that he wasn't taking his aspirin, but he reports he is now.  He is under the care of Dr. Irish Lack, and has appointment next week.  His BP's had crept up in March/April, which led him to see Dr. Irish Lack, where he then had abnormal stress test.  He reports feeling significantly better within 24 hours of surgery.  Still has some mild persistent swelling of the right ankle. It is improving.  He had u/s to r/o DVT.  Hyperlipidemia follow-up: Compliant with medications and denies medication side effects.Diet is usually admittedly worse in the Spring (baseball season).  He now tries to eat prior to going to the ballpark, so he can eat a little healthier.  Since he isn't working, his lunch/eating habits have changed some--eating more grilled cheese and salami sandwiches, drinking buttermilk. He does plans to go back to work very soon--got a job at another [employer].  Review of chart shows the fluctuations in his lipids seasonally, always higher in April/May than in October/November. His Vytorin was changed to Lipitor 80mg  in the Spring of 2015 by Dr. Irish Lack.  HTN: Blood pressures at home are running 120/70 up to a high of 162/79.  Review of his printed sheet shows that in May BP's were better, running most frequently in the 120's (but went up to 150's), versus in June, seeing more 409-811 systolic, and very rarely in the 120's.  He can't account for the differences based on diet or activity.  He plans to discuss this next week with his  cardiologist.  Denies cough, headaches, dizziness, chest pain, shortness of breath.  Gout: Hasn't had any flares. Remains on uloric. He is no longer on HCTZ.   Inguinal hernias--remain asymptomatic.   PMH, PSH, SH and FH updated and reviewed.  Outpatient Encounter Prescriptions as of 05/26/2015  Medication Sig Note  . aspirin EC 325 MG EC tablet Take 1 tablet (325 mg total) by mouth daily.   Marland Kitchen atorvastatin (LIPITOR) 80 MG tablet Take 1 tablet (80 mg total) by mouth daily.   . Biotin 5000 MCG CAPS Take 1 capsule by mouth daily.   . Calcium Carbonate-Vit D-Min (CALCIUM 600+D PLUS MINERALS) 600-400 MG-UNIT TABS Take 1 tablet by mouth daily.     . cholecalciferol (VITAMIN D) 1000 UNITS tablet Take 1,000 Units by mouth daily.   . fish oil-omega-3 fatty acids 1000 MG capsule Take 1 g by mouth daily.     . Glucosamine 500 MG CAPS Take 1 capsule by mouth daily.    Marland Kitchen lisinopril (PRINIVIL,ZESTRIL) 5 MG tablet Take 1 tablet (5 mg total) by mouth daily.   . Multiple Vitamins-Minerals (CENTRUM SILVER PO) Take 1 tablet by mouth daily.     . NON FORMULARY raisen soaked in gin 05/26/2015: .  Marland Kitchen Probiotic Product (PROBIOTIC DAILY PO) Take 1 tablet by mouth daily.    . saw palmetto 160 MG capsule Take 160 mg by mouth daily.    Marland Kitchen ULORIC 40 MG tablet  Take 1 tablet by mouth  daily   . Zinc Sulfate (ZINC 15 PO) Take 1 tablet by mouth daily.     . [DISCONTINUED] ferrous sulfate 325 (65 FE) MG tablet Take 1 tablet (325 mg total) by mouth daily with breakfast. For one month then stop.    No facility-administered encounter medications on file as of 05/26/2015.   No Known Allergies  ROS: no fever, chills, headaches, dizziness, numbness, tingling, weakness.  No URI/allergy symptoms, cough, shortness of breath, chest pain. No bleeding, bruising, rashes, myalgias.  Denies depression, insomnia.  No GI or GU complaints, joint pains.  PHYSICAL EXAM: BP 120/60 mmHg  Pulse 72  Ht 5' 11.5" (1.816 m)  Wt 162 lb 12.8  oz (73.846 kg)  BMI 22.39 kg/m2  Well developed, pleasant, thin, suntanned male in good spirits HEENT: PERRL, EOMI, conjunctiva clear.  OP normal Neck: no lymphadenopathy, thyromegaly or carotid bruit Heart: regular rate and rhythm Chest: WHSS midline chest, well-healed Back: no CVA or spinal tenderness Abdomen: soft, nontender, no organomegaly or mass Extremities: WHSS on RLE.  Mild edema at right ankle, nonpitting. 2+ pulses Skin: no rashes/lesions Neuro: alert and oriented, cranial nerves grossly intact; normal strength, gait Psych: normal mood, affect, hygiene and grooming   Lab Results  Component Value Date   WBC 5.1 05/23/2015   HGB 13.5 05/23/2015   HCT 40.5 05/23/2015   MCV 91.0 05/23/2015   PLT 222 05/23/2015   Lab Results  Component Value Date   CHOL 161 05/23/2015   HDL 62 05/23/2015   LDLCALC 81 05/23/2015   TRIG 92 05/23/2015   CHOLHDL 2.6 05/23/2015   Lab Results  Component Value Date   TSH 3.327 05/23/2015     Chemistry      Component Value Date/Time   NA 140 05/23/2015 0001   K 4.1 05/23/2015 0001   CL 102 05/23/2015 0001   CO2 27 05/23/2015 0001   BUN 15 05/23/2015 0001   CREATININE 1.20 05/23/2015 0001   CREATININE 1.16 03/22/2015 0333      Component Value Date/Time   CALCIUM 9.5 05/23/2015 0001   ALKPHOS 66 05/23/2015 0001   AST 21 05/23/2015 0001   ALT 15 05/23/2015 0001   BILITOT 0.9 05/23/2015 0001     Glucose 82 Uric acid 4.9  Lab Results  Component Value Date   PSA 0.61 05/23/2015   PSA 0.57 04/15/2014   PSA 0.67 04/03/2013   ASSESSMENT/PLAN:  Pure hypercholesterolemia - Goal LDL<70; slightly higher than last month (at cardiologist); diet reviewed.  Gout without tophus, unspecified cause, unspecified chronicity, unspecified site - trial of decreasing uloric to 1/2 tablet given that he is no longer on HCTZ - Plan: febuxostat (ULORIC) 40 MG tablet  S/P CABG (coronary artery bypass graft) - doing well, no angina  Essential  hypertension, benign - normal today, slightly higher at home; low sodium diet reviewed  Low sodium diet and low cholesterol diet reviewed.  LDL goal <70  F/u 6 months for CPE, with fasting labs prior.  Lipid, LFT, uric acid (due to lowering uloric dose)

## 2015-05-26 NOTE — Patient Instructions (Signed)
Since you are no longer on the HCTZ, you might do just as well on 1/2 tablet of the uloric (HCTZ can raise uric acid levels).  You can try taking 1/2 tablet daily which would help save $, and probably still keep the uric acid level <6 and prevent gout flares.  If you have any concerns we can recheck the uric acid level prior to your next appointment, or if you have a flare, after it resolves, increase back to the full tablet.  Your goal LDL is <70--try and cut back on the cheese, red meat, butter, etc so that we can get it to goal without having to change your medications.  Continue to monitor your blood pressure, and discuss with Dr. Irish Lack.  The pastrami on your sandwiches is processed and high in sodium.  Try and follow a low sodium diet to help keep the BP low.  Low-Sodium Eating Plan Sodium raises blood pressure and causes water to be held in the body. Getting less sodium from food will help lower your blood pressure, reduce any swelling, and protect your heart, liver, and kidneys. We get sodium by adding salt (sodium chloride) to food. Most of our sodium comes from canned, boxed, and frozen foods. Restaurant foods, fast foods, and pizza are also very high in sodium. Even if you take medicine to lower your blood pressure or to reduce fluid in your body, getting less sodium from your food is important. WHAT IS MY PLAN? Most people should limit their sodium intake to 2,300 mg a day. Your health care provider recommends that you limit your sodium intake to __________ a day.  WHAT DO I NEED TO KNOW ABOUT THIS EATING PLAN? For the low-sodium eating plan, you will follow these general guidelines:  Choose foods with a % Daily Value for sodium of less than 5% (as listed on the food label).   Use salt-free seasonings or herbs instead of table salt or sea salt.   Check with your health care provider or pharmacist before using salt substitutes.   Eat fresh foods.  Eat more vegetables and  fruits.  Limit canned vegetables. If you do use them, rinse them well to decrease the sodium.   Limit cheese to 1 oz (28 g) per day.   Eat lower-sodium products, often labeled as "lower sodium" or "no salt added."  Avoid foods that contain monosodium glutamate (MSG). MSG is sometimes added to [place] food and some canned foods.  Check food labels (Nutrition Facts labels) on foods to learn how much sodium is in one serving.  Eat more home-cooked food and less restaurant, buffet, and fast food.  When eating at a restaurant, ask that your food be prepared with less salt or none, if possible.  HOW DO I READ FOOD LABELS FOR SODIUM INFORMATION? The Nutrition Facts label lists the amount of sodium in one serving of the food. If you eat more than one serving, you must multiply the listed amount of sodium by the number of servings. Food labels may also identify foods as:  Sodium free--Less than 5 mg in a serving.  Very low sodium--35 mg or less in a serving.  Low sodium--140 mg or less in a serving.  Light in sodium--50% less sodium in a serving. For example, if a food that usually has 300 mg of sodium is changed to become light in sodium, it will have 150 mg of sodium.  Reduced sodium--25% less sodium in a serving. For example, if a food that  usually has 400 mg of sodium is changed to reduced sodium, it will have 300 mg of sodium. WHAT FOODS CAN I EAT? Grains Low-sodium cereals, including oats, puffed wheat and rice, and shredded wheat cereals. Low-sodium crackers. Unsalted rice and pasta. Lower-sodium bread.  Vegetables Frozen or fresh vegetables. Low-sodium or reduced-sodium canned vegetables. Low-sodium or reduced-sodium tomato sauce and paste. Low-sodium or reduced-sodium tomato and vegetable juices.  Fruits Fresh, frozen, and canned fruit. Fruit juice.  Meat and Other Protein Products Low-sodium canned tuna and salmon. Fresh or frozen meat, poultry, seafood, and fish.  Lamb. Unsalted nuts. Dried beans, peas, and lentils without added salt. Unsalted canned beans. Homemade soups without salt. Eggs.  Dairy Milk. Soy milk. Ricotta cheese. Low-sodium or reduced-sodium cheeses. Yogurt.  Condiments Fresh and dried herbs and spices. Salt-free seasonings. Onion and garlic powders. Low-sodium varieties of mustard and ketchup. Lemon juice.  Fats and Oils Reduced-sodium salad dressings. Unsalted butter.  Other Unsalted popcorn and pretzels.  The items listed above may not be a complete list of recommended foods or beverages. Contact your dietitian for more options. WHAT FOODS ARE NOT RECOMMENDED? Grains Instant hot cereals. Bread stuffing, pancake, and biscuit mixes. Croutons. Seasoned rice or pasta mixes. Noodle soup cups. Boxed or frozen macaroni and cheese. Self-rising flour. Regular salted crackers. Vegetables Regular canned vegetables. Regular canned tomato sauce and paste. Regular tomato and vegetable juices. Frozen vegetables in sauces. Salted french fries. Olives. Angie Fava. Relishes. Sauerkraut. Salsa. Meat and Other Protein Products Salted, canned, smoked, spiced, or pickled meats, seafood, or fish. Bacon, ham, sausage, hot dogs, corned beef, chipped beef, and packaged luncheon meats. Salt pork. Jerky. Pickled herring. Anchovies, regular canned tuna, and sardines. Salted nuts. Dairy Processed cheese and cheese spreads. Cheese curds. Blue cheese and cottage cheese. Buttermilk.  Condiments Onion and garlic salt, seasoned salt, table salt, and sea salt. Canned and packaged gravies. Worcestershire sauce. Tartar sauce. Barbecue sauce. Teriyaki sauce. Soy sauce, including reduced sodium. Steak sauce. Fish sauce. Oyster sauce. Cocktail sauce. Horseradish. Regular ketchup and mustard. Meat flavorings and tenderizers. Bouillon cubes. Hot sauce. Tabasco sauce. Marinades. Taco seasonings. Relishes. Fats and Oils Regular salad dressings. Salted butter.  Margarine. Ghee. Bacon fat.  Other Potato and tortilla chips. Corn chips and puffs. Salted popcorn and pretzels. Canned or dried soups. Pizza. Frozen entrees and pot pies.  The items listed above may not be a complete list of foods and beverages to avoid. Contact your dietitian for more information. Document Released: 05/18/2002 Document Revised: 12/01/2013 Document Reviewed: 09/30/2013 [facility] Patient Information 2015 [place], [place]. This information is not intended to replace advice given to you by your health care provider. Make sure you discuss any questions you have with your health care provider.

## 2015-05-31 ENCOUNTER — Other Ambulatory Visit: Payer: Self-pay | Admitting: Family Medicine

## 2015-06-01 ENCOUNTER — Encounter: Payer: Self-pay | Admitting: Interventional Cardiology

## 2015-06-01 ENCOUNTER — Ambulatory Visit (INDEPENDENT_AMBULATORY_CARE_PROVIDER_SITE_OTHER): Payer: Medicare Other | Admitting: Interventional Cardiology

## 2015-06-01 VITALS — BP 110/64 | HR 76 | Ht 71.5 in | Wt 162.6 lb

## 2015-06-01 DIAGNOSIS — E7800 Pure hypercholesterolemia, unspecified: Secondary | ICD-10-CM

## 2015-06-01 DIAGNOSIS — I10 Essential (primary) hypertension: Secondary | ICD-10-CM | POA: Diagnosis not present

## 2015-06-01 DIAGNOSIS — I2510 Atherosclerotic heart disease of native coronary artery without angina pectoris: Secondary | ICD-10-CM | POA: Diagnosis not present

## 2015-06-01 DIAGNOSIS — Z951 Presence of aortocoronary bypass graft: Secondary | ICD-10-CM

## 2015-06-01 NOTE — Progress Notes (Signed)
Patient ID: Bradley Terrell, male   DOB: [birth year], 74 y.o.   MRN: [id number]     Cardiology Office Note   Date:  06/01/2015   ID:  Bradley Terrell, DOB 1941/02/06, MRN [id number]  PCP:  Vikki Ports, MD    Chief Complaint  Patient presents with  . Follow-up    S/P CABG     Wt Readings from Last 3 Encounters:  06/01/15 162 lb 9.6 oz (73.755 kg)  05/26/15 162 lb 12.8 oz (73.846 kg)  05/19/15 163 lb (73.936 kg)       History of Present Illness: Bradley Terrell is a 74 y.o. male  Who had CABG in April 2016.  He had severe 3 vessel disease and left main disease.  HIs CABG was done urgently.  No CP or SHOB.  He is returned to regular exercise. He did not do cardiac rehabilitation because he is very active on his own. He has not had any significant pain from the surgical site after the first couple of days postoperatively. He did have some right leg swelling. He had a lower extremity DVT which was checked and negative for any DVT.  He is back to attending [employer] games regularly. Overall, he feels back to normal.    Past Medical History  Diagnosis Date  . CAD (coronary artery disease)     a. PCI in 1991 (in [place]); b. LHC 4/16: dLM 95%, severe ostial LAD and ostial CFX disease extending from the LM, severe oRI, pRCA 99%, EF 60% >> CABG  . HTN (hypertension)   . Renal cyst     left  . Gilbert syndrome     elevated indirect bilirubin  . Inguinal hernia     R>L, easily reducible  . HLD (hyperlipidemia)   . PAF (paroxysmal atrial fibrillation)     post CABG >> converted to NSR with Amiodarone    Past Surgical History  Procedure Laterality Date  . Tonsillectomy  age 44  . Cardiac atherectomy  '91    to LAD and RCA  . Prk  '96    (refractive eye surgery)  . Colonoscopy  03/2009, 05/2014  . Left heart catheterization with coronary angiogram N/A 03/18/2015    Procedure: LEFT HEART CATHETERIZATION WITH CORONARY ANGIOGRAM;  Surgeon: Jettie Booze, MD;  Location: [facility] CATH  LAB;  Service: Cardiovascular;  Laterality: N/A;  . Coronary artery bypass graft N/A 03/18/2015    Procedure: CORONARY ARTERY BYPASS GRAFTING (CABG) x  four, LIMA to LAD, SVG-Ramus-dCx, SVG - dRCA using left internal mammary artery and right leg thigh & calf greater saphenous vein harvested endoscopically;  Surgeon: Grace Isaac, MD;  Location: [place];  Service: Open Heart Surgery;  Laterality: N/A;  . Intraoperative transesophageal echocardiogram N/A 03/18/2015    Procedure: INTRAOPERATIVE TRANSESOPHAGEAL ECHOCARDIOGRAM;  Surgeon: Grace Isaac, MD;  Location: [place];  Service: Open Heart Surgery;  Laterality: N/A;  . Endovein harvest of greater saphenous vein Right 03/18/2015    Procedure: ENDOSCPOIC HARVEST OF RIGHT GREATER SAPHENOUS VEIN;  Surgeon: Grace Isaac, MD;  Location: [place];  Service: Open Heart Surgery;  Laterality: Right;     Current Outpatient Prescriptions  Medication Sig Dispense Refill  . aspirin EC 325 MG EC tablet Take 1 tablet (325 mg total) by mouth daily. 30 tablet 0  . atorvastatin (LIPITOR) 80 MG tablet Take 1 tablet (80 mg total) by mouth daily. 90 tablet 3  . Biotin 5000 MCG CAPS Take 1 capsule  by mouth daily.    . Calcium Carbonate-Vit D-Min (CALCIUM 600+D PLUS MINERALS) 600-400 MG-UNIT TABS Take 1 tablet by mouth daily.      . cholecalciferol (VITAMIN D) 1000 UNITS tablet Take 1,000 Units by mouth daily.    . febuxostat (ULORIC) 40 MG tablet Take 1 tablet by mouth  daily 90 tablet 3  . fish oil-omega-3 fatty acids 1000 MG capsule Take 1 g by mouth daily.      . Glucosamine 500 MG CAPS Take 1 capsule by mouth daily.     Marland Kitchen lisinopril (PRINIVIL,ZESTRIL) 5 MG tablet Take 1 tablet (5 mg total) by mouth daily. (Patient taking differently: Take 5 mg by mouth daily. Pt takes 2 tablets by mouth daily) 30 tablet 1  . Multiple Vitamins-Minerals (CENTRUM SILVER PO) Take 1 tablet by mouth daily.      . NON FORMULARY raisen soaked in gin    . Probiotic Product (PROBIOTIC  DAILY PO) Take 1 tablet by mouth daily.     . saw palmetto 160 MG capsule Take 160 mg by mouth daily.     . Zinc Sulfate (ZINC 15 PO) Take 1 tablet by mouth daily.       No current facility-administered medications for this visit.    Allergies:   Review of patient's allergies indicates no known allergies.    Social History:  The patient  reports that he has never smoked. He has never used smokeless tobacco. He reports that he drinks alcohol. He reports that he does not use illicit drugs.   Family History:  The patient's *family history includes Cancer in his brother, mother, other, and sister; Cancer (age of onset: 37) in his son; Cancer (age of onset: 68) in his brother; Colon cancer in his brother and son; Heart attack in his father and paternal grandfather; Heart disease in his father; Heart disease (age of onset: 25) in his brother; Hypertension in his father and sister; Parkinson's disease in his sister. There is no history of Diabetes.    ROS:  Please see the history of present illness.   Otherwise, review of systems are positive for mild right leg swelling.   All other systems are reviewed and negative.    PHYSICAL EXAM: VS:  BP 110/64 mmHg  Pulse 76  Ht 5' 11.5" (1.816 m)  Wt 162 lb 9.6 oz (73.755 kg)  BMI 22.36 kg/m2  SpO2 99% , BMI Body mass index is 22.36 kg/(m^2). GEN: Well nourished, well developed, in no acute distress HEENT: normal Neck: no JVD, carotid bruits, or masses Cardiac: RRR; no murmurs, rubs, or gallops,no edema  Respiratory:  clear to auscultation bilaterally, normal work of breathing GI: soft, nontender, nondistended, + BS MS: no deformity or atrophy Skin: warm and dry, no rash Neuro:  Strength and sensation are intact Psych: euthymic mood, full affect     Recent Labs: 03/19/2015: Magnesium 2.3 05/23/2015: ALT 15; BUN 15; Creat 1.20; Hemoglobin 13.5; Platelets 222; Potassium 4.1; Sodium 140; TSH 3.327   Lipid Panel    Component Value Date/Time    CHOL 161 05/23/2015 0001   TRIG 92 05/23/2015 0001   HDL 62 05/23/2015 0001   CHOLHDL 2.6 05/23/2015 0001   VLDL 18 05/23/2015 0001   LDLCALC 81 05/23/2015 0001     Other studies Reviewed: Additional studies/ records that were reviewed today with results demonstrating: LE u/s negative for DVT,  in 5/16. Cath results as noted.     ASSESSMENT AND PLAN:  1. Coronary artery  disease: Status post CABG. Doing very well. Continue aggressive secondary prevention and regular exercise.  2. Hyperlipidemia: Tolerating atorvastatin 80 mg well. He was on Vytorin 10/80 in the past but atorvastatin is less expensive. Lipids were well controlled in the hospital. Continue current medicines. 3. Hypertension: Blood pressure well controlled.   Current medicines are reviewed at length with the patient today.  The patient concerns regarding his medicines were addressed.  The following changes have been made:  No change  Labs/ tests ordered today include:  No orders of the defined types were placed in this encounter.    Recommend 150 minutes/week of aerobic exercise Low fat, low carb, high fiber diet recommended  Disposition:   FU in 6 months    Teresita Madura., MD  06/01/2015 9:10 AM    [facility] [place], [place], [place]  [address] Phone: [phone]; Fax: [phone]

## 2015-06-01 NOTE — Patient Instructions (Signed)
Medication Instructions:  Same-no change  Labwork: None  Testing/Procedures: None  Follow-Up: Your physician wants you to follow-up in: 6 months. You will receive a reminder letter in the mail two months in advance. If you don't receive a letter, please call our office to schedule the follow-up appointment.      

## 2015-06-06 ENCOUNTER — Ambulatory Visit: Payer: Medicare Other | Admitting: Interventional Cardiology

## 2015-06-06 ENCOUNTER — Other Ambulatory Visit: Payer: Self-pay

## 2015-06-07 ENCOUNTER — Other Ambulatory Visit: Payer: Self-pay | Admitting: Interventional Cardiology

## 2015-06-07 ENCOUNTER — Other Ambulatory Visit: Payer: Self-pay | Admitting: Family Medicine

## 2015-06-07 NOTE — Telephone Encounter (Signed)
This patient had med check 05/26/15 but it looks as if his meds were not refilled is this okay

## 2015-06-07 NOTE — Telephone Encounter (Signed)
That's because he is no longer on lisinopril HCT.  Med was changed by cardiologist.  Must be auto-refill from pharmacy.  Deny

## 2015-06-13 IMAGING — CR DG CHEST 2V
2 series · 2 of 2 positions shown · non-contrast
Comparison: PA and lateral chest of March 22, 2015

CLINICAL DATA: Status post CABG on March 18, 2015, currently
asymptomatic.

EXAM:
CHEST  2 VIEW

[w chest pa]
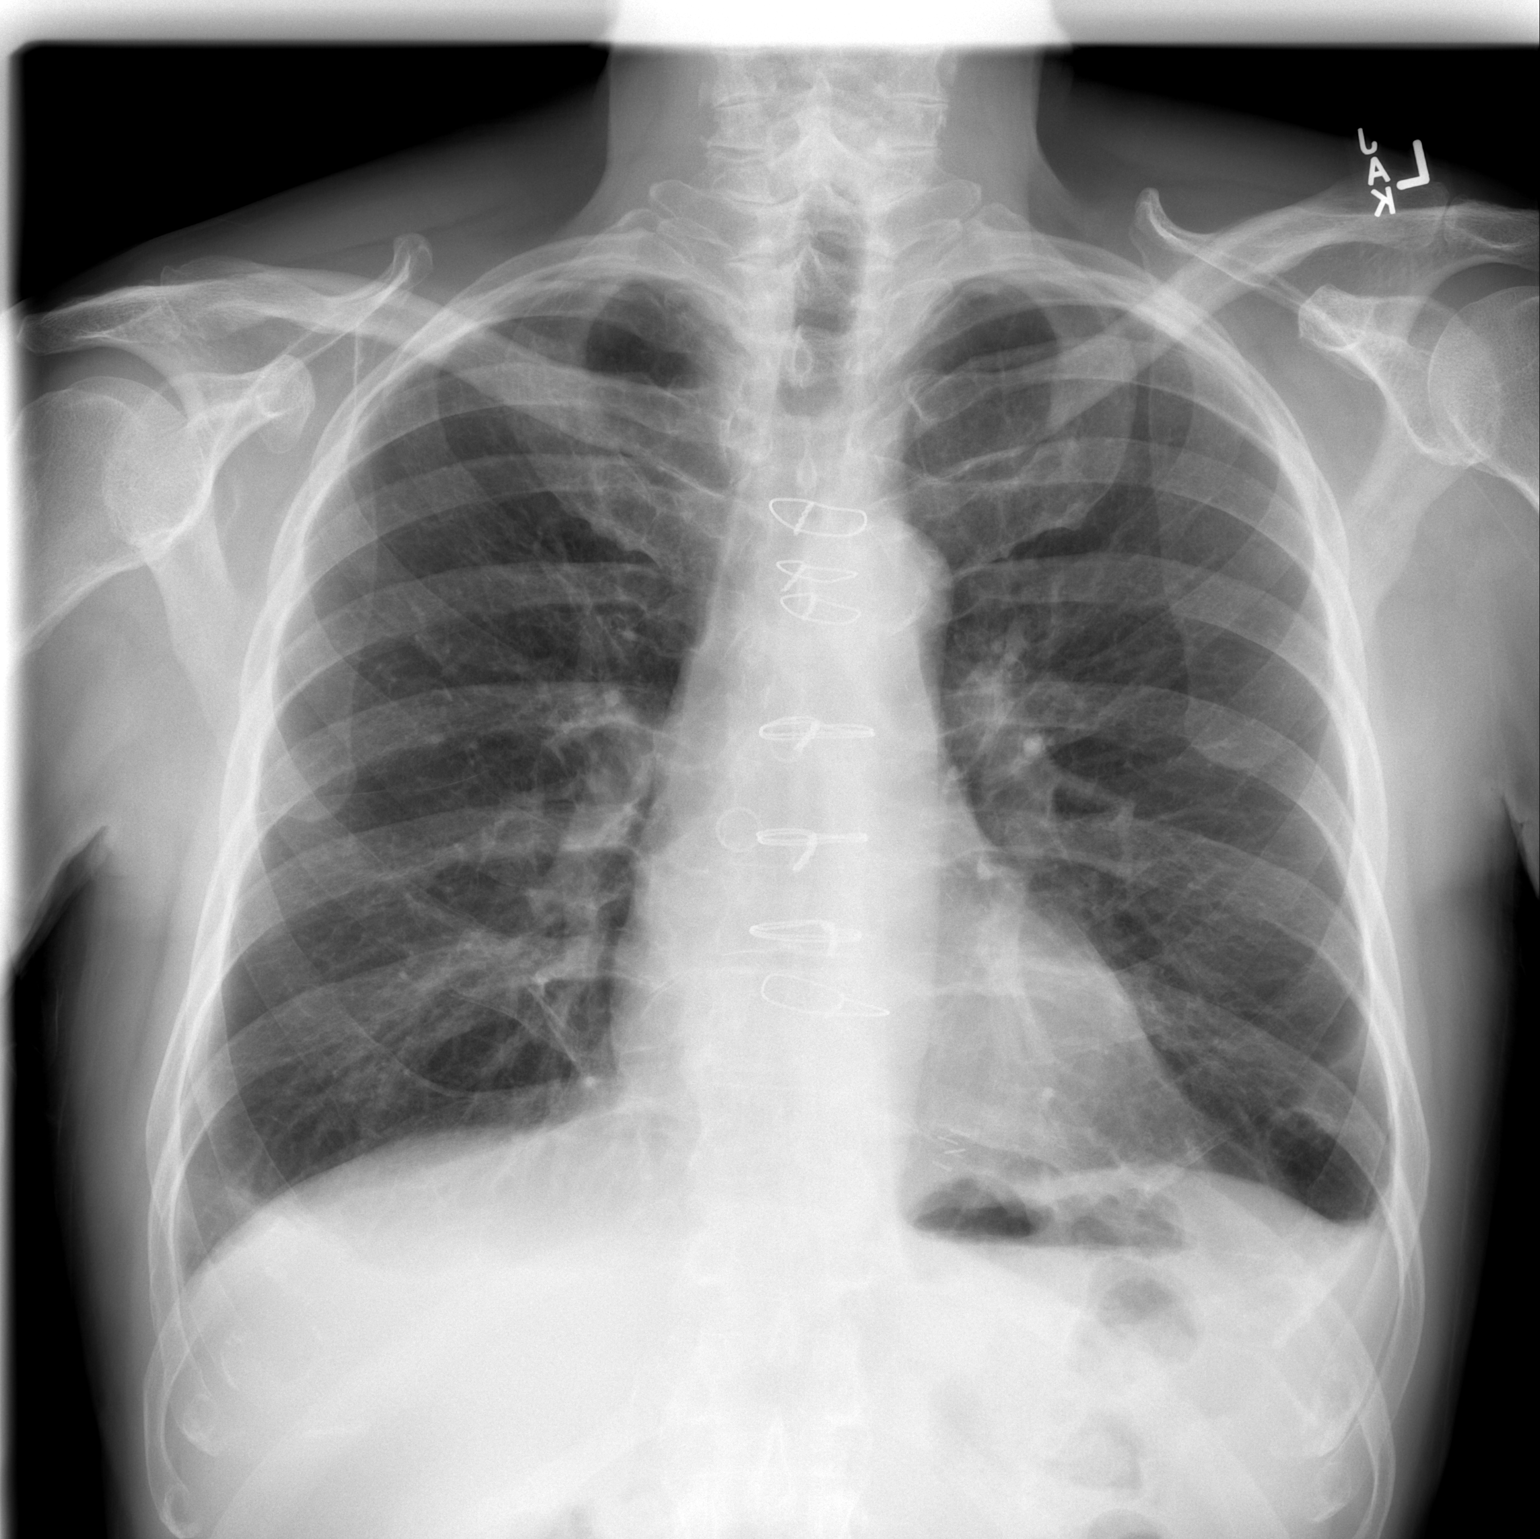

[w chest lat]
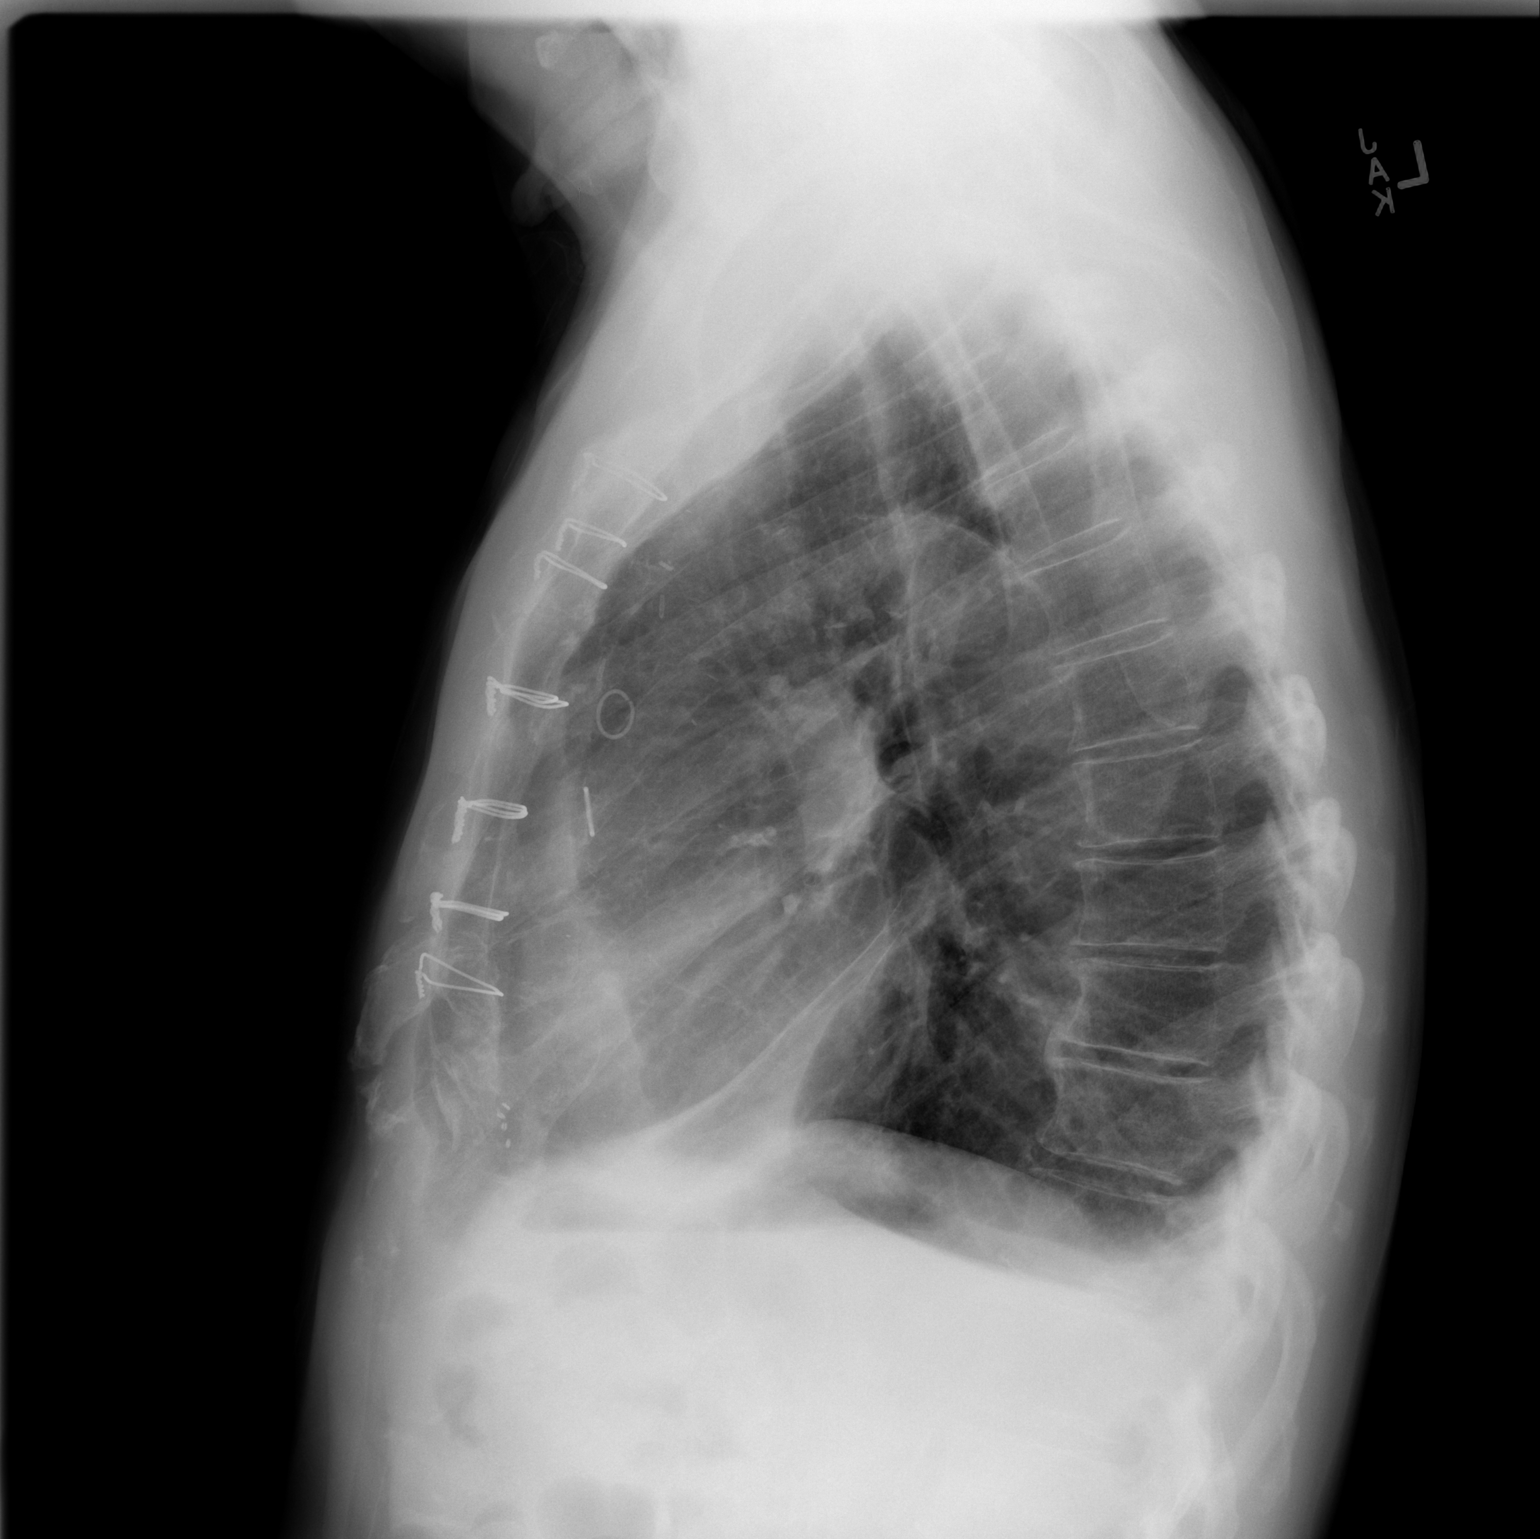

[2 of 2 positions shown; findings below may reference images not displayed]

FINDINGS: The lungs are well-expanded. There has been further interval
decrease in the small left pleural effusion. The pulmonary
interstitium has also cleared. The heart is normal in size. The
pulmonary vascularity is not engorged. The mediastinum is normal in
width. There are 7 intact sternal wires. The thoracic vertebral
bodies are preserved in height.
IMPRESSION: Further interval decrease in the left pleural effusion. The right
pleural effusion has resolved. There is no evidence of pulmonary
edema or other acute cardiopulmonary abnormality.

## 2015-08-17 ENCOUNTER — Other Ambulatory Visit: Payer: Self-pay | Admitting: Family Medicine

## 2015-09-07 DIAGNOSIS — D485 Neoplasm of uncertain behavior of skin: Secondary | ICD-10-CM | POA: Diagnosis not present

## 2015-09-07 DIAGNOSIS — L723 Sebaceous cyst: Secondary | ICD-10-CM | POA: Diagnosis not present

## 2015-09-07 DIAGNOSIS — L570 Actinic keratosis: Secondary | ICD-10-CM | POA: Diagnosis not present

## 2015-09-07 DIAGNOSIS — D225 Melanocytic nevi of trunk: Secondary | ICD-10-CM | POA: Diagnosis not present

## 2015-09-07 DIAGNOSIS — D2230 Melanocytic nevi of unspecified part of face: Secondary | ICD-10-CM | POA: Diagnosis not present

## 2015-10-05 ENCOUNTER — Other Ambulatory Visit: Payer: Self-pay | Admitting: Family Medicine

## 2015-11-05 ENCOUNTER — Other Ambulatory Visit: Payer: Self-pay | Admitting: Interventional Cardiology

## 2015-11-16 ENCOUNTER — Other Ambulatory Visit: Payer: Self-pay | Admitting: Family Medicine

## 2015-12-13 ENCOUNTER — Other Ambulatory Visit: Payer: Medicare Other

## 2015-12-13 DIAGNOSIS — M109 Gout, unspecified: Secondary | ICD-10-CM

## 2015-12-13 DIAGNOSIS — E7800 Pure hypercholesterolemia, unspecified: Secondary | ICD-10-CM

## 2015-12-13 DIAGNOSIS — Z5181 Encounter for therapeutic drug level monitoring: Secondary | ICD-10-CM

## 2015-12-13 LAB — LIPID PANEL
Cholesterol: 174 mg/dL (ref 125–200)
HDL: 56 mg/dL (ref >=40)
LDL Cholesterol: 86 mg/dL (ref <130)
TRIGLYCERIDES: 162 mg/dL — AB (ref <150)
Total CHOL/HDL Ratio: 3.1 Ratio (ref <=5.0)
VLDL: 32 mg/dL — AB (ref <30)

## 2015-12-13 LAB — URIC ACID: Uric Acid, Serum: 4.7 mg/dL (ref 4.0–7.8)

## 2015-12-13 LAB — HEPATIC FUNCTION PANEL
ALBUMIN: 4.1 g/dL (ref 3.6–5.1)
ALT: 19 U/L (ref 9–46)
AST: 21 U/L (ref 10–35)
Alkaline Phosphatase: 62 U/L (ref 40–115)
Bilirubin, Direct: 0.2 mg/dL (ref <=0.2)
Indirect Bilirubin: 0.7 mg/dL (ref 0.2–1.2)
TOTAL PROTEIN: 6.5 g/dL (ref 6.1–8.1)
Total Bilirubin: 0.9 mg/dL (ref 0.2–1.2)

## 2015-12-15 ENCOUNTER — Ambulatory Visit (INDEPENDENT_AMBULATORY_CARE_PROVIDER_SITE_OTHER): Payer: Medicare Other | Admitting: Family Medicine

## 2015-12-15 ENCOUNTER — Encounter: Payer: Self-pay | Admitting: Family Medicine

## 2015-12-15 VITALS — BP 120/70 | HR 68 | Ht 71.5 in | Wt 169.4 lb

## 2015-12-15 DIAGNOSIS — I2510 Atherosclerotic heart disease of native coronary artery without angina pectoris: Secondary | ICD-10-CM | POA: Diagnosis not present

## 2015-12-15 DIAGNOSIS — Z0000 Encounter for general adult medical examination without abnormal findings: Secondary | ICD-10-CM | POA: Diagnosis not present

## 2015-12-15 DIAGNOSIS — E7800 Pure hypercholesterolemia, unspecified: Secondary | ICD-10-CM | POA: Diagnosis not present

## 2015-12-15 DIAGNOSIS — Z125 Encounter for screening for malignant neoplasm of prostate: Secondary | ICD-10-CM | POA: Diagnosis not present

## 2015-12-15 DIAGNOSIS — I10 Essential (primary) hypertension: Secondary | ICD-10-CM | POA: Diagnosis not present

## 2015-12-15 DIAGNOSIS — Z951 Presence of aortocoronary bypass graft: Secondary | ICD-10-CM | POA: Diagnosis not present

## 2015-12-15 DIAGNOSIS — M109 Gout, unspecified: Secondary | ICD-10-CM | POA: Diagnosis not present

## 2015-12-15 LAB — POCT URINALYSIS DIPSTICK
Bilirubin, UA: NEGATIVE
Blood, UA: NEGATIVE
Glucose, UA: NEGATIVE
Ketones, UA: NEGATIVE
LEUKOCYTES UA: NEGATIVE
NITRITE UA: NEGATIVE
PH UA: 5.5
Spec Grav, UA: >=1.03
UROBILINOGEN UA: 0.2

## 2015-12-15 MED ORDER — LISINOPRIL 10 MG PO TABS: 10.0000 mg | ORAL_TABLET | Freq: Every day | ORAL | Status: DC

## 2015-12-15 NOTE — Progress Notes (Signed)
Chief Complaint  Patient presents with  . Annual Exam    nonfasting annual wellness(labs already done). Did not do eye exam he just had one. No concerns.      Bradley Terrell is a 75 y.o. male who presents for physical, annual wellness visit and follow-up on chronic medical conditions.  He has no specific concerns.  His son passed away in 11-28-2023. He and his wife are driving to [facility] tomorrow for his celebration of life at a biker hangout he used to love. He had been on hospice.  Patient is doing well as far as grief. He is not currently working (the job that he was planning to start fell through, as the place was sold).  CAD: He had bypass surgery in April 2016 and is doing well. No longer having any exertional dyspnea. He last saw Dr. Irish Lack in June. No changes were made at that time. Has f/u scheduled in February.  Hyperlipidemia follow-up: Compliant with medications and denies medication side effects.Diet is worse in the Spring (related to baseball season).  Review of chart shows the fluctuations in his lipids seasonally, always higher in April/May than in October/November. His Vytorin was changed to Lipitor '80mg'$  in the Spring of 2015 by Dr. Irish Lack due to cost.  HTN: Blood pressures at home are running 130's-140/70's. Often checks immediately after exercise so that he can see how high he got his pulse; BP is lower after waiting 5 minutes. Denies cough, headaches, dizziness, chest pain, shortness of breath.  Gout: Hasn't had any flares. Remains on uloric. He is no longer on HCTZ.   Inguinal hernias--remain asymptomatic.   Immunization History  Administered Date(s) Administered  . Influenza Split 09/17/2012  . Influenza Whole 08/25/2013  . Influenza, High Dose Seasonal PF 09/23/2014  . Pneumococcal Conjugate-13 03/10/2005, 10/22/2013  . Tdap 01/10/2009  . Zoster 01/10/2005  He got the flu shot at [facility] in October or November Last colonoscopy: 05/2014 with Dr. Amedeo Plenty  (diverticulosis). Last PSA: 05/2015 Dentist: twice yearly  Ophtho: yearly (October/November) Exercise: Yardwork. Walks 30 minutes at least 5x/week.   Other doctors caring for patient include: GI: Dr. Amedeo Plenty Cardiologist: Dr. Irish Lack CT surgeon: Dr. Servando Snare Dentist: Dr. Deanna Artis Ophtho: Canary Brim (male) Dermatologist: Dr. Delman Cheadle   Depression screen:  Negative Fall screen: negative Functional status screen is remarkable only for occasional slight urine leakage (dribbling at end of void). See epic for full questionnaires/screens.  End of Life Discussion:  Patient has a living will and medical power of attorney  Past Medical History  Diagnosis Date  . CAD (coronary artery disease)     a. PCI in 1991 (in [place]); b. LHC 4/16: dLM 95%, severe ostial LAD and ostial CFX disease extending from the LM, severe oRI, pRCA 99%, EF 60% >> CABG  . HTN (hypertension)   . Renal cyst     left  . Gilbert syndrome     elevated indirect bilirubin  . Inguinal hernia     R>L, easily reducible  . HLD (hyperlipidemia)   . PAF (paroxysmal atrial fibrillation) ([place])     post CABG >> converted to NSR with Amiodarone    Past Surgical History  Procedure Laterality Date  . Tonsillectomy  age 24  . Cardiac atherectomy  '91    to LAD and RCA  . Prk  '96    (refractive eye surgery)  . Colonoscopy  03/2009, 05/2014  . Left heart catheterization with coronary angiogram N/A 03/18/2015    Procedure: LEFT  HEART CATHETERIZATION WITH CORONARY ANGIOGRAM;  Surgeon: Jettie Booze, MD;  Location: [facility] CATH LAB;  Service: Cardiovascular;  Laterality: N/A;  . Coronary artery bypass graft N/A 03/18/2015    Procedure: CORONARY ARTERY BYPASS GRAFTING (CABG) x  four, LIMA to LAD, SVG-Ramus-dCx, SVG - dRCA using left internal mammary artery and right leg thigh & calf greater saphenous vein harvested endoscopically;  Surgeon: Grace Isaac, MD;  Location: [place];  Service: Open Heart Surgery;  Laterality: N/A;   . Intraoperative transesophageal echocardiogram N/A 03/18/2015    Procedure: INTRAOPERATIVE TRANSESOPHAGEAL ECHOCARDIOGRAM;  Surgeon: Grace Isaac, MD;  Location: [place];  Service: Open Heart Surgery;  Laterality: N/A;  . Endovein harvest of greater saphenous vein Right 03/18/2015    Procedure: ENDOSCPOIC HARVEST OF RIGHT GREATER SAPHENOUS VEIN;  Surgeon: Grace Isaac, MD;  Location: [place];  Service: Open Heart Surgery;  Laterality: Right;    Social History   Social History  . Marital Status: Married    Spouse Name: N/A  . Number of Children: 5  . Years of Education: N/A   Occupational History  . 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 Smoking status: Never Smoker   . Smokeless tobacco: Never Used  . Alcohol Use: 0.0 oz/week    0 Standard drinks or equivalent per week     Comment: bourbon and ginger daily, wine with dinner, stinger in the evening.  . Drug Use: No  . Sexual Activity: Not on file   Other Topics Concern  . Not on file   Social History Narrative   Married, 1 dog, 1 cat.  Son in [place] (has terminal colon cancer)--passed away Dec 06, 2015, 3 in [place]. 7 grandchildren, 1 great-grandchild   Lost job 07/2014.    Family History  Problem Relation Age of Onset  . Cancer Mother     breast  . Hypertension Father   . Heart disease Father   . Heart attack Father   . Cancer Sister     pancreatic cancer  . Heart disease Brother 55    CABG  . Cancer Brother     colon  . Colon cancer Brother     late 7's  . Cancer Son 30    colon cancer, recurred at 75 (metastatic)  . Colon cancer Son   . Diabetes Neg Hx   . Cancer Brother 10    lung cancer, smoker  . Parkinson's disease Sister   . Hypertension Sister   . Cancer Other     pancreatic  . Heart attack Paternal Grandfather     Outpatient Encounter Prescriptions as of 12/15/2015  Medication Sig Note  . aspirin EC 325 MG EC tablet Take 1 tablet (325 mg total) by mouth daily.   Marland Kitchen atorvastatin  (LIPITOR) 80 MG tablet Take 1 tablet by mouth  daily   . Biotin 5000 MCG CAPS Take 1 capsule by mouth daily.   . Calcium Carbonate-Vit D-Min (CALCIUM 600+D PLUS MINERALS) 600-400 MG-UNIT TABS Take 1 tablet by mouth daily.     . cholecalciferol (VITAMIN D) 1000 UNITS tablet Take 1,000 Units by mouth daily.   . febuxostat (ULORIC) 40 MG tablet Take 1 tablet by mouth  daily   . fish oil-omega-3 fatty acids 1000 MG capsule Take 1 g by mouth daily.     . Glucosamine 500 MG CAPS Take 1 capsule by mouth daily.    Marland Kitchen lisinopril (PRINIVIL,ZESTRIL) 10 MG tablet Take 1 tablet (  10 mg total) by mouth daily.   . Multiple Vitamins-Minerals (CENTRUM SILVER PO) Take 1 tablet by mouth daily.     . NON FORMULARY raisen soaked in gin 05/26/2015: .  Marland Kitchen Probiotic Product (PROBIOTIC DAILY PO) Take 1 tablet by mouth daily.    . saw palmetto 160 MG capsule Take 160 mg by mouth daily.    . Zinc Sulfate (ZINC 15 PO) Take 1 tablet by mouth daily.     . [DISCONTINUED] lisinopril (PRINIVIL,ZESTRIL) 5 MG tablet Take 1 tablet (5 mg total) by mouth daily. (Patient taking differently: Take 5 mg by mouth daily. Pt takes 2 tablets by mouth daily)   . [DISCONTINUED] lisinopril-hydrochlorothiazide (PRINZIDE,ZESTORETIC) 20-12.5 MG tablet Take 2 tablets by mouth  daily    No facility-administered encounter medications on file as of 12/15/2015.    No Known Allergies  ROS: The patient denies anorexia, fever, weight changes, headaches, vision loss, decreased hearing, ear pain, hoarseness, chest pain, palpitations, dizziness, syncope, dyspnea on exertion, cough, swelling, nausea, vomiting, diarrhea, constipation, abdominal pain, melena, hematochezia, indigestion/heartburn, hematuria, erectile dysfunction, nocturia, weakened urine stream, dysuria, genital lesions, joint pains, numbness, tingling, weakness, tremor, suspicious skin lesions, depression, anxiety, abnormal bleeding/bruising, or enlarged lymph nodes. Sees Dr. Elmon Else  yearly. Gained 7# since last visit in June. Occasional dribbling at end of void.   PHYSICAL EXAM:  BP 120/70 mmHg  Pulse 68  Ht 5' 11.5" (1.816 m)  Wt 169 lb 6.4 oz (76.839 kg)  BMI 23.30 kg/m2  General Appearance:  Alert, cooperative, no distress, appears stated age   Head:  Normocephalic, without obvious abnormality, atraumatic   Eyes:  PERRL, conjunctiva/corneas clear, EOM's intact, fundi  benign   Ears:  Normal TM's and external ear canals   Nose:  Nares normal, mucosa normal, no drainage or sinus tenderness   Throat:  Lips, mucosa, and tongue normal; teeth and gums normal   Neck:  Supple, no lymphadenopathy; thyroid: no enlargement/tenderness/nodules; no carotid  bruit or JVD   Back:  Spine nontender, no curvature, ROM normal, no CVA tenderness   Lungs:  Clear to auscultation bilaterally without wheezes, rales or ronchi; respirations unlabored   Chest Wall:  No tenderness or deformity   Heart:  Regular rate and rhythm, S1 and S2 normal, no murmur, rub  or gallop   Breast Exam:  No chest wall tenderness, masses or gynecomastia   Abdomen:  Soft, non-tender, nondistended, normoactive bowel sounds,  no masses, no hepatosplenomegaly   Genitalia:  Normal male external genitalia without lesions. Testicles without masses. R>L inguinal hernias present, easily reducible, nontender   Rectal:  Normal sphincter tone, no masses or tenderness; guaiac negative stool. Prostate somewhat asymmetric, right lobe slightly larger than left; no nodules.   Extremities:  No clubbing, cyanosis or edema. Thickened onychomycotic toenails   Pulses:  2+ and symmetric all extremities   Skin:  Skin color, texture, turgor normal, no rashes or lesions. Actinic skin changes throughout  Lymph nodes:  Cervical, supraclavicular, and axillary nodes normal   Neurologic:  CNII-XII intact, normal strength, sensation and gait; reflexes 2+  and symmetric throughout    Psych: Normal mood, affect, hygiene and grooming       Urine dip: SG >1.030; trace protein   Lab Results  Component Value Date   CHOL 174 12/13/2015   HDL 56 12/13/2015   LDLCALC 86 12/13/2015   TRIG 162* 12/13/2015   CHOLHDL 3.1 12/13/2015   Lab Results  Component Value Date   ALT 19  12/13/2015   AST 21 12/13/2015   ALKPHOS 62 12/13/2015   BILITOT 0.9 12/13/2015   Uric acid 4.7   ASSESSMENT/PLAN:  Annual physical exam - Plan: POCT Urinalysis Dipstick, CBC with Differential/Platelet, PSA, Medicare, TSH, Comprehensive metabolic panel, Lipid panel  Gout without tophus, unspecified cause, unspecified chronicity, unspecified site - no flares in years; no longer on HCTZ; prefers to continue on uloric  Pure hypercholesterolemia - Will forward results to cardiologist--?if needs adjustment to get to goal LDL<70, vs continue to monitor - Plan: Comprehensive metabolic panel, Lipid panel  Essential hypertension, benign - controlled - Plan: lisinopril (PRINIVIL,ZESTRIL) 10 MG tablet, Comprehensive metabolic panel  S/P CABG (coronary artery bypass graft) - stable, asymptomatic  Coronary artery disease involving native coronary artery of native heart without angina pectoris - stable  Prostate cancer screening - Plan: PSA, Medicare  Encounter for Medicare annual wellness exam   Discussed PSA screening (risks/benefits), due again in 6 mos (prefers to continue monitoring), recommended at least 30 minutes of aerobic activity at least 5 days/week, weight-bearing exercise at least 2x/wk; proper sunscreen use reviewed; healthy diet and alcohol recommendations (less than or equal to 2 drinks/day) reviewed; regular seatbelt use; changing batteries in smoke detectors. Self-testicular exams. Immunization recommendations discussed, UTD. Colonoscopy recommendations reviewed, UTD  F/u 6 months Labs prior: c-met, lipid, PSA, CBC, TSH  Send  copies to Dr. Irish Lack--?LDL okay, or need <70  Low uric acid.  Offered other options--trial off uloric, vs lower dose.  He feels 'if it ain't broke, don't fix it" and prefers to continue with uloric. Check uric acid just once yearly.  Advised to get [place] copies of Living Will and healthcare POA; given add'l forms, if needed. MOST form discussed and reviewed.  Full Code, Full Care.  Discussed grief counseling and hospice services.  Medicare Attestation I have personally reviewed: The patient's medical and social history Their use of alcohol, tobacco or illicit drugs Their current medications and supplements The patient's functional ability including ADLs,fall risks, home safety risks, cognitive, and hearing and visual impairment Diet and physical activities Evidence for depression or mood disorders  The patient's weight, height, and BMI have been recorded in the chart.  I have made referrals, counseling, and provided education to the patient based on review of the above and I have provided the patient with a written personalized care plan for preventive services.     Linh Hedberg A, MD   12/15/2015

## 2015-12-15 NOTE — Patient Instructions (Signed)
  HEALTH MAINTENANCE RECOMMENDATIONS:  It is recommended that you get at least 30 minutes of aerobic exercise at least 5 days/week (for weight loss, you may need as much as 60-90 minutes). This can be any activity that gets your heart rate up. This can be divided in 10-15 minute intervals if needed, but try and build up your endurance at least once a week.  Weight bearing exercise is also recommended twice weekly.  Eat a healthy diet with lots of vegetables, fruits and fiber.  "Colorful" foods have a lot of vitamins (ie green vegetables, tomatoes, red peppers, etc).  Limit sweet tea, regular sodas and alcoholic beverages, all of which has a lot of calories and sugar.  Up to 2 alcoholic drinks daily may be beneficial for men (unless trying to lose weight, watch sugars).  Drink a lot of water.  Sunscreen of at least SPF 30 should be used on all sun-exposed parts of the skin when outside between the hours of 10 am and 4 pm (not just when at beach or pool, but even with exercise, golf, tennis, and yard work!)  Use a sunscreen that says "broad spectrum" so it covers both UVA and UVB rays, and make sure to reapply every 1-2 hours.  Remember to change the batteries in your smoke detectors when changing your clock times in the spring and fall.  Use your seat belt every time you are in a car, and please drive safely and not be distracted with cell phones and texting while driving.    Bradley Terrell , Thank you for taking time to come for your Medicare Wellness Visit. I appreciate your ongoing commitment to your health goals. Please review the following plan we discussed and let me know if I can assist you in the future.   These are the goals we discussed: Goals    None      This is a list of the screening recommended for you and due dates:  Health Maintenance  Topic Date Due  . Pneumonia vaccines (2 of 2 - PPSV23) 10/22/2014  . Flu Shot  07/11/2015  . Tetanus Vaccine  01/10/2019  . Shingles Vaccine   Completed    You have had both types of pneumonia vaccines, nothing is due. We will correct it in the computer. You did get your flu shot at [facility].  I recommend getting the high dose flu shot each year (and let us know when you get it).

## 2015-12-26 ENCOUNTER — Other Ambulatory Visit: Payer: Self-pay | Admitting: Interventional Cardiology

## 2016-01-19 NOTE — Progress Notes (Signed)
Patient Bradley Terrell: Bradley Terrell, male   DOB: Jul 24, 1941, 75 y.o.   MRN: [id number]     Cardiology Office Note   Date:  01/23/2016   Bradley Terrell:  Bradley Terrell, DOB Jan 10, 1941, MRN [id number]  PCP:  Vikki Ports, MD    No chief complaint on file. CAD   Wt Readings from Last 3 Encounters:  01/23/16 170 lb (77.111 kg)  12/15/15 169 lb 6.4 oz (76.839 kg)  06/01/15 162 lb 9.6 oz (73.755 kg)       History of Present Illness: Bradley Terrell is a 75 y.o. male  who had CABG in April 2016. He had severe 3 vessel disease and left main disease. HIs CABG was done urgently. No CP or SHOB. He never did cardiac rehabilitation because he was very active on his own.   He is back to attending [employer] games regularly. Overall, he feels back to normal.   He has returned to regular exercise.  He walks twice a day.  He does yard work.  No balance problems.  Overall, he feels great.  No bleeding problems.    Past Medical History  Diagnosis Date  . CAD (coronary artery disease)     a. PCI in 1991 (in [place]); b. LHC 4/16: dLM 95%, severe ostial LAD and ostial CFX disease extending from the LM, severe oRI, pRCA 99%, EF 60% >> CABG  . HTN (hypertension)   . Renal cyst     left  . Gilbert syndrome     elevated indirect bilirubin  . Inguinal hernia     R>L, easily reducible  . HLD (hyperlipidemia)   . PAF (paroxysmal atrial fibrillation) ([place])     post CABG >> converted to NSR with Amiodarone    Past Surgical History  Procedure Laterality Date  . Tonsillectomy  age 57  . Cardiac atherectomy  '91    to LAD and RCA  . Prk  '96    (refractive eye surgery)  . Colonoscopy  03/2009, 05/2014  . Left heart catheterization with coronary angiogram N/A 03/18/2015    Procedure: LEFT HEART CATHETERIZATION WITH CORONARY ANGIOGRAM;  Surgeon: Jettie Booze, MD;  Location: [facility] CATH LAB;  Service: Cardiovascular;  Laterality: N/A;  . Coronary artery bypass graft N/A 03/18/2015    Procedure: CORONARY ARTERY  BYPASS GRAFTING (CABG) x  four, LIMA to LAD, SVG-Ramus-dCx, SVG - dRCA using left internal mammary artery and right leg thigh & calf greater saphenous vein harvested endoscopically;  Surgeon: Grace Isaac, MD;  Location: [place];  Service: Open Heart Surgery;  Laterality: N/A;  . Intraoperative transesophageal echocardiogram N/A 03/18/2015    Procedure: INTRAOPERATIVE TRANSESOPHAGEAL ECHOCARDIOGRAM;  Surgeon: Grace Isaac, MD;  Location: [place];  Service: Open Heart Surgery;  Laterality: N/A;  . Endovein harvest of greater saphenous vein Right 03/18/2015    Procedure: ENDOSCPOIC HARVEST OF RIGHT GREATER SAPHENOUS VEIN;  Surgeon: Grace Isaac, MD;  Location: [place];  Service: Open Heart Surgery;  Laterality: Right;     Current Outpatient Prescriptions  Medication Sig Dispense Refill  . aspirin EC 325 MG EC tablet Take 1 tablet (325 mg total) by mouth daily. 30 tablet 0  . atorvastatin (LIPITOR) 80 MG tablet Take 1 tablet by mouth  daily 90 tablet 0  . Biotin 5000 MCG CAPS Take 1 capsule by mouth daily.    . Calcium Carbonate-Vit D-Min (CALCIUM 600+D PLUS MINERALS) 600-400 MG-UNIT TABS Take 1 tablet by mouth daily.      Marland Kitchen  cholecalciferol (VITAMIN D) 1000 UNITS tablet Take 1,000 Units by mouth daily.    . febuxostat (ULORIC) 40 MG tablet Take 1 tablet by mouth  daily 90 tablet 3  . fish oil-omega-3 fatty acids 1000 MG capsule Take 1 g by mouth daily.      . Glucosamine 500 MG CAPS Take 1 capsule by mouth daily.     Marland Kitchen lisinopril (PRINIVIL,ZESTRIL) 10 MG tablet Take 1 tablet (10 mg total) by mouth daily. 90 tablet 1  . Multiple Vitamins-Minerals (CENTRUM SILVER PO) Take 1 tablet by mouth daily.      . NON FORMULARY raisIns soaked in gin    . Probiotic Product (PROBIOTIC DAILY PO) Take 1 tablet by mouth daily.     . saw palmetto 160 MG capsule Take 160 mg by mouth daily.     . Zinc Sulfate (ZINC 15 PO) Take 1 tablet by mouth daily.       No current facility-administered medications for this  visit.    Allergies:   Review of patient's allergies indicates no known allergies.    Social History:  The patient  reports that he has never smoked. He has never used smokeless tobacco. He reports that he drinks alcohol. He reports that he does not use illicit drugs.   Family History:  The patient's family history includes Cancer in his brother, mother, other, and sister; Cancer (age of onset: 24) in his son; Cancer (age of onset: 63) in his brother; Colon cancer in his brother and son; Heart attack in his father and paternal grandfather; Heart disease in his father; Heart disease (age of onset: 19) in his brother; Hypertension in his father and sister; Parkinson's disease in his sister. There is no history of Diabetes.    ROS:  Please see the history of present illness.   Otherwise, review of systems are positive for feeling great. " I feel like I'm 19.".   All other systems are reviewed and negative.    PHYSICAL EXAM: VS:  BP 123/80 mmHg  Pulse 76  Ht 5' 11.5" (1.816 m)  Wt 170 lb (77.111 kg)  BMI 23.38 kg/m2 , BMI Body mass index is 23.38 kg/(m^2). GEN: Well nourished, well developed, in no acute distress HEENT: normal Neck: no JVD, carotid bruits, or masses Cardiac: RRR; no murmurs, rubs, or gallops,no edema  Respiratory:  clear to auscultation bilaterally, normal work of breathing GI: soft, nontender, nondistended, + BS MS: no deformity or atrophy Skin: warm and dry, no rash Neuro:  Strength and sensation are intact Psych: euthymic mood, full affect   EKG:   The ekg ordered today demonstrates Normal   Recent Labs: 03/19/2015: Magnesium 2.3 05/23/2015: BUN 15; Creat 1.20; Hemoglobin 13.5; Platelets 222; Potassium 4.1; Sodium 140; TSH 3.327 12/13/2015: ALT 19   Lipid Panel    Component Value Date/Time   CHOL 174 12/13/2015 0001   TRIG 162* 12/13/2015 0001   HDL 56 12/13/2015 0001   CHOLHDL 3.1 12/13/2015 0001   VLDL 32* 12/13/2015 0001   LDLCALC 86 12/13/2015 0001      Other studies Reviewed: Additional studies/ records that were reviewed today with results demonstrating: cath report with severe left main diseaes.   ASSESSMENT AND PLAN:  1. Coronary artery disease: Status post CABG. Doing very well. Continue aggressive secondary prevention and regular exercise. Decrease aspirin to 81 mg daily. 2. Hyperlipidemia:  Continue current medicines.Tolerating atorvastatin 80 mg well and it is effective. Stopped Vytorin 10/80 in the past because atorvastatin is  less expensive. Lipids were well controlled in Jan 2017.  He eats a healthy diet.  He avoids red meat for the most part.  TG slightly increased.  Continue eating a healthy diet and regular exercise.  WOuld not add any meds at this time.   3. Hypertension: Blood pressure well controlled.  COntinue lisinopril. COntinue regular exercise. 4. Renal cyst: diagnosed in 2005.  No AAA at that time.   Current medicines are reviewed at length with the patient today.  The patient concerns regarding his medicines were addressed.  The following changes have been made:  No change  Labs/ tests ordered today include:  No orders of the defined types were placed in this encounter.    Recommend 150 minutes/week of aerobic exercise Low fat, low carb, high fiber diet recommended  Disposition:   FU in 1 year   Teresita Madura., MD  01/23/2016 8:44 AM    [facility] [place], [place], [place]  [address] Phone: [phone]; Fax: [phone]

## 2016-01-23 ENCOUNTER — Encounter: Payer: Self-pay | Admitting: Interventional Cardiology

## 2016-01-23 ENCOUNTER — Ambulatory Visit (INDEPENDENT_AMBULATORY_CARE_PROVIDER_SITE_OTHER): Payer: Medicare Other | Admitting: Interventional Cardiology

## 2016-01-23 VITALS — BP 123/80 | HR 76 | Ht 71.5 in | Wt 170.0 lb

## 2016-01-23 DIAGNOSIS — Z951 Presence of aortocoronary bypass graft: Secondary | ICD-10-CM

## 2016-01-23 DIAGNOSIS — I2510 Atherosclerotic heart disease of native coronary artery without angina pectoris: Secondary | ICD-10-CM

## 2016-01-23 DIAGNOSIS — Q6100 Congenital renal cyst, unspecified: Secondary | ICD-10-CM

## 2016-01-23 DIAGNOSIS — E7800 Pure hypercholesterolemia, unspecified: Secondary | ICD-10-CM | POA: Diagnosis not present

## 2016-01-23 DIAGNOSIS — I10 Essential (primary) hypertension: Secondary | ICD-10-CM

## 2016-01-23 DIAGNOSIS — N281 Cyst of kidney, acquired: Secondary | ICD-10-CM

## 2016-01-23 MED ORDER — ASPIRIN 81 MG PO TBEC
81.0000 mg | DELAYED_RELEASE_TABLET | Freq: Every day | ORAL | Status: DC
Start: 2016-01-23 — End: 2016-08-08

## 2016-01-23 NOTE — Patient Instructions (Addendum)
Medication Instructions:  Decrease Aspirin to 81 mg daily-all other medications remain the same.  Labwork: Lipids and LFTs in Jan. 2018  Testing/Procedures: None  Follow-Up: Your physician wants you to follow-up in: 1 year. You will receive a reminder letter in the mail two months in advance. If you don't receive a letter, please call our office to schedule the follow-up appointment.     If you need a refill on your cardiac medications before your next appointment, please call your pharmacy.

## 2016-05-15 ENCOUNTER — Other Ambulatory Visit: Payer: Self-pay | Admitting: Interventional Cardiology

## 2016-05-15 ENCOUNTER — Other Ambulatory Visit: Payer: Self-pay | Admitting: Family Medicine

## 2016-05-15 NOTE — Telephone Encounter (Signed)
Pt has an appt in july

## 2016-06-11 ENCOUNTER — Other Ambulatory Visit: Payer: Medicare Other

## 2016-06-11 DIAGNOSIS — I10 Essential (primary) hypertension: Secondary | ICD-10-CM

## 2016-06-11 DIAGNOSIS — Z125 Encounter for screening for malignant neoplasm of prostate: Secondary | ICD-10-CM | POA: Diagnosis not present

## 2016-06-11 DIAGNOSIS — E7800 Pure hypercholesterolemia, unspecified: Secondary | ICD-10-CM | POA: Diagnosis not present

## 2016-06-11 DIAGNOSIS — Z0000 Encounter for general adult medical examination without abnormal findings: Secondary | ICD-10-CM

## 2016-06-11 LAB — CBC WITH DIFFERENTIAL/PLATELET
BASOS PCT: 0 %
Basophils Absolute: 0 cells/uL (ref 0–200)
Eosinophils Absolute: 132 cells/uL (ref 15–500)
Eosinophils Relative: 3 %
HEMATOCRIT: 44.4 % (ref 38.5–50.0)
Hemoglobin: 15.1 g/dL (ref 13.2–17.1)
LYMPHS PCT: 26 %
Lymphs Abs: 1144 cells/uL (ref 850–3900)
MCH: 32.5 pg (ref 27.0–33.0)
MCHC: 34 g/dL (ref 32.0–36.0)
MCV: 95.7 fL (ref 80.0–100.0)
MONO ABS: 660 {cells}/uL (ref 200–950)
MPV: 9.9 fL (ref 7.5–12.5)
Monocytes Relative: 15 %
NEUTROS ABS: 2464 {cells}/uL (ref 1500–7800)
Neutrophils Relative %: 56 %
PLATELETS: 170 10*3/uL (ref 140–400)
RBC: 4.64 MIL/uL (ref 4.20–5.80)
RDW: 13.6 % (ref 11.0–15.0)
WBC: 4.4 10*3/uL (ref 4.0–10.5)

## 2016-06-11 LAB — COMPREHENSIVE METABOLIC PANEL
ALBUMIN: 4 g/dL (ref 3.6–5.1)
ALK PHOS: 58 U/L (ref 40–115)
ALT: 18 U/L (ref 9–46)
AST: 22 U/L (ref 10–35)
BILIRUBIN TOTAL: 1.3 mg/dL — AB (ref 0.2–1.2)
BUN: 12 mg/dL (ref 7–25)
CALCIUM: 8.9 mg/dL (ref 8.6–10.3)
CO2: 27 mmol/L (ref 20–31)
CREATININE: 1.06 mg/dL (ref 0.70–1.18)
Chloride: 103 mmol/L (ref 98–110)
GLUCOSE: 104 mg/dL — AB (ref 65–99)
Potassium: 4 mmol/L (ref 3.5–5.3)
SODIUM: 140 mmol/L (ref 135–146)
Total Protein: 6.4 g/dL (ref 6.1–8.1)

## 2016-06-11 LAB — LIPID PANEL
Cholesterol: 147 mg/dL (ref 125–200)
HDL: 61 mg/dL (ref >=40)
LDL Cholesterol: 70 mg/dL (ref <130)
Total CHOL/HDL Ratio: 2.4 Ratio (ref <=5.0)
Triglycerides: 82 mg/dL (ref <150)
VLDL: 16 mg/dL (ref <30)

## 2016-06-11 LAB — TSH: TSH: 3.49 mIU/L (ref 0.40–4.50)

## 2016-06-12 LAB — PSA, MEDICARE: PSA: 0.55 ng/mL (ref <=4.00)

## 2016-06-13 ENCOUNTER — Ambulatory Visit (INDEPENDENT_AMBULATORY_CARE_PROVIDER_SITE_OTHER): Payer: Medicare Other | Admitting: Family Medicine

## 2016-06-13 ENCOUNTER — Encounter: Payer: Self-pay | Admitting: Family Medicine

## 2016-06-13 VITALS — BP 160/90 | HR 64 | Ht 71.5 in | Wt 171.8 lb

## 2016-06-13 DIAGNOSIS — E7800 Pure hypercholesterolemia, unspecified: Secondary | ICD-10-CM | POA: Diagnosis not present

## 2016-06-13 DIAGNOSIS — I2510 Atherosclerotic heart disease of native coronary artery without angina pectoris: Secondary | ICD-10-CM | POA: Diagnosis not present

## 2016-06-13 DIAGNOSIS — Z5181 Encounter for therapeutic drug level monitoring: Secondary | ICD-10-CM

## 2016-06-13 DIAGNOSIS — I10 Essential (primary) hypertension: Secondary | ICD-10-CM

## 2016-06-13 DIAGNOSIS — M109 Gout, unspecified: Secondary | ICD-10-CM

## 2016-06-13 DIAGNOSIS — R7301 Impaired fasting glucose: Secondary | ICD-10-CM

## 2016-06-13 LAB — POCT GLYCOSYLATED HEMOGLOBIN (HGB A1C): Hemoglobin A1C: 5.1

## 2016-06-13 MED ORDER — FEBUXOSTAT 40 MG PO TABS: ORAL_TABLET | ORAL | Status: DC

## 2016-06-13 NOTE — Progress Notes (Signed)
Chief Complaint  Patient presents with  . Hypertension    nonfasting med check .   "If I were any happier, there would have to be two of me!"  HTN: Blood pressures at home are running 150/105 immediately after a very brisk walk. He checks BP immediately upon getting home, and rechecks it every 2 minutes x 4 checks.  At about  3-4 minutes after exercise he reports that his BP is back to <140/<90. He can't recall the numbers to say what it is usually running, just that it is "okay per my machine"  He didn't bring the list today.  He records this in the computer (downloads), but he didn't bring the list today.  Denies cough, headaches, dizziness, chest pain, shortness of breath.  CAD: He had bypass surgery in April 2016 and is doing well. No longer having any exertional dyspnea. He last saw Dr. Irish Lack in February. No changes were made at that time. Has f/u scheduled in January.  Hyperlipidemia follow-up: Compliant with medications and denies medication side effects.Diet previously was worse in the Spring (related to baseball season). We previously had noted fluctuations in his lipids seasonally, always higher in April/May than in October/November. He no longer eats very much at the ballpark, just some peanuts or occasional bag of chips. His Vytorin was changed to Lipitor '80mg'$  in the Spring of 2015 by Dr. Irish Lack due to cost.He denies any side effects.  Gout: Hasn't had any flares. Remains on uloric. He is no longer on HCTZ.   PMH, PSH, SH reviewed; denies changes to [facility]  Outpatient Encounter Prescriptions as of 06/13/2016  Medication Sig Note  . aspirin 81 MG EC tablet Take 1 tablet (81 mg total) by mouth daily.   Marland Kitchen atorvastatin (LIPITOR) 80 MG tablet Take 1 tablet by mouth  daily   . Biotin 5000 MCG CAPS Take 1 capsule by mouth daily.   . Calcium Carbonate-Vit D-Min (CALCIUM 600+D PLUS MINERALS) 600-400 MG-UNIT TABS Take 1 tablet by mouth daily.     . cholecalciferol (VITAMIN D) 1000  UNITS tablet Take 1,000 Units by mouth daily.   . febuxostat (ULORIC) 40 MG tablet Take 1 tablet by mouth  daily   . fish oil-omega-3 fatty acids 1000 MG capsule Take 1 g by mouth daily.     . Glucosamine 500 MG CAPS Take 1 capsule by mouth daily.    Marland Kitchen lisinopril (PRINIVIL,ZESTRIL) 10 MG tablet Take 1 tablet by mouth  daily   . Multiple Vitamins-Minerals (CENTRUM SILVER PO) Take 1 tablet by mouth daily.     . NON FORMULARY raisIns soaked in gin 05/26/2015: .  Marland Kitchen Probiotic Product (PROBIOTIC DAILY PO) Take 1 tablet by mouth daily.    . saw palmetto 160 MG capsule Take 160 mg by mouth daily.    . Zinc Sulfate (ZINC 15 PO) Take 1 tablet by mouth daily.     . [DISCONTINUED] febuxostat (ULORIC) 40 MG tablet Take 1 tablet by mouth  daily    No facility-administered encounter medications on file as of 06/13/2016.   No Known Allergies  ROS: no fever, chills, headaches, dizziness, anorexia, weight changes, edema, chest pain, palpitations, nausea, vomiting, heartburn, abdominal pain, bowel changes, urinary complaints, bleeding, bruising, rash, depression, joint pains or any other concerns.  PHYSICAL EXAM: BP 160/90 mmHg  Pulse 64  Ht 5' 11.5" (1.816 m)  Wt 171 lb 12.8 oz (77.928 kg)  BMI 23.63 kg/m2  Well developed, pleasant, thin, suntanned male in good spirits HEENT:  PERRL, EOMI, conjunctiva clear. OP normal Neck: no lymphadenopathy, thyromegaly or carotid bruit Heart: regular rate and rhythm, no murmur Lungs: clear bilaterally Back: no CVA or spinal tenderness Abdomen: soft, nontender, no organomegaly or mass Extremities: no clubbing, cyanosis or edema;  2+ pulses Skin: no rashes/lesions; very tanned Neuro: alert and oriented, cranial nerves grossly intact; normal strength, gait Psych: normal mood, affect, hygiene and grooming  Lab Results  Component Value Date   HGBA1C 5.1 06/13/2016    Lab Results  Component Value Date   WBC 4.4 06/11/2016   HGB 15.1 06/11/2016   HCT 44.4  06/11/2016   MCV 95.7 06/11/2016   PLT 170 06/11/2016   Lab Results  Component Value Date   TSH 3.49 06/11/2016   Lab Results  Component Value Date   CHOL 147 06/11/2016   HDL 61 06/11/2016   LDLCALC 70 06/11/2016   TRIG 82 06/11/2016   CHOLHDL 2.4 06/11/2016     Chemistry      Component Value Date/Time   NA 140 06/11/2016 0723   K 4.0 06/11/2016 0723   CL 103 06/11/2016 0723   CO2 27 06/11/2016 0723   BUN 12 06/11/2016 0723   CREATININE 1.06 06/11/2016 0723   CREATININE 1.16 03/22/2015 0333      Component Value Date/Time   CALCIUM 8.9 06/11/2016 0723   ALKPHOS 58 06/11/2016 0723   AST 22 06/11/2016 0723   ALT 18 06/11/2016 0723   BILITOT 1.3* 06/11/2016 0723     Fasting glucose 104  Lab Results  Component Value Date   PSA 0.55 06/11/2016   PSA 0.61 05/23/2015   PSA 0.57 04/15/2014   ASSESSMENT/PLAN:   Essential hypertension, benign - reportedly normal at home; elevated today.  Patient will send [place] records from home. Low sodium diet recommended. (currently eating salted nuts)  Pure hypercholesterolemia - at goal; continue Lipitor  Coronary artery disease involving native coronary artery of native heart without angina pectoris - stable; asymptomatic  Gout without tophus, unspecified cause, unspecified chronicity, unspecified site - no flares; continue uloric (declines trial off, since no longer on HCTZ) - Plan: febuxostat (ULORIC) 40 MG tablet  Impaired fasting glucose - fasting glucose 104; A1c normal. Continue to avoid sweets, limit carbs, and get regular exercise - Plan: HgB A1c   AWV/CPE/med check in 6 months Fasting labs prior--uric acid, c-met, lipid, A1c

## 2016-06-13 NOTE — Patient Instructions (Addendum)
Continue your current medications. Your lipids were excellent. Blood pressure was a little high in the office. Be sure to continue to monitor it at home, especially when NOT just after exercise.  Periodically check it in the morning or evening, NOT related to exertion.  Goal is <130/80 (<135/85 is okay). Try and limit the sodium (salt) in your diet, including chips and salted nuts.  See handout below.  Your Hemoglobin A1c (looking at last 90 days of blood sugars) was normal, so the slightly elevated sugar from the other day is not likely significant.  Continue to follow a low fat diet, and avoid excess of sweets and carbs.  Low-Sodium Eating Plan Sodium raises blood pressure and causes water to be held in the body. Getting less sodium from food will help lower your blood pressure, reduce any swelling, and protect your heart, liver, and kidneys. We get sodium by adding salt (sodium chloride) to food. Most of our sodium comes from canned, boxed, and frozen foods. Restaurant foods, fast foods, and pizza are also very high in sodium. Even if you take medicine to lower your blood pressure or to reduce fluid in your body, getting less sodium from your food is important. WHAT IS MY PLAN? Most people should limit their sodium intake to 2,300 mg a day. Your health care provider recommends that you limit your sodium intake to __________ a day.  WHAT DO I NEED TO KNOW ABOUT THIS EATING PLAN? For the low-sodium eating plan, you will follow these general guidelines:  Choose foods with a % Daily Value for sodium of less than 5% (as listed on the food label).   Use salt-free seasonings or herbs instead of table salt or sea salt.   Check with your health care provider or pharmacist before using salt substitutes.   Eat fresh foods.  Eat more vegetables and fruits.  Limit canned vegetables. If you do use them, rinse them well to decrease the sodium.   Limit cheese to 1 oz (28 g) per day.   Eat  lower-sodium products, often labeled as "lower sodium" or "no salt added."  Avoid foods that contain monosodium glutamate (MSG). MSG is sometimes added to [place] food and some canned foods.  Check food labels (Nutrition Facts labels) on foods to learn how much sodium is in one serving.  Eat more home-cooked food and less restaurant, buffet, and fast food.  When eating at a restaurant, ask that your food be prepared with less salt, or no salt if possible.  HOW DO I READ FOOD LABELS FOR SODIUM INFORMATION? The Nutrition Facts label lists the amount of sodium in one serving of the food. If you eat more than one serving, you must multiply the listed amount of sodium by the number of servings. Food labels may also identify foods as:  Sodium free--Less than 5 mg in a serving.  Very low sodium--35 mg or less in a serving.  Low sodium--140 mg or less in a serving.  Light in sodium--50% less sodium in a serving. For example, if a food that usually has 300 mg of sodium is changed to become light in sodium, it will have 150 mg of sodium.  Reduced sodium--25% less sodium in a serving. For example, if a food that usually has 400 mg of sodium is changed to reduced sodium, it will have 300 mg of sodium. WHAT FOODS CAN I EAT? Grains Low-sodium cereals, including oats, puffed wheat and rice, and shredded wheat cereals. Low-sodium crackers. Unsalted rice  and pasta. Lower-sodium bread.  Vegetables Frozen or fresh vegetables. Low-sodium or reduced-sodium canned vegetables. Low-sodium or reduced-sodium tomato sauce and paste. Low-sodium or reduced-sodium tomato and vegetable juices.  Fruits Fresh, frozen, and canned fruit. Fruit juice.  Meat and Other Protein Products Low-sodium canned tuna and salmon. Fresh or frozen meat, poultry, seafood, and fish. Lamb. Unsalted nuts. Dried beans, peas, and lentils without added salt. Unsalted canned beans. Homemade soups without salt. Eggs.  Dairy Milk. Soy  milk. Ricotta cheese. Low-sodium or reduced-sodium cheeses. Yogurt.  Condiments Fresh and dried herbs and spices. Salt-free seasonings. Onion and garlic powders. Low-sodium varieties of mustard and ketchup. Fresh or refrigerated horseradish. Lemon juice.  Fats and Oils Reduced-sodium salad dressings. Unsalted butter.  Other Unsalted popcorn and pretzels.  The items listed above may not be a complete list of recommended foods or beverages. Contact your dietitian for more options. WHAT FOODS ARE NOT RECOMMENDED? Grains Instant hot cereals. Bread stuffing, pancake, and biscuit mixes. Croutons. Seasoned rice or pasta mixes. Noodle soup cups. Boxed or frozen macaroni and cheese. Self-rising flour. Regular salted crackers. Vegetables Regular canned vegetables. Regular canned tomato sauce and paste. Regular tomato and vegetable juices. Frozen vegetables in sauces. Salted [place] fries. Olives. Angie Fava. Relishes. Sauerkraut. Salsa. Meat and Other Protein Products Salted, canned, smoked, spiced, or pickled meats, seafood, or fish. Bacon, ham, sausage, hot dogs, corned beef, chipped beef, and packaged luncheon meats. Salt pork. Jerky. Pickled herring. Anchovies, regular canned tuna, and sardines. Salted nuts. Dairy Processed cheese and cheese spreads. Cheese curds. Blue cheese and cottage cheese. Buttermilk.  Condiments Onion and garlic salt, seasoned salt, table salt, and sea salt. Canned and packaged gravies. Worcestershire sauce. Tartar sauce. Barbecue sauce. Teriyaki sauce. Soy sauce, including reduced sodium. Steak sauce. Fish sauce. Oyster sauce. Cocktail sauce. Horseradish that you find on the shelf. Regular ketchup and mustard. Meat flavorings and tenderizers. Bouillon cubes. Hot sauce. Tabasco sauce. Marinades. Taco seasonings. Relishes. Fats and Oils Regular salad dressings. Salted butter. Margarine. Ghee. Bacon fat.  Other Potato and tortilla chips. Corn chips and puffs. Salted  popcorn and pretzels. Canned or dried soups. Pizza. Frozen entrees and pot pies.  The items listed above may not be a complete list of foods and beverages to avoid. Contact your dietitian for more information.   This information is not intended to replace advice given to you by your health care provider. Make sure you discuss any questions you have with your health care provider.   Document Released: 05/18/2002 Document Revised: 12/17/2014 Document Reviewed: 09/30/2013 Elsevier Interactive Patient Education [employer].

## 2016-06-20 ENCOUNTER — Telehealth: Payer: Self-pay | Admitting: Interventional Cardiology

## 2016-06-20 MED ORDER — LISINOPRIL 10 MG PO TABS: 20.0000 mg | ORAL_TABLET | Freq: Every day | ORAL | Status: DC

## 2016-06-20 NOTE — Telephone Encounter (Signed)
Octaviano Batty PA aware of pt's BP issues. He recommends for pt to increased his Lisinopril to 20 mg once a day . Pt to take his BP twice a day. The first BP pt is to take 2 hours after taking his BP medication in the morning,   the 2nd BP at 6 PM. Pt to keep a log of his BP reading , and call it in a week. Pt is aware of recommendations. Pt is also aware  to call any time if needed. Pt verbalized understanding.

## 2016-06-20 NOTE — Telephone Encounter (Signed)
Pt c/o BP issue: STAT if pt c/o blurred vision, one-sided weakness or slurred speech  1. What are your last 5 BP readings?Today  7/12 am- 160/93 p 72  2. Are you having any other symptoms (ex. Dizziness, headache, blurred vision, passed out)? no  3. What is your BP issue? BP high- has noticed for about 1-2 wks  Can call mobile- [phone]- if no answer @ home

## 2016-06-20 NOTE — Telephone Encounter (Signed)
Pt called because for the last 1 to 2 weeks he notice that his BP has been running high. This morning her BP after running this AM and after resting for 10 minutes, his BP was 165/98. Pt took his BP while talking to the nurse his BP was 208/108. Pt has no other symptoms. pt states he feels well,and healthy. Pt was asked to make sure that, it is a true high BP,  For him to get his BP check in a pharmacy . Pt states will go to [employer],  To  have BP checked, and to call him back in 30 minutes.  After resting in the Pharmacy store pt took his BP 188/104 to 208/110. Pt has no other symptoms.

## 2016-06-21 ENCOUNTER — Telehealth: Payer: Self-pay | Admitting: *Deleted

## 2016-06-21 NOTE — Telephone Encounter (Signed)
Follow up   Pt c/o BP issue: STAT if pt c/o blurred vision, one-sided weakness or slurred speech  1. What are your last 5 BP readings? 207/110 Hr 76  2. Are you having any other symptoms (ex. Dizziness, headache, blurred vision, passed out)? no  3. What is your BP issue? high bp

## 2016-06-21 NOTE — Telephone Encounter (Signed)
Patient advised and verbalized understanding 

## 2016-06-21 NOTE — Telephone Encounter (Signed)
**Note De-Identified  Obfuscation** The pt is very rude and difficult to talk with. I have asked my Clinical Supervisor to call him back.

## 2016-06-21 NOTE — Telephone Encounter (Signed)
Called and spoke with patient.  Pt states his BP readings this am have remained elevated, currently 187/105 with a HR of 75.  Continues to have no other symptoms, states, "I feel just fine other than knowing my blood pressure is high."  Re-emphasized to pt to continue taking lisinopril 20mg  daily, to check his BP twice a day (2 hours after taking lisinopril in the am and in the evening at around 1800), and to record his BP readings over the course of a week and call us with results.  Pt verbalizes agreement with this plan.  Pt is aware to call us any time if needed, especially if he begins to develop any physical symptoms, especially CP, SOB, dizziness, blurred vision, weakness, or slurred speech.

## 2016-06-21 NOTE — Telephone Encounter (Signed)
Patient called and is concerned about his bp readings, he did call his cardiologists office (Dr.Varanasi, but he is on vacation x 2 weeks-please see telephone encounter to them from yesterday 06/20/16). He reports that he is concerned about his bp readings, thinks something must be going on. He is completely asymptomatic but is concerned with the readings. Please advise. 06/19/16- After 1/2 mile walk 143/68 pulse 62 And time 131/65 pulse 62 06/20/16- 9:16am 168/81 and again 153/81 9:46am 174/93 pulse 73 187/98 pulse 70 Went to [employer] 207/110, 188/104 and 208/81 06/21/16- 8:42am 187/105 pulse 73 12:57pm 162/83 pulse 77 3:37pm 169/89 pulse 67

## 2016-06-21 NOTE — Telephone Encounter (Signed)
Advise pt -- I reviewed messages from cardiologist and agree with their recommendations. It looks like they increased the lisinopril dose to 20mg , and recommended that he check is BP twice a day (2 hours after taking lisinopril in the morning, and again in the evening), and to contact them with his BP results next week. If he isn't having symptoms (headaches, chest pain, dizziness, blurred vision, weakness, numbness, shortness of breath, etc), then don't worry about each individual number--the worry will just raise the BP more.  If having symptoms, he needs to be evaluated right away.

## 2016-06-21 NOTE — Telephone Encounter (Signed)
Increase lisinopril to 40 mg daily.   BMet in one week after change.

## 2016-06-22 ENCOUNTER — Telehealth: Payer: Self-pay | Admitting: Family Medicine

## 2016-06-22 NOTE — Telephone Encounter (Signed)
Per Dr Irish Lack it is ok for the pt to take increased dose of Lisinopril 20 mg daily as advised per Almyra Deforest, PA-c on 7/12 and to check BP BID for 1 week then call us with his readings.  Also, the pt came to the office this morning with a 2 day list of his BP readings and requested an appt with Dr Irish Lack. He was offered a sooner appt with an APP but the pt declined and accepted an appt with Dr Irish Lack on 9/21. The pts BP list has been given to medical records to scan into the pts chart for Dr Hassell Done review.

## 2016-06-22 NOTE — Telephone Encounter (Signed)
Noted,  Will review them on Monday when I'm in the office but through messages left yesterday, I know they have been high and that lisinopril dose was just increased.

## 2016-06-22 NOTE — Telephone Encounter (Signed)
Pt came in and dropped of a list of his bp readings. Sending back for review.

## 2016-06-25 ENCOUNTER — Telehealth: Payer: Self-pay

## 2016-06-25 ENCOUNTER — Encounter: Payer: Self-pay | Admitting: Family Medicine

## 2016-06-25 ENCOUNTER — Ambulatory Visit (INDEPENDENT_AMBULATORY_CARE_PROVIDER_SITE_OTHER): Payer: Medicare Other | Admitting: Family Medicine

## 2016-06-25 VITALS — BP 176/90 | HR 80 | Ht 71.5 in | Wt 173.4 lb

## 2016-06-25 DIAGNOSIS — I10 Essential (primary) hypertension: Secondary | ICD-10-CM | POA: Diagnosis not present

## 2016-06-25 DIAGNOSIS — I2510 Atherosclerotic heart disease of native coronary artery without angina pectoris: Secondary | ICD-10-CM

## 2016-06-25 DIAGNOSIS — R030 Elevated blood-pressure reading, without diagnosis of hypertension: Secondary | ICD-10-CM | POA: Diagnosis not present

## 2016-06-25 DIAGNOSIS — IMO0001 Reserved for inherently not codable concepts without codable children: Secondary | ICD-10-CM

## 2016-06-25 NOTE — Telephone Encounter (Signed)
Patient called and his bp's are not coming down even though he is taking the two lisinopril daily, one in the am and one in the evening. Says he brought a list of bp's in Friday for you (maybe in folder?) this am it was still in the 180's/90's. He is not okay with this. He said he needs to know what to do to get this down, Dr.Varanasi will not be back for 2 weeks and they are not helping him-told him he cannot get in until Nov 17th for an appt and he is stressing out.

## 2016-06-25 NOTE — Progress Notes (Signed)
Chief Complaint  Patient presents with  . Hypertension    has been having elevated numbers over the last week or so.   Patient presents to follow up on elevated BP's at home.  He reports that he woke up one morning, and BP was elevated.   Denies changes in diet, exercise, OTC medications.  Denies headaches, chest pain, shortness of breath. BP's since list was dropped off on Friday: 7/15: didn't walk; BP 163-167/81-86. 7/16: didn't walk.  BP 163/85, 143/77, 177/96 7/17: 177/108, 175/106 (4 hours apart).  He feels/worries that a "stent has dislodged". He reports that previously, when he had blockages, that the only symptom was that his BP suddenly jumped up. (he had urgent CABG 03/2015).  Review of his BP treatments from his chart: Diuretic was stopped due to gout treatment (but he remains on uloric) Did fine when on uloric and HCTZ together, without flares. Looks like lisinopril HCT was stopped over a year ago (changed to plain lisinopril), and BP's had been okay.  Prior to surgery was on lisinopril HCT.  After surgery, lisinopril dose was decreased to 5mg  and carvedilol was started. This was stopped shortly after surgery, and has remained on lisinopril alone, but at the 10mg  dose most recently, until changed last week to 20mg .  PMH, PSH, SH and last EKG were reviewed (from 01/2016)  Current Outpatient Prescriptions on File Prior to Visit  Medication Sig Dispense Refill  . aspirin 81 MG EC tablet Take 1 tablet (81 mg total) by mouth daily.    Marland Kitchen atorvastatin (LIPITOR) 80 MG tablet Take 1 tablet by mouth  daily 90 tablet 3  . Calcium Carbonate-Vit D-Min (CALCIUM 600+D PLUS MINERALS) 600-400 MG-UNIT TABS Take 1 tablet by mouth daily.      . febuxostat (ULORIC) 40 MG tablet Take 1 tablet by mouth  daily 90 tablet 3  . lisinopril (PRINIVIL,ZESTRIL) 10 MG tablet Take 2 tablets (20 mg total) by mouth daily. 90 tablet 0  . Multiple Vitamins-Minerals (CENTRUM SILVER PO) Take 1 tablet by mouth  daily.      . NON FORMULARY raisIns soaked in gin    . saw palmetto 160 MG capsule Take 160 mg by mouth daily.     . Biotin 5000 MCG CAPS Take 1 capsule by mouth daily. Reported on 06/25/2016    . Glucosamine 500 MG CAPS Take 1 capsule by mouth daily. Reported on 06/25/2016     No current facility-administered medications on file prior to visit.   Taking 20mg  of lisinopril since calling cardiologist last week.  No Known Allergies  ROS: no fever, chills, URI symptoms, headaches, dizziness, chest pain, palpitations, shortness of breath, DOE, edema, GI or GU complaints, bleeding, rashes or other concerns. Denies depression.  "I feel great!"  PHYSICAL EXAM: BP 176/90 mmHg  Pulse 80  Ht 5' 11.5" (1.816 m)  Wt 173 lb 6.4 oz (78.654 kg)  BMI 23.85 kg/m2 Pt's BP monitor read 178/101 176/90 on repeat by MD  Well developed, mildly anxious male in no distress HEENT: PERRL, EOMI, conjunctiva and sclera are clear Neck: No lymphadenopathy, thyromegaly or bruit Heart: regular rate and rhythm Lungs: clear bilaterally Extremities: no edema  EKG: NSR, rate 66.  No acute changes noted--poss subtle changes in III and F compared to 01/2016.  ASSESSMENT/PLAN:   Essential hypertension, benign - previously controlled with lisinopril 10mg , suddenly elevated without cause. Unclear why not on beta bloker. 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 Irish Lack He reviewed EKG--no significant change Discussed titrating up lisinopril vs adding back low dose carvedilol.   I personally don't expect to see a great response by increasing from 20 to 40mg . Start carvedilol 3.125mg  BID.   F/u with Dr. Irish Lack next week if not improving--may need  another stress test. This was communicated to the patient (requested rx to be sent to [facility], nearest pharmacy for today)

## 2016-06-25 NOTE — Telephone Encounter (Signed)
Patient will here today for a 3:30 appt.

## 2016-06-25 NOTE — Telephone Encounter (Signed)
I reviewed them this morning.  He is checking much too often (almost all day, in some cases)--anxiety may be a huge component to why the BP's are running higher.  Other factors could be inadvertant change in medications (pharmacy error?), higher sodium intake.  I'm happy to see him today--bring in his home monitor as well as his more recent BP's, and bring in all bottles of medications.

## 2016-06-25 NOTE — Telephone Encounter (Signed)
Pt request that you call him back at his cell number.   Thanks, RLB

## 2016-06-26 ENCOUNTER — Encounter: Payer: Self-pay | Admitting: Family Medicine

## 2016-06-26 MED ORDER — CARVEDILOL 3.125 MG PO TABS: 3.1250 mg | ORAL_TABLET | Freq: Two times a day (BID) | ORAL | Status: DC

## 2016-06-27 ENCOUNTER — Other Ambulatory Visit: Payer: Self-pay | Admitting: Interventional Cardiology

## 2016-06-27 ENCOUNTER — Telehealth: Payer: Self-pay

## 2016-06-27 NOTE — Telephone Encounter (Signed)
**Note De-Identified  Obfuscation** Nitroglycerin was d/c'd from the pts med list at the pts hospital discharge on 03/23/15. Is it ok to refill?

## 2016-06-27 NOTE — Telephone Encounter (Signed)
Advise him--no idea who called him.  His lipids are at goal on atorvastatin.  He should not be taking Vytorin, but continuing the atorvastatin.

## 2016-06-27 NOTE — Telephone Encounter (Signed)
Pt states yesterday he got a VCM from an unidentified male who said he needed to be taking Vytorin. Pt states he has not taken this in years and was recently seen here and Vytorin is not on chart or was discussed at appt. Can you just please verify pt should not be taking Vytorin. Pt is not sure who called whether it be an insurance company or what?

## 2016-06-27 NOTE — Telephone Encounter (Signed)
carvedilol (COREG) 3.125 MG tablet  Medication   Date: 06/26/2016  Department: [facility]  Ordering/Authorizing: Rita Ohara, MD      Order Providers    Prescribing Provider Encounter Provider   Rita Ohara, MD Rita Ohara, MD    Medication Detail      Disp Refills Start End     carvedilol (COREG) 3.125 MG tablet 60 tablet 0 06/26/2016     Sig - Route: Take 1 tablet (3.125 mg total) by mouth 2 (two) times daily with a meal. - Oral    E-Prescribing Status: Receipt confirmed by pharmacy (06/26/2016 10:34 AM EDT)     Associated Diagnoses    Essential hypertension, benign - Primary   previously controlled with lisinopril 10mg , suddenly elevated without cause. Unclear why not on beta bloker. Will contact cardiology to discuss     Coronary artery disease involving native coronary artery of native heart without angina pectoris   s/p CABG 03/2015, asymptomatic      [place], [place]

## 2016-06-27 NOTE — Telephone Encounter (Signed)
Spoke with pt- he was made aware that he should not be taking Vytorin and that he should only be taking atorvastatin for cholesterol. He is agreeable to this. Verbalized understanding. Victorino December

## 2016-07-03 NOTE — Telephone Encounter (Signed)
**Note De-Identified  Obfuscation** Refill faxed to [phone] no refills.

## 2016-07-23 ENCOUNTER — Telehealth: Payer: Self-pay

## 2016-07-23 DIAGNOSIS — I10 Essential (primary) hypertension: Secondary | ICD-10-CM

## 2016-07-23 MED ORDER — LISINOPRIL 20 MG PO TABS: 20.0000 mg | ORAL_TABLET | Freq: Two times a day (BID) | ORAL | 3 refills | Status: DC

## 2016-07-23 NOTE — Telephone Encounter (Addendum)
**Note De-Identified  Obfuscation** The pt dropped off a list of his BPs at the front desk on 8/11. He is concerned that his BP is too high.  List given to Dr Irish Lack for his review.  Per Dr Irish Lack the pt is advised to increase his Lisinopril to 20 mg BID and to have a BMET in 1 week.  The pt states that he will take the increased dose of Lisinopril and have a BMET drawn next Tuesday 8/22.  Lisinopril RX sent to [facility] to [facility] and his BMET has been ordered and scheduled to be drawn on 8/22 per the pts request..

## 2016-07-31 ENCOUNTER — Other Ambulatory Visit (INDEPENDENT_AMBULATORY_CARE_PROVIDER_SITE_OTHER): Payer: Medicare Other | Admitting: *Deleted

## 2016-07-31 DIAGNOSIS — I10 Essential (primary) hypertension: Secondary | ICD-10-CM | POA: Diagnosis not present

## 2016-07-31 LAB — BASIC METABOLIC PANEL
BUN: 11 mg/dL (ref 7–25)
CALCIUM: 8.9 mg/dL (ref 8.6–10.3)
CHLORIDE: 106 mmol/L (ref 98–110)
CO2: 27 mmol/L (ref 20–31)
CREATININE: 1.15 mg/dL (ref 0.70–1.18)
GLUCOSE: 86 mg/dL (ref 65–99)
Potassium: 4.3 mmol/L (ref 3.5–5.3)
SODIUM: 141 mmol/L (ref 135–146)

## 2016-08-08 ENCOUNTER — Other Ambulatory Visit: Payer: Self-pay | Admitting: Interventional Cardiology

## 2016-08-08 ENCOUNTER — Other Ambulatory Visit: Payer: Self-pay | Admitting: Family Medicine

## 2016-08-08 ENCOUNTER — Other Ambulatory Visit (INDEPENDENT_AMBULATORY_CARE_PROVIDER_SITE_OTHER): Payer: Medicare Other

## 2016-08-08 DIAGNOSIS — I2510 Atherosclerotic heart disease of native coronary artery without angina pectoris: Secondary | ICD-10-CM

## 2016-08-08 DIAGNOSIS — Z23 Encounter for immunization: Secondary | ICD-10-CM

## 2016-08-08 DIAGNOSIS — I10 Essential (primary) hypertension: Secondary | ICD-10-CM

## 2016-08-08 MED ORDER — NITROGLYCERIN 0.4 MG SL SUBL: 0.4000 mg | SUBLINGUAL_TABLET | SUBLINGUAL | 0 refills | Status: DC | PRN

## 2016-08-08 MED ORDER — ASPIRIN 81 MG PO TBEC: 81.0000 mg | DELAYED_RELEASE_TABLET | Freq: Every day | ORAL | 3 refills | Status: AC

## 2016-08-08 MED ORDER — CARVEDILOL 6.25 MG PO TABS
6.2500 mg | ORAL_TABLET | Freq: Two times a day (BID) | ORAL | 0 refills | Status: DC
Start: 2016-08-08 — End: 2016-09-06

## 2016-08-16 ENCOUNTER — Encounter: Payer: Self-pay | Admitting: Interventional Cardiology

## 2016-08-20 ENCOUNTER — Other Ambulatory Visit: Payer: Self-pay | Admitting: Interventional Cardiology

## 2016-08-29 NOTE — Progress Notes (Signed)
Patient ID: Bradley Terrell, male   DOB: 02-25-1941, 75 y.o.   MRN: [id number]     Cardiology Office Note   Date:  08/30/2016   ID:  Bradley Terrell, DOB 01/17/1941, MRN [id number]  PCP:  Vikki Ports, MD    No chief complaint on file. CAD   Wt Readings from Last 3 Encounters:  08/30/16 173 lb 12.8 oz (78.8 kg)  06/25/16 173 lb 6.4 oz (78.7 kg)  06/13/16 171 lb 12.8 oz (77.9 kg)       History of Present Illness: Bradley Terrell is a 75 y.o. male  who had CABG in April 2016. He had severe 3 vessel disease and left main disease. HIs CABG was done urgently. No CP or SHOB. He never did cardiac rehabilitation because he was very active on his own.   He is back to attending [employer] games regularly. Overall, he feels back to normal.   He has returned to regular exercise.  He walks twice a day.  He does yard work.  No balance problems.  Overall, he feels great.  No bleeding problems.  Over the past few months, his BP has been high.  He has had lisinopril increased and carvedilol started as well.  His BP remains high.  His systolics go up to the [id number] , but at times are in the 140s .  He feels well.  No chest pain.      Past Medical History:  Diagnosis Date  . CAD (coronary artery disease)    a. PCI in 1991 (in [place]); b. LHC 4/16: dLM 95%, severe ostial LAD and ostial CFX disease extending from the LM, severe oRI, pRCA 99%, EF 60% >> CABG  . Gilbert syndrome    elevated indirect bilirubin  . HLD (hyperlipidemia)   . HTN (hypertension)   . Inguinal hernia    R>L, easily reducible  . PAF (paroxysmal atrial fibrillation) ([place])    post CABG >> converted to NSR with Amiodarone  . Renal cyst    left    Past Surgical History:  Procedure Laterality Date  . cardiac atherectomy  '91   to LAD and RCA  . COLONOSCOPY  03/2009, 05/2014  . CORONARY ARTERY BYPASS GRAFT N/A 03/18/2015   Procedure: CORONARY ARTERY BYPASS GRAFTING (CABG) x  four, LIMA to LAD, SVG-Ramus-dCx, SVG - dRCA  using left internal mammary artery and right leg thigh & calf greater saphenous vein harvested endoscopically;  Surgeon: Grace Isaac, MD;  Location: [place];  Service: Open Heart Surgery;  Laterality: N/A;  . ENDOVEIN HARVEST OF GREATER SAPHENOUS VEIN Right 03/18/2015   Procedure: ENDOSCPOIC HARVEST OF RIGHT GREATER SAPHENOUS VEIN;  Surgeon: Grace Isaac, MD;  Location: [place] [facility];  Service: Open Heart Surgery;  Laterality: Right;  . INTRAOPERATIVE TRANSESOPHAGEAL ECHOCARDIOGRAM N/A 03/18/2015   Procedure: INTRAOPERATIVE TRANSESOPHAGEAL ECHOCARDIOGRAM;  Surgeon: Grace Isaac, MD;  Location: [place];  Service: Open Heart Surgery;  Laterality: N/A;  . LEFT HEART CATHETERIZATION WITH CORONARY ANGIOGRAM N/A 03/18/2015   Procedure: LEFT HEART CATHETERIZATION WITH CORONARY ANGIOGRAM;  Surgeon: Jettie Booze, MD;  [facility] LAB;  Service: Cardiovascular;  Laterality: N/A;  . PRK  '96   (refractive eye surgery)  . TONSILLECTOMY  age 36     Current Outpatient Prescriptions  Medication Sig Dispense Refill  . aspirin 81 MG EC tablet Take 1 tablet (81 mg total) by mouth daily. 90 tablet 3  . atorvastatin (LIPITOR) 80 MG tablet Take 1 tablet  by mouth  daily 90 tablet 3  . Biotin 5000 MCG CAPS Take 1 capsule by mouth daily. Reported on 06/25/2016    . Calcium Carbonate-Vit D-Min (CALCIUM 600+D PLUS MINERALS) 600-400 MG-UNIT TABS Take 1 tablet by mouth daily.      . carvedilol (COREG) 6.25 MG tablet Take 1 tablet (6.25 mg total) by mouth 2 (two) times daily with a meal. 60 tablet 0  . febuxostat (ULORIC) 40 MG tablet Take 1 tablet by mouth  daily 90 tablet 3  . Glucosamine 500 MG CAPS Take 1 capsule by mouth daily. Reported on 06/25/2016    . lisinopril (PRINIVIL,ZESTRIL) 20 MG tablet Take 1 tablet (20 mg total) by mouth 2 (two) times daily. 60 tablet 3  . Misc Natural Products (LUTEIN 20 PO) Take 1 capsule by mouth daily.    . Multiple Vitamins-Minerals (CENTRUM SILVER PO) Take 1 tablet by  mouth daily.      . nitroGLYCERIN (NITROSTAT) 0.4 MG SL tablet Place 0.4 mg under the tongue every 5 (five) minutes as needed for chest pain. 3 DOSES MAX    . NON FORMULARY raisIns soaked in gin    . NON FORMULARY Take 1 capsule by mouth daily.    . Omega-3 Fatty Acids (FISH OIL) 1200 MG CAPS Take 1 capsule by mouth daily.    . saw palmetto 160 MG capsule Take 160 mg by mouth daily.     . Zinc 30 MG TABS Take 1 tablet by mouth daily.     No current facility-administered medications for this visit.     Allergies:   Review of patient's allergies indicates no known allergies.    Social History:  The patient  reports that he has never smoked. He has never used smokeless tobacco. He reports that he drinks alcohol. He reports that he does not use drugs.   Family History:  The patient's family history includes Cancer in his brother, mother, other, and sister; Cancer (age of onset: 62) in his son; Cancer (age of onset: 69) in his brother; Colon cancer in his brother and son; Heart attack in his father and paternal grandfather; Heart disease in his father; Heart disease (age of onset: 13) in his brother; Hypertension in his father and sister; Parkinson's disease in his sister.    ROS:  Please see the history of present illness.   Otherwise, review of systems are positive for feeling great. " I feel like I'm 19.".   All other systems are reviewed and negative.    PHYSICAL EXAM: VS:  BP (!) 180/100   Pulse 74   Ht 5' 11.5" (1.816 m)   Wt 173 lb 12.8 oz (78.8 kg)   BMI 23.90 kg/m  , BMI Body mass index is 23.9 kg/m. GEN: Well nourished, well developed, in no acute distress  HEENT: normal  Neck: no JVD, carotid bruits, or masses Cardiac: RRR; no murmurs, rubs, or gallops,no edema  Respiratory:  clear to auscultation bilaterally, normal work of breathing GI: soft, nontender, nondistended, + BS MS: no deformity or atrophy  Skin: warm and dry, no rash Neuro:  Strength and sensation are  intact Psych: euthymic mood, full affect   EKG:   The ekg ordered today demonstrated   Recent Labs: 06/11/2016: ALT 18; Hemoglobin 15.1; Platelets 170; TSH 3.49 07/31/2016: BUN 11; Creat 1.15; Potassium 4.3; Sodium 141   Lipid Panel    Component Value Date/Time   CHOL 147 06/11/2016 0723   TRIG 82 06/11/2016 0723  HDL 61 06/11/2016 0723   CHOLHDL 2.4 06/11/2016 0723   VLDL 16 06/11/2016 0723   LDLCALC 70 06/11/2016 0723     Other studies Reviewed: Additional studies/ records that were reviewed today with results demonstrating: cath report with severe left main diseaes.   ASSESSMENT AND PLAN:  1. Coronary artery disease: Status post CABG. Doing very well. Continue aggressive secondary prevention and regular exercise. Decreased aspirin to 81 mg daily. 2. Hyperlipidemia:  Continue current medicines.Tolerating atorvastatin 80 mg well and it is effective. Stopped Vytorin 10/80 in the past because atorvastatin is less expensive. Lipids were well controlled in July 2017.  He eats a healthy diet.  He avoids red meat for the most part.  TG better.  Continue eating a healthy diet and regular exercise.  WOuld not add any meds at this time.   3. Hypertension: Blood pressure not well controlled.  COntinue lisinopril. COntinue regular exercise.  Add amlodipine 5 mg daily to help with BP.  WIll check renal Duplex to eval for RAS>  E thinks something happened since his BP changed suddenly.  4. Renal cyst: diagnosed in 2005.  No AAA at that time.   Current medicines are reviewed at length with the patient today.  The patient concerns regarding his medicines were addressed.  The following changes have been made:  No change  Labs/ tests ordered today include:  No orders of the defined types were placed in this encounter.   Recommend 150 minutes/week of aerobic exercise Low fat, low carb, high fiber diet recommended  Disposition:   FU in 1 year   Signed, Larae Grooms, MD  08/30/2016  10:23 AM    [facility] [address], [place], [place]  [address] Phone: [phone]; Fax: [phone]

## 2016-08-30 ENCOUNTER — Ambulatory Visit (INDEPENDENT_AMBULATORY_CARE_PROVIDER_SITE_OTHER): Payer: Medicare Other | Admitting: Interventional Cardiology

## 2016-08-30 ENCOUNTER — Encounter: Payer: Self-pay | Admitting: Interventional Cardiology

## 2016-08-30 VITALS — BP 180/100 | HR 74 | Ht 71.5 in | Wt 173.8 lb

## 2016-08-30 DIAGNOSIS — Z951 Presence of aortocoronary bypass graft: Secondary | ICD-10-CM | POA: Diagnosis not present

## 2016-08-30 DIAGNOSIS — E7800 Pure hypercholesterolemia, unspecified: Secondary | ICD-10-CM

## 2016-08-30 DIAGNOSIS — I10 Essential (primary) hypertension: Secondary | ICD-10-CM | POA: Diagnosis not present

## 2016-08-30 DIAGNOSIS — I2510 Atherosclerotic heart disease of native coronary artery without angina pectoris: Secondary | ICD-10-CM | POA: Diagnosis not present

## 2016-08-30 MED ORDER — AMLODIPINE BESYLATE 5 MG PO TABS: 5.0000 mg | ORAL_TABLET | Freq: Every day | ORAL | 3 refills | Status: DC

## 2016-08-30 NOTE — Patient Instructions (Signed)
Medication Instructions:  Start taking Amlodipine 5 mg daily. All other medications remain the same.  Labwork: None  Testing/Procedures: Your physician has requested that you have a renal artery duplex. During this test, an ultrasound is used to evaluate blood flow to the kidneys. Allow one hour for this exam. Do not eat after midnight the day before and avoid carbonated beverages. Take your medications as you usually do.   Follow-Up: Based on test results.     If you need a refill on your cardiac medications before your next appointment, please call your pharmacy.

## 2016-09-05 ENCOUNTER — Ambulatory Visit (HOSPITAL_COMMUNITY)
Admission: RE | Admit: 2016-09-05 | Discharge: 2016-09-05 | Disposition: A | Discharge status: Home / Self Care | Payer: Medicare Other | Source: Ambulatory Visit | Attending: Interventional Cardiology | Admitting: Interventional Cardiology

## 2016-09-05 DIAGNOSIS — I10 Essential (primary) hypertension: Secondary | ICD-10-CM | POA: Insufficient documentation

## 2016-09-05 DIAGNOSIS — R140 Abdominal distension (gaseous): Secondary | ICD-10-CM | POA: Insufficient documentation

## 2016-09-06 ENCOUNTER — Other Ambulatory Visit: Payer: Self-pay | Admitting: Family Medicine

## 2016-09-06 DIAGNOSIS — I2510 Atherosclerotic heart disease of native coronary artery without angina pectoris: Secondary | ICD-10-CM

## 2016-09-06 DIAGNOSIS — I10 Essential (primary) hypertension: Secondary | ICD-10-CM

## 2016-09-06 NOTE — Telephone Encounter (Signed)
Looks like Dr. Clayton Bibles didn't change this med, but added amlodipine.  Okay to refill x 6 mos.  I

## 2016-09-06 NOTE — Telephone Encounter (Signed)
This refill came in, do you fill this type of medication for him? He just saw Dr.Varanasi last week. Please review.

## 2016-09-22 ENCOUNTER — Other Ambulatory Visit: Payer: Self-pay | Admitting: Interventional Cardiology

## 2016-09-25 ENCOUNTER — Telehealth: Payer: Self-pay | Admitting: Interventional Cardiology

## 2016-09-25 ENCOUNTER — Encounter: Payer: Self-pay | Admitting: Interventional Cardiology

## 2016-09-25 NOTE — Telephone Encounter (Signed)
LMTCB

## 2016-09-25 NOTE — Telephone Encounter (Signed)
New Message ° °Pt call requesting to speak with RN about test results. Please call back to discuss  °

## 2016-09-25 NOTE — Telephone Encounter (Signed)
Question answered in [facility] message.

## 2016-10-08 ENCOUNTER — Other Ambulatory Visit: Payer: Self-pay | Admitting: Family Medicine

## 2016-10-08 NOTE — Telephone Encounter (Signed)
His last rx for 20mg  BID was written by Dr. Irish Lack. Looks like it is due for refills soon.  Not sure why we are getting a request for old dose--It looks like Dr. Irish Lack likely didn't send it to [facility] (he wrote for [facility]), so likely pt requested old refill.  Needs to request mail order refill from Dr. Hassell Done office.  I know he had made some other med changes, and has f/u in January with him.

## 2016-10-08 NOTE — Telephone Encounter (Signed)
Patient advised.

## 2016-10-08 NOTE — Telephone Encounter (Signed)
This came in, his chart says that he takes 20mg  BID-just want to verify that I am not missing something. Thanks.

## 2016-11-19 ENCOUNTER — Other Ambulatory Visit: Payer: Self-pay | Admitting: Family Medicine

## 2016-11-19 ENCOUNTER — Other Ambulatory Visit: Payer: Self-pay | Admitting: *Deleted

## 2016-11-19 ENCOUNTER — Telehealth: Payer: Self-pay

## 2016-11-19 MED ORDER — FEBUXOSTAT 40 MG PO TABS: ORAL_TABLET | ORAL | 0 refills | Status: DC

## 2016-11-19 NOTE — Telephone Encounter (Signed)
Faxed request rcvd in office for Uloric 40mg  to [facility]

## 2016-11-19 NOTE — Telephone Encounter (Signed)
Done

## 2016-12-04 ENCOUNTER — Other Ambulatory Visit: Payer: Self-pay | Admitting: Family Medicine

## 2016-12-11 ENCOUNTER — Other Ambulatory Visit: Payer: Medicare Other | Admitting: *Deleted

## 2016-12-11 DIAGNOSIS — I2510 Atherosclerotic heart disease of native coronary artery without angina pectoris: Secondary | ICD-10-CM

## 2016-12-11 DIAGNOSIS — E7800 Pure hypercholesterolemia, unspecified: Secondary | ICD-10-CM

## 2016-12-12 ENCOUNTER — Other Ambulatory Visit: Payer: Self-pay

## 2016-12-12 DIAGNOSIS — E7800 Pure hypercholesterolemia, unspecified: Secondary | ICD-10-CM

## 2016-12-12 LAB — HEPATIC FUNCTION PANEL
ALBUMIN: 4.2 g/dL (ref 3.5–4.8)
ALT: 25 IU/L (ref 0–44)
AST: 24 IU/L (ref 0–40)
Alkaline Phosphatase: 79 IU/L (ref 39–117)
Bilirubin Total: 1 mg/dL (ref 0.0–1.2)
Bilirubin, Direct: 0.25 mg/dL (ref 0.00–0.40)
Total Protein: 7 g/dL (ref 6.0–8.5)

## 2016-12-12 LAB — LIPID PANEL
CHOL/HDL RATIO: 3.8 ratio (ref 0.0–5.0)
Cholesterol, Total: 184 mg/dL (ref 100–199)
HDL: 48 mg/dL (ref >39)
LDL CALC: 98 mg/dL (ref 0–99)
Triglycerides: 188 mg/dL — ABNORMAL HIGH (ref 0–149)
VLDL CHOLESTEROL CAL: 38 mg/dL (ref 5–40)

## 2016-12-19 ENCOUNTER — Other Ambulatory Visit: Payer: Self-pay | Admitting: Family Medicine

## 2016-12-19 ENCOUNTER — Other Ambulatory Visit: Payer: Self-pay | Admitting: Interventional Cardiology

## 2017-01-27 NOTE — Progress Notes (Signed)
Patient ID: Bradley Terrell, male   DOB: 1941/06/19, 76 y.o.   MRN: [id number]     Cardiology Office Note   Date:  02/01/2017   ID:  Bradley Terrell, DOB August 02, 1941, MRN [id number]  PCP:  Bradley Ports, MD    No chief complaint on file. CAD   Wt Readings from Last 3 Encounters:  02/01/17 181 lb 12.8 oz (82.5 kg)  01/30/17 180 lb 6.4 oz (81.8 kg)  08/30/16 173 lb 12.8 oz (78.8 kg)       History of Present Illness: Bradley Terrell is a 76 y.o. male  who had CABG in April 2016. He had severe 3 vessel disease and left main disease. HIs CABG was done urgently. No CP or SHOB. He never did cardiac rehabilitation because he was very active on his own.   He is back to attending [employer] games regularly. Overall, he feels back to normal.   He has returned to regular exercise.  He walks twice a day.  He does yard work.  No balance problems.  Overall, he feels great.  He takes care of his dogs and cat.  No bleeding problems.  Over the past few months, his BP has been better.  Readings in the A999333 systolic range after lisinopril increased and carvedilol and amlodipine started as well.    He feels well.  No chest pain.    He asked about Brilinta and Prevagen.      Past Medical History:  Diagnosis Date  . CAD (coronary artery disease)    a. PCI in 1991 (in [place]); b. LHC 4/16: dLM 95%, severe ostial LAD and ostial CFX disease extending from the LM, severe oRI, pRCA 99%, EF 60% >> CABG  . Gilbert syndrome    elevated indirect bilirubin  . HLD (hyperlipidemia)   . HTN (hypertension)   . Inguinal hernia    R>L, easily reducible  . PAF (paroxysmal atrial fibrillation) ([place])    post CABG >> converted to NSR with Amiodarone  . Renal cyst    left    Past Surgical History:  Procedure Laterality Date  . cardiac atherectomy  '91   to LAD and RCA  . COLONOSCOPY  03/2009, 05/2014  . CORONARY ARTERY BYPASS GRAFT N/A 03/18/2015   Procedure: CORONARY ARTERY BYPASS GRAFTING (CABG) x   four, LIMA to LAD, SVG-Ramus-dCx, SVG - dRCA using left internal mammary artery and right leg thigh & calf greater saphenous vein harvested endoscopically;  Surgeon: Grace Isaac, MD;  Location: [facility];  Service: Open Heart Surgery;  Laterality: N/A;  . ENDOVEIN HARVEST OF GREATER SAPHENOUS VEIN Right 03/18/2015   Procedure: ENDOSCPOIC HARVEST OF RIGHT GREATER SAPHENOUS VEIN;  Surgeon: Grace Isaac, MD;  Location: [facility];  Service: Open Heart Surgery;  Laterality: Right;  . INTRAOPERATIVE TRANSESOPHAGEAL ECHOCARDIOGRAM N/A 03/18/2015   Procedure: INTRAOPERATIVE TRANSESOPHAGEAL ECHOCARDIOGRAM;  Surgeon: Grace Isaac, MD;  Location: [facility];  Service: Open Heart Surgery;  Laterality: N/A;  . LEFT HEART CATHETERIZATION WITH CORONARY ANGIOGRAM N/A 03/18/2015   Procedure: LEFT HEART CATHETERIZATION WITH CORONARY ANGIOGRAM;  Surgeon: Jettie Booze, MD;  Location: [facility] CATH LAB;  Service: Cardiovascular;  Laterality: N/A;  . PRK  '96   (refractive eye surgery)  . TONSILLECTOMY  age 56     Current Outpatient Prescriptions  Medication Sig Dispense Refill  . amLODipine (NORVASC) 5 MG tablet TAKE ONE TABLET BY MOUTH DAILY 90 tablet 2  . Apoaequorin (PREVAGEN PO) Take 1 tablet by  mouth daily.    Marland Kitchen aspirin 81 MG EC tablet Take 1 tablet (81 mg total) by mouth daily. 90 tablet 3  . atorvastatin (LIPITOR) 80 MG tablet Take 1 tablet by mouth  daily 90 tablet 3  . Biotin 5000 MCG CAPS Take 1 capsule by mouth daily. Reported on 06/25/2016    . Calcium Carbonate-Vit D-Min (CALCIUM 600+D PLUS MINERALS) 600-400 MG-UNIT TABS Take 1 tablet by mouth daily.      . carvedilol (COREG) 6.25 MG tablet TAKE ONE TABLET BY MOUTH TWICE A DAY WITH A MEAL 60 tablet 5  . febuxostat (ULORIC) 40 MG tablet Take 1 tablet (40 mg total) by mouth daily. 30 tablet 11  . Glucosamine 500 MG CAPS Take 1 capsule by mouth daily. Reported on 06/25/2016    . lisinopril (PRINIVIL,ZESTRIL) 20 MG tablet Take 1 tablet (20 mg total) by mouth  2 (two) times daily. 60 tablet 3  . Misc Natural Products (LUTEIN 20 PO) Take 1 capsule by mouth daily.    . Multiple Vitamins-Minerals (CENTRUM SILVER PO) Take 1 tablet by mouth daily.      . nitroGLYCERIN (NITROSTAT) 0.4 MG SL tablet DISSOLVE 1 TABLET UNDER THE TONGUE EVERY 5 MINUTES AS  NEEDED FOR CHEST PAIN 75 tablet 1  . NON FORMULARY raisIns soaked in gin    . NON FORMULARY Take 1 capsule by mouth daily.    . Omega-3 Fatty Acids (FISH OIL) 1200 MG CAPS Take 1 capsule by mouth daily.    . saw palmetto 160 MG capsule Take 160 mg by mouth daily.     . Zinc 30 MG TABS Take 1 tablet by mouth daily.     No current facility-administered medications for this visit.     Allergies:   Patient has no known allergies.    Social History:  The patient  reports that he has never smoked. He has never used smokeless tobacco. He reports that he drinks alcohol. He reports that he does not use drugs.   Family History:  The patient's family history includes Cancer in his brother, mother, other, and sister; Cancer (age of onset: 6) in his son; Cancer (age of onset: 76) in his brother; Colon cancer in his brother and son; Heart attack in his father and paternal grandfather; Heart disease in his father; Heart disease (age of onset: 82) in his brother; Hypertension in his father and sister; Parkinson's disease in his sister.    ROS:  Please see the history of present illness.   Otherwise, review of systems are positive for feeling great. " I feel like I'm 30.".   All other systems are reviewed and negative.    PHYSICAL EXAM: VS:  BP (!) 154/76   Pulse 72   Ht 5\' 11"  (1.803 m)   Wt 181 lb 12.8 oz (82.5 kg)   SpO2 99%   BMI 25.36 kg/m  , BMI Body mass index is 25.36 kg/m. GEN: Well nourished, well developed, in no acute distress  HEENT: normal  Neck: no JVD, carotid bruits, or masses Cardiac: RRR; no murmurs, rubs, or gallops,no edema  Respiratory:  clear to auscultation bilaterally, normal work of  breathing GI: soft, nontender, nondistended, + BS MS: no deformity or atrophy  Skin: warm and dry, no rash Neuro:  Strength and sensation are intact Psych: euthymic mood, full affect      Recent Labs: 06/11/2016: Hemoglobin 15.1; Platelets 170; TSH 3.49 01/28/2017: ALT 21; BUN 13; Creat 1.14; Potassium 4.1; Sodium 139  Lipid Panel    Component Value Date/Time   CHOL 144 01/28/2017 1011   CHOL 184 12/11/2016 0921   TRIG 105 01/28/2017 1011   HDL 56 01/28/2017 1011   HDL 48 12/11/2016 0921   CHOLHDL 2.6 01/28/2017 1011   VLDL 21 01/28/2017 1011   LDLCALC 67 01/28/2017 1011   LDLCALC 98 12/11/2016 0921     Other studies Reviewed: Additional studies/ records that were reviewed today with results demonstrating: cath report with severe left main disease.   ASSESSMENT AND PLAN:  1. Coronary artery disease: Status post CABG. Doing very well. Continue aggressive secondary prevention and regular exercise. Decreased aspirin to 81 mg daily.  No indication for Brilinta per his question.  Could consider clopidogrel but he is doing well with aspirin. 2. Hyperlipidemia:  Continue current medicines.Tolerating atorvastatin 80 mg well and it is effective. Stopped Vytorin 10/80 in the past because atorvastatin is less expensive. Lipids were well controlled in July 2017 and Feb 2018.  He eats a healthy diet.  He avoids red meat for the most part.  TG better.  Continue eating a healthy diet and regular exercise.  WOuld not add any meds at this time.   3. Hypertension: Blood pressure well controlled.  COntinue lisinopril. COntinue regular exercise.  Continue amlodipine 5 mg daily to help with BP.  No RAS by Duplex in 9/17.  Home readings reviewed and in the A999333 systolic range. 4. Renal cyst: diagnosed in 2005.  No AAA at that time. No AAA in 9/17 by renal Duplex. 5. He is taking Prevagen for memory without any problems.   Current medicines are reviewed at length with the patient today.  The  patient concerns regarding his medicines were addressed.  The following changes have been made:  No change  Labs/ tests ordered today include:  No orders of the defined types were placed in this encounter.   Recommend 150 minutes/week of aerobic exercise Low fat, low carb, high fiber diet recommended  Disposition:   FU in 1 year   Signed, Larae Grooms, MD  02/01/2017 9:05 AM    [facility] [address], [place], [place]  [address] Phone: [phone]; Fax: [phone]

## 2017-01-28 ENCOUNTER — Other Ambulatory Visit (INDEPENDENT_AMBULATORY_CARE_PROVIDER_SITE_OTHER): Payer: Medicare Other

## 2017-01-28 DIAGNOSIS — Z5181 Encounter for therapeutic drug level monitoring: Secondary | ICD-10-CM | POA: Diagnosis not present

## 2017-01-28 DIAGNOSIS — I10 Essential (primary) hypertension: Secondary | ICD-10-CM | POA: Diagnosis not present

## 2017-01-28 DIAGNOSIS — R7301 Impaired fasting glucose: Secondary | ICD-10-CM

## 2017-01-28 DIAGNOSIS — E7800 Pure hypercholesterolemia, unspecified: Secondary | ICD-10-CM | POA: Diagnosis not present

## 2017-01-28 DIAGNOSIS — M109 Gout, unspecified: Secondary | ICD-10-CM

## 2017-01-28 LAB — COMPREHENSIVE METABOLIC PANEL
ALBUMIN: 3.9 g/dL (ref 3.6–5.1)
ALT: 21 U/L (ref 9–46)
AST: 23 U/L (ref 10–35)
Alkaline Phosphatase: 60 U/L (ref 40–115)
BUN: 13 mg/dL (ref 7–25)
CHLORIDE: 105 mmol/L (ref 98–110)
CO2: 27 mmol/L (ref 20–31)
CREATININE: 1.14 mg/dL (ref 0.70–1.18)
Calcium: 9 mg/dL (ref 8.6–10.3)
Glucose, Bld: 109 mg/dL — ABNORMAL HIGH (ref 65–99)
POTASSIUM: 4.1 mmol/L (ref 3.5–5.3)
Sodium: 139 mmol/L (ref 135–146)
TOTAL PROTEIN: 6.4 g/dL (ref 6.1–8.1)
Total Bilirubin: 1.2 mg/dL (ref 0.2–1.2)

## 2017-01-28 LAB — HEMOGLOBIN A1C
Hgb A1c MFr Bld: 5.2 % (ref <5.7)
Mean Plasma Glucose: 103 mg/dL

## 2017-01-28 LAB — LIPID PANEL
CHOL/HDL RATIO: 2.6 ratio (ref <5.0)
CHOLESTEROL: 144 mg/dL (ref <200)
HDL: 56 mg/dL (ref >40)
LDL Cholesterol: 67 mg/dL (ref <100)
Triglycerides: 105 mg/dL (ref <150)
VLDL: 21 mg/dL (ref <30)

## 2017-01-29 LAB — URIC ACID: URIC ACID, SERUM: 3.8 mg/dL — AB (ref 4.0–8.0)

## 2017-01-29 NOTE — Progress Notes (Signed)
Chief Complaint  Patient presents with  . Medicare Wellness    nonfasting AWV/CPE/med check. No concerns. (does have a small cyst on his left forearm he did want to bring to your attention)   Bradley Terrell is a 75 y.o. male who presents for annual physical exam, Medicare wellness visit and follow-up on chronic medical conditions.  He has the following concerns:  2/2--[facility] came to his home.  Brings in info--recommended lipid screen, glaucoma screen and AWV.  Normal clock drawing on the paperwork he showed me.  HTN:  His blood pressures went up rather suddenly in July.  He saw Dr. Varanasi in September, and amlodipine 5mg wa added to his medication regimen, and renal US was performed. No RAS or AAA was noted. Blood pressures at home are running 115-160.  He checks is 4x, every 2 minutes, immediately upon finishing fast walk. High at first, then comes down.  Mostly 125-145 after initial check. He has f/u with Dr. Varanasi later this week. Denies cough, headaches, dizziness, chest pain, shortness of breath.  CAD: He had bypass surgery in April 2016 and is doing well. No longer having any exertional dyspnea. Denies chest pain, DOE.  Has f/u later this week with Dr. Varanasi.  Hyperlipidemia follow-up: Compliant with medications and denies medication side effects.  His Vytorin was changed to Lipitor 80mg in the Spring of 2015 by Dr. Varanasi due to cost.He denies any side effects.  Gout: Hasn't had any flares. Remains on uloric. He is no longer on HCTZ. Offered cutting back or trial off Uloric in the past, but he declines--doing well without any gout flares on current medication (and prefers to continue this way, since not having side effects)   Immunization History  Administered Date(s) Administered  . Influenza Split 09/17/2012  . Influenza Whole 08/25/2013  . Influenza, High Dose Seasonal PF 09/23/2014, 08/08/2016  . Influenza-Unspecified 09/10/2015  .  Pneumococcal Conjugate-13 10/22/2013  . Pneumococcal Polysaccharide-23 03/10/2005  . Tdap 01/10/2009  . Zoster 01/10/2005   Last colonoscopy: 05/2014 with Dr. Hayes (diverticulosis). Last PSA: 06/2016 Dentist: once yearly  Ophtho: hasn't been seen in about 3 years Exercise: Yardwork. Walks 30 minutes twice daily, at least 5x/week (usually 7 days). Does pushups and bodyweight training.  Has dumbbells at home also.  Other doctors caring for patient include: GI: Dr. Hayes Cardiologist: Dr. Varanasi CT surgeon: Dr. Gerhardt Dentist: Dr. Mottinger Ophtho: [facility] (male) Dermatologist: Dr. Gould   Depression screen:  Negative Fall screen: negative Functional status screen is unremarkable See epic for full questionnaires/screens.  End of Life Discussion:  Patient has a living will and medical power of attorney  Past Medical History:  Diagnosis Date  . CAD (coronary artery disease)    a. PCI in 1991 (in [place]); b. LHC 4/16: dLM 95%, severe ostial LAD and ostial CFX disease extending from the LM, severe oRI, pRCA 99%, EF 60% >> CABG  . Gilbert syndrome    elevated indirect bilirubin  . HLD (hyperlipidemia)   . HTN (hypertension)   . Inguinal hernia    R>L, easily reducible  . PAF (paroxysmal atrial fibrillation) ([facility])    post CABG >> converted to NSR with Amiodarone  . Renal cyst    left    Past Surgical History:  Procedure Laterality Date  . cardiac atherectomy  '91   to LAD and RCA  . COLONOSCOPY  03/2009, 05/2014  . CORONARY ARTERY BYPASS GRAFT N/A 03/18/2015   Procedure: CORONARY ARTERY BYPASS   GRAFTING (CABG) x  four, LIMA to LAD, SVG-Ramus-dCx, SVG - dRCA using left internal mammary artery and right leg thigh & calf greater saphenous vein harvested endoscopically;  Surgeon: Grace Isaac, MD;  Location: [facility];  Service: Open Heart Surgery;  Laterality: N/A;  . ENDOVEIN HARVEST OF GREATER SAPHENOUS VEIN Right 03/18/2015   Procedure: ENDOSCPOIC HARVEST OF  RIGHT GREATER SAPHENOUS VEIN;  Surgeon: Grace Isaac, MD;  Location: [facility];  Service: Open Heart Surgery;  Laterality: Right;  . INTRAOPERATIVE TRANSESOPHAGEAL ECHOCARDIOGRAM N/A 03/18/2015   Procedure: INTRAOPERATIVE TRANSESOPHAGEAL ECHOCARDIOGRAM;  Surgeon: Grace Isaac, MD;  Location: [facility];  Service: Open Heart Surgery;  Laterality: N/A;  . LEFT HEART CATHETERIZATION WITH CORONARY ANGIOGRAM N/A 03/18/2015   Procedure: LEFT HEART CATHETERIZATION WITH CORONARY ANGIOGRAM;  Surgeon: Jettie Booze, MD;  Location: [facility] CATH LAB;  Service: Cardiovascular;  Laterality: N/A;  . PRK  '96   (refractive eye surgery)  . TONSILLECTOMY  age 36    Social History   Social History  . Marital status: Married    Spouse name: N/A  . Number of children: 5  . Years of education: N/A   Occupational History  . 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 Smoking status: Never Smoker  . Smokeless tobacco: Never Used  . Alcohol use 0.0 oz/week     Comment: bourbon and ginger daily, wine with dinner, stinger in the evening. occasionally  . Drug use: No  . Sexual activity: Not on file   Other Topics Concern  . Not on file   Social History Narrative   Married, 1 dog, 1 cat.  Son in [place] (has terminal colon cancer)--passed away 12-14-15, 3 in [place]. 7 grandchildren, 2 great-grandchildren   Lost job 07/2014.    Family History  Problem Relation Age of Onset  . Cancer Mother     breast  . Hypertension Father   . Heart disease Father   . Heart attack Father   . Cancer Sister     pancreatic cancer  . Heart disease Brother 50    CABG  . Cancer Brother     colon  . Colon cancer Brother     late 75's  . Cancer Son 27    colon cancer, recurred at 25 (metastatic)  . Colon cancer Son   . Cancer Brother 54    lung cancer, smoker  . Parkinson's disease Sister   . Hypertension Sister   . Cancer Other     pancreatic  . Heart attack Paternal Grandfather    . Diabetes Neg Hx     Outpatient Encounter Prescriptions as of 01/30/2017  Medication Sig Note  . amLODipine (NORVASC) 5 MG tablet TAKE ONE TABLET BY MOUTH DAILY   . Apoaequorin (PREVAGEN PO) Take 1 tablet by mouth daily.   Marland Kitchen aspirin 81 MG EC tablet Take 1 tablet (81 mg total) by mouth daily.   Marland Kitchen atorvastatin (LIPITOR) 80 MG tablet Take 1 tablet by mouth  daily   . Biotin 5000 MCG CAPS Take 1 capsule by mouth daily. Reported on 06/25/2016   . Calcium Carbonate-Vit D-Min (CALCIUM 600+D PLUS MINERALS) 600-400 MG-UNIT TABS Take 1 tablet by mouth daily.     . carvedilol (COREG) 6.25 MG tablet TAKE ONE TABLET BY MOUTH TWICE A DAY WITH A MEAL   . febuxostat (ULORIC) 40 MG tablet Take 1 tablet (40 mg total) by mouth daily.   . Glucosamine  500 MG CAPS Take 1 capsule by mouth daily. Reported on 06/25/2016   . lisinopril (PRINIVIL,ZESTRIL) 20 MG tablet Take 1 tablet (20 mg total) by mouth 2 (two) times daily.   . Misc Natural Products (LUTEIN 20 PO) Take 1 capsule by mouth daily.   . Multiple Vitamins-Minerals (CENTRUM SILVER PO) Take 1 tablet by mouth daily.     . NON FORMULARY raisIns soaked in gin 05/26/2015: .  . NON FORMULARY Take 1 capsule by mouth daily. 06/25/2016: Contains: Vitamin D 5,000iu, Vit K2 80mcg, Mg 125mg, zinc 10mcg  . Omega-3 Fatty Acids (FISH OIL) 1200 MG CAPS Take 1 capsule by mouth daily.   . saw palmetto 160 MG capsule Take 160 mg by mouth daily.    . Zinc 30 MG TABS Take 1 tablet by mouth daily.   . [DISCONTINUED] ULORIC 40 MG tablet TAKE ONE TABLET BY MOUTH DAILY   . nitroGLYCERIN (NITROSTAT) 0.4 MG SL tablet DISSOLVE 1 TABLET UNDER THE TONGUE EVERY 5 MINUTES AS  NEEDED FOR CHEST PAIN (Patient not taking: Reported on 01/30/2017)   . [DISCONTINUED] carvedilol (COREG) 6.25 MG tablet TAKE ONE TABLET BY MOUTH TWICE A DAY WITH A MEAL   . [DISCONTINUED] lisinopril (PRINIVIL,ZESTRIL) 10 MG tablet TAKE 1 TABLET BY MOUTH  DAILY    No facility-administered encounter medications on file  as of 01/30/2017.     No Known Allergies   ROS: The patient denies anorexia, fever, weight changes, headaches, vision loss, decreased hearing, ear pain, hoarseness, chest pain, palpitations, dizziness, syncope, dyspnea on exertion, cough, swelling, nausea, vomiting, diarrhea, constipation, abdominal pain, melena, hematochezia, indigestion/heartburn, hematuria, erectile dysfunction, nocturia, weakened urine stream, dysuria, genital lesions, joint pains, numbness, tingling, weakness, tremor, suspicious skin lesions, depression, anxiety, abnormal bleeding/bruising, or enlarged lymph nodes. Sees Dr. Karen Gould yearly. Occasional dribbling at end of void--unchanged Cyst/lump increased slightly in size at the left forearm. 7# weight gain--he relates this to not working.  Denies eating more, just on his feet less. Denies memory concerns (started the prevagen preventatively)   PHYSICAL EXAM:  BP 118/70 (BP Location: Right Arm, Patient Position: Sitting, Cuff Size: Normal)   Pulse 72   Ht 5' 11" (1.803 m)   Wt 180 lb 6.4 oz (81.8 kg)   BMI 25.16 kg/m    General Appearance:  Alert, cooperative, no distress, appears stated age. He is unshaven today, some body odor noted in exam room  Head:  Normocephalic, without obvious abnormality, atraumatic   Eyes:  PERRL, conjunctiva/corneas clear, EOM's intact, fundi benign   Ears:  Normal TM's and external ear canals   Nose:  Nares normal, mucosa normal, no drainage or sinus tenderness   Throat:  Lips, mucosa, and tongue normal; teeth and gums normal   Neck:  Supple, no lymphadenopathy; thyroid: no enlargement/tenderness/nodules; no carotid bruit or JVD   Back:  Spine nontender, no curvature, ROM normal, no CVA tenderness   Lungs:  Clear to auscultation bilaterally without wheezes, rales or ronchi; respirations unlabored   Chest Wall:  No tenderness or deformity   Heart:  Regular rate and rhythm, S1 and S2  normal, no murmur, rub or gallop   Breast Exam:  No chest wall tenderness, masses or gynecomastia   Abdomen:  Soft, non-tender, nondistended, normoactive bowel sounds,  no masses, no hepatosplenomegaly   Genitalia:  Normal male external genitalia without lesions. Testicles without masses. R>L inguinal hernias present, easily reducible, nontender   Rectal:  Normal sphincter tone, no masses or tenderness; guaiac negative stool.   Prostate slightly larger on right than left; no nodules, nontender.   Extremities:  No clubbing, cyanosis or edema. Thickened onychomycotic toenails bilaterally  Pulses:  2+ and symmetric all extremities   Skin:  Skin color, texture, turgor normal, no rashes or lesions. Actinic skin changes throughout. 2 x 1cm area of soft tissue swelling, mobile, nontender along the central portion of posterior forearm. No erythema, fluctuance. Sebaceous cyst central upper back (black pore); no erythema, fluctuance or tenderness  Lymph nodes:  Cervical, supraclavicular, and axillary nodes normal   Neurologic:  CNII-XII intact, normal strength, sensation and gait; reflexes 2+ and symmetric throughout    Psych: Normal mood, affect, hygiene and grooming     Chemistry      Component Value Date/Time   NA 139 01/28/2017 1011   K 4.1 01/28/2017 1011   CL 105 01/28/2017 1011   CO2 27 01/28/2017 1011   BUN 13 01/28/2017 1011   CREATININE 1.14 01/28/2017 1011      Component Value Date/Time   CALCIUM 9.0 01/28/2017 1011   ALKPHOS 60 01/28/2017 1011   AST 23 01/28/2017 1011   ALT 21 01/28/2017 1011   BILITOT 1.2 01/28/2017 1011   BILITOT 1.0 12/11/2016 0921     Fasting glucose 107  Lab Results  Component Value Date   CHOL 144 01/28/2017   HDL 56 01/28/2017   LDLCALC 67 01/28/2017   TRIG 105 01/28/2017   CHOLHDL 2.6 01/28/2017   Lab Results  Component Value Date   LABURIC 3.8 (L) 01/28/2017   Lab Results  Component  Value Date   HGBA1C 5.2 01/28/2017    ASSESSMENT/PLAN:  Annual physical exam - Plan: POCT Urinalysis Dipstick, Visual acuity screening  Encounter for Medicare annual wellness exam  Essential hypertension, benign - BP well controlled; higher at home (immediately after exercise)  Pure hypercholesterolemia - lipids at goal; continue low cholesterol diet and atorvastatin  Impaired fasting glucose - normal A1c  Medication monitoring encounter  Prostate cancer screening - normal prostate exam. Discussed PSA's and discussed stopping further PSA testing (he is agreeable to this)  Chronic gout without tophus, unspecified cause, unspecified site - prefers to continue uloric - Plan: febuxostat (ULORIC) 40 MG tablet  Immunization due - Plan: Pneumococcal polysaccharide vaccine 23-valent greater than or equal to 2yo subcutaneous/IM   Pneumovax booster. shingrix when available  Cyst vs lipoma on left forearm.  Reassured--return if increasing pain, redness, size, or other concerns. Also reviewed s/sx infected sebaceous cyst (upper back/neck).   Discussed PSA screening (risks/benefits)--now agrees to stop monitoring; recommended at least 30 minutes of aerobic activity at least 5 days/week, weight-bearing exercise at least 2x/wk; proper sunscreen use reviewed; healthy diet and alcohol recommendations (less than or equal to 2 drinks/day) reviewed; regular seatbelt use; changing batteries in smoke detectors. Self-testicular exams. Immunization recommendations discussed-- pneumovax booster today. Continue yearly flu shots.  Shingrix when available. Colonoscopy recommendations reviewed, UTD  MOST form reviewed and updated  Full Code, Full Care.  F/u 6 months Labs prior: c-met, lipid, CBC, TSH (lipids are ordered for July already from Dr. V).  Encouraged to schedule routine ophtho exam Asked to get us copy of Living Will and healthcare POA MOST form reviewed and updated--unchanged.  Full Code,  Full Care   Medicare Attestation I have personally reviewed: The patient's medical and social history Their use of alcohol, tobacco or illicit drugs Their current medications and supplements The patient's functional ability including ADLs,fall risks, home safety risks, cognitive, and hearing and visual   impairment Diet and physical activities Evidence for depression or mood disorders  The patient's weight, height, BMI, and visual acuity have been recorded in the chart.  I have made referrals, counseling, and provided education to the patient based on review of the above and I have provided the patient with a written personalized care plan for preventive services.     Cherylene Ferrufino A, MD   01/29/2017

## 2017-01-30 ENCOUNTER — Ambulatory Visit (INDEPENDENT_AMBULATORY_CARE_PROVIDER_SITE_OTHER): Payer: Medicare Other | Admitting: Family Medicine

## 2017-01-30 ENCOUNTER — Encounter: Payer: Self-pay | Admitting: Family Medicine

## 2017-01-30 VITALS — BP 118/70 | HR 72 | Ht 71.0 in | Wt 180.4 lb

## 2017-01-30 DIAGNOSIS — R7301 Impaired fasting glucose: Secondary | ICD-10-CM | POA: Diagnosis not present

## 2017-01-30 DIAGNOSIS — Z23 Encounter for immunization: Secondary | ICD-10-CM | POA: Diagnosis not present

## 2017-01-30 DIAGNOSIS — Z0000 Encounter for general adult medical examination without abnormal findings: Secondary | ICD-10-CM

## 2017-01-30 DIAGNOSIS — E7800 Pure hypercholesterolemia, unspecified: Secondary | ICD-10-CM

## 2017-01-30 DIAGNOSIS — I10 Essential (primary) hypertension: Secondary | ICD-10-CM

## 2017-01-30 DIAGNOSIS — Z5181 Encounter for therapeutic drug level monitoring: Secondary | ICD-10-CM

## 2017-01-30 DIAGNOSIS — M1A9XX0 Chronic gout, unspecified, without tophus (tophi): Secondary | ICD-10-CM | POA: Diagnosis not present

## 2017-01-30 DIAGNOSIS — Z125 Encounter for screening for malignant neoplasm of prostate: Secondary | ICD-10-CM | POA: Diagnosis not present

## 2017-01-30 LAB — POCT URINALYSIS DIPSTICK
BILIRUBIN UA: NEGATIVE
GLUCOSE UA: NEGATIVE
Ketones, UA: NEGATIVE
Leukocytes, UA: NEGATIVE
Nitrite, UA: NEGATIVE
Protein, UA: NEGATIVE
RBC UA: NEGATIVE
SPEC GRAV UA: 1.015
Urobilinogen, UA: NEGATIVE
pH, UA: 6

## 2017-01-30 MED ORDER — FEBUXOSTAT 40 MG PO TABS: 40.0000 mg | ORAL_TABLET | Freq: Every day | ORAL | 11 refills | Status: DC

## 2017-01-30 NOTE — Patient Instructions (Addendum)
  HEALTH MAINTENANCE RECOMMENDATIONS:  It is recommended that you get at least 30 minutes of aerobic exercise at least 5 days/week (for weight loss, you may need as much as 60-90 minutes). This can be any activity that gets your heart rate up. This can be divided in 10-15 minute intervals if needed, but try and build up your endurance at least once a week.  Weight bearing exercise is also recommended twice weekly.  Eat a healthy diet with lots of vegetables, fruits and fiber.  "Colorful" foods have a lot of vitamins (ie green vegetables, tomatoes, red peppers, etc).  Limit sweet tea, regular sodas and alcoholic beverages, all of which has a lot of calories and sugar.  Up to 2 alcoholic drinks daily may be beneficial for men (unless trying to lose weight, watch sugars).  Drink a lot of water.  Sunscreen of at least SPF 30 should be used on all sun-exposed parts of the skin when outside between the hours of 10 am and 4 pm (not just when at beach or pool, but even with exercise, golf, tennis, and yard work!)  Use a sunscreen that says "broad spectrum" so it covers both UVA and UVB rays, and make sure to reapply every 1-2 hours.  Remember to change the batteries in your smoke detectors when changing your clock times in the spring and fall.  Use your seat belt every time you are in a car, and please drive safely and not be distracted with cell phones and texting while driving.   Bradley Terrell , Thank you for taking time to come for your Medicare Wellness Visit. I appreciate your ongoing commitment to your health goals. Please review the following plan we discussed and let me know if I can assist you in the future.   These are the goals we discussed: Goals    None      This is a list of the screening recommended for you and due dates:  Health Maintenance  Topic Date Due  . Tetanus Vaccine  01/10/2019  . Flu Shot  Completed  . Pneumonia vaccines  Completed   I'm giving you a pneumovax booster  today. I recommend getting the new shingles vaccine (Shingrix) when available. You will need to check with your insurance to see if it is covered, and if covered by Medicare Part D, you need to get from the pharmacy rather than our office.  It is a series of 2 injections, spaced 2 months apart.  We elected to stop screening for prostate cancer with PSA, just with yearly prostate (rectal) exams.  Please schedule a routine eye exam.

## 2017-02-01 ENCOUNTER — Encounter: Payer: Self-pay | Admitting: Interventional Cardiology

## 2017-02-01 ENCOUNTER — Ambulatory Visit (INDEPENDENT_AMBULATORY_CARE_PROVIDER_SITE_OTHER): Payer: Medicare Other | Admitting: Interventional Cardiology

## 2017-02-01 VITALS — BP 154/76 | HR 72 | Ht 71.0 in | Wt 181.8 lb

## 2017-02-01 DIAGNOSIS — Z951 Presence of aortocoronary bypass graft: Secondary | ICD-10-CM | POA: Diagnosis not present

## 2017-02-01 DIAGNOSIS — I2510 Atherosclerotic heart disease of native coronary artery without angina pectoris: Secondary | ICD-10-CM | POA: Diagnosis not present

## 2017-02-01 DIAGNOSIS — I10 Essential (primary) hypertension: Secondary | ICD-10-CM

## 2017-02-01 DIAGNOSIS — E7800 Pure hypercholesterolemia, unspecified: Secondary | ICD-10-CM

## 2017-02-01 NOTE — Patient Instructions (Signed)
Medication Instructions:  None  Labwork: None  Testing/Procedures: None  Follow-Up: Your physician wants you to follow-up in: 1 year with Dr. Varanasi.  You will receive a reminder letter in the mail two months in advance. If you don't receive a letter, please call our office to schedule the follow-up appointment.   Any Other Special Instructions Will Be Listed Below (If Applicable).     If you need a refill on your cardiac medications before your next appointment, please call your pharmacy.   

## 2017-02-28 ENCOUNTER — Other Ambulatory Visit: Payer: Self-pay | Admitting: Interventional Cardiology

## 2017-05-10 ENCOUNTER — Encounter: Payer: Self-pay | Admitting: Family Medicine

## 2017-05-10 ENCOUNTER — Ambulatory Visit (INDEPENDENT_AMBULATORY_CARE_PROVIDER_SITE_OTHER): Payer: Medicare Other | Admitting: Family Medicine

## 2017-05-10 VITALS — BP 116/70 | HR 68 | Wt 177.0 lb

## 2017-05-10 DIAGNOSIS — R410 Disorientation, unspecified: Secondary | ICD-10-CM

## 2017-05-10 DIAGNOSIS — Z5181 Encounter for therapeutic drug level monitoring: Secondary | ICD-10-CM

## 2017-05-10 DIAGNOSIS — M1A9XX0 Chronic gout, unspecified, without tophus (tophi): Secondary | ICD-10-CM

## 2017-05-10 LAB — CBC WITH DIFFERENTIAL/PLATELET
Basophils Absolute: 0 cells/uL (ref 0–200)
Basophils Relative: 0 %
EOS PCT: 2 %
Eosinophils Absolute: 100 cells/uL (ref 15–500)
HCT: 43.7 % (ref 38.5–50.0)
HEMOGLOBIN: 14.5 g/dL (ref 13.2–17.1)
Lymphocytes Relative: 25 %
Lymphs Abs: 1250 cells/uL (ref 850–3900)
MCH: 31.7 pg (ref 27.0–33.0)
MCHC: 33.2 g/dL (ref 32.0–36.0)
MCV: 95.4 fL (ref 80.0–100.0)
MONOS PCT: 13 %
MPV: 10.6 fL (ref 7.5–12.5)
Monocytes Absolute: 650 cells/uL (ref 200–950)
NEUTROS ABS: 3000 {cells}/uL (ref 1500–7800)
Neutrophils Relative %: 60 %
PLATELETS: 181 10*3/uL (ref 140–400)
RBC: 4.58 MIL/uL (ref 4.20–5.80)
RDW: 13.5 % (ref 11.0–15.0)
WBC: 5 10*3/uL (ref 4.0–10.5)

## 2017-05-10 LAB — COMPREHENSIVE METABOLIC PANEL
ALT: 18 U/L (ref 9–46)
AST: 18 U/L (ref 10–35)
Albumin: 3.8 g/dL (ref 3.6–5.1)
Alkaline Phosphatase: 59 U/L (ref 40–115)
BILIRUBIN TOTAL: 1.1 mg/dL (ref 0.2–1.2)
BUN: 13 mg/dL (ref 7–25)
CHLORIDE: 105 mmol/L (ref 98–110)
CO2: 25 mmol/L (ref 20–31)
CREATININE: 1.09 mg/dL (ref 0.70–1.18)
Calcium: 8.7 mg/dL (ref 8.6–10.3)
Glucose, Bld: 183 mg/dL — ABNORMAL HIGH (ref 65–99)
Potassium: 3.6 mmol/L (ref 3.5–5.3)
SODIUM: 138 mmol/L (ref 135–146)
TOTAL PROTEIN: 6 g/dL — AB (ref 6.1–8.1)

## 2017-05-10 MED ORDER — FEBUXOSTAT 40 MG PO TABS: 40.0000 mg | ORAL_TABLET | Freq: Every day | ORAL | 11 refills | Status: DC

## 2017-05-10 NOTE — Progress Notes (Signed)
   Subjective:    Patient ID: Bradley Terrell, male    DOB: 11-17-1941, 76 y.o.   MRN: [id number]  HPI He is here for evaluation of confusion. He states he woke up yesterday feeling confused with lack of focus but as the day progressed he improved. He also noted that again today. He's had no blurred vision, double vision, headache, weakness, sleep disturbance. He does drink usually having 1 or 2 beers during the day and 1 at night when his wife comes home. He notes no undue stress. He also will need a refill on his you [place].  Review of Systems     Objective:   Physical Exam Alert and in no distress. EOMI. Cerebellar normal. DTRs normal negative finger to nose. Tympanic membranes and canals are normal. Pharyngeal area is normal. Neck is supple without adenopathy or thyromegaly. Cardiac exam shows a regular sinus rhythm without murmurs or gallops. Lungs are clear to auscultation. Over 25 minutes, greater than 50% spent in counseling and coordination of care.        Assessment & Plan:  Confusion - Plan: CBC with Differential/Platelet, Comprehensive metabolic panel  Chronic gout without tophus, unspecified cause, unspecified site - prefers to continue uloric - Plan: febuxostat (ULORIC) 40 MG tablet  Medication monitoring encounter - Plan: CBC with Differential/Platelet, Comprehensive metabolic panel  Unclear as to what's going on with the confusion however he does clear up is a day goes on which makes me less concerned. Follow up after blood work is back.  Also of note is the fact that he does feel somewhat lost in that he no longer is working and this is weighing heavily on him. I discussed the possibility of him volunteering to give himself per nursing goals.

## 2017-06-05 ENCOUNTER — Ambulatory Visit
Admission: RE | Admit: 2017-06-05 | Discharge: 2017-06-05 | Disposition: A | Discharge status: Home / Self Care | Payer: Medicare Other | Source: Ambulatory Visit | Attending: Family Medicine | Admitting: Family Medicine

## 2017-06-05 ENCOUNTER — Ambulatory Visit (INDEPENDENT_AMBULATORY_CARE_PROVIDER_SITE_OTHER): Payer: Medicare Other | Admitting: Family Medicine

## 2017-06-05 ENCOUNTER — Encounter: Payer: Self-pay | Admitting: Family Medicine

## 2017-06-05 VITALS — BP 134/80 | HR 80 | Ht 71.0 in | Wt 174.2 lb

## 2017-06-05 DIAGNOSIS — R7301 Impaired fasting glucose: Secondary | ICD-10-CM

## 2017-06-05 DIAGNOSIS — R930 Abnormal findings on diagnostic imaging of skull and head, not elsewhere classified: Secondary | ICD-10-CM

## 2017-06-05 DIAGNOSIS — R4189 Other symptoms and signs involving cognitive functions and awareness: Secondary | ICD-10-CM

## 2017-06-05 DIAGNOSIS — R413 Other amnesia: Secondary | ICD-10-CM | POA: Diagnosis not present

## 2017-06-05 LAB — TSH: TSH: 1.42 mIU/L (ref 0.40–4.50)

## 2017-06-05 NOTE — Progress Notes (Signed)
Chief Complaint  Patient presents with  . Advice Only    having confusion/memory loss. Would like to see what is going on and if there is anything he could possibly take.    Patient presents accompanied by his son (visiting from [facility]) and his wife to discuss confusion and memory loss, possible depression as well.  Worked every day since he quit HS for the [employer], until his job at [employer] facility let him go. He tries to keep busy, but finds that he can get everything he needs done by 9:30, and still has the rest of the day. After wife leaves for work, he gets on computer to look for jobs. Not interested or eligible for most.  Then takes care of banking.  Then tries to find things to do--yardwork.    At some points during the day he gets "cloudy"--trouble focusing. First noticed 3-4 months ago. Usually occurs when at home, reading, watching TV.  Lasts 5-10 minutes. Usually just once/day. Denies noting any pattern related to eating/meals.    Wife:   She doesn't see the changes he reports. She has no concerns.  Merrilee Seashore (son): Memory declining in the last couple of years, noted by him and his sister.  (He lives in [place]). Not fixing things that need to get done in the home (ie laundry room in disrepair)--unusual for his dad not to do this. Some short-term memory issues (ie today's appointment time). Repetitiveness is worse in the evenings, to the point where he tries not to call after a certain time of day.  He became concerned when he called him last week to help with banking issues.  He has never expressed any concern/problem with this in the past.  PMH, Fernando Salinas, Lucky reviewed  Outpatient Encounter Prescriptions as of 06/05/2017  Medication Sig Note  . amLODipine (NORVASC) 5 MG tablet TAKE ONE TABLET BY MOUTH DAILY   . Apoaequorin (PREVAGEN PO) Take 1 tablet by mouth daily.   Marland Kitchen aspirin 81 MG EC tablet Take 1 tablet (81 mg total) by mouth daily.   Marland Kitchen atorvastatin (LIPITOR) 80 MG tablet  Take 1 tablet by mouth  daily   . Biotin 5000 MCG CAPS Take 1 capsule by mouth daily. Reported on 06/25/2016   . Calcium Carbonate-Vit D-Min (CALCIUM 600+D PLUS MINERALS) 600-400 MG-UNIT TABS Take 1 tablet by mouth daily.     . carvedilol (COREG) 6.25 MG tablet TAKE ONE TABLET BY MOUTH TWICE A DAY WITH A MEAL   . febuxostat (ULORIC) 40 MG tablet Take 1 tablet (40 mg total) by mouth daily.   . Glucosamine 500 MG CAPS Take 1 capsule by mouth daily. Reported on 06/25/2016   . lisinopril (PRINIVIL,ZESTRIL) 20 MG tablet TAKE ONE TABLET BY MOUTH TWICE A DAY   . Misc Natural Products (LUTEIN 20 PO) Take 1 capsule by mouth daily.   . Multiple Vitamins-Minerals (CENTRUM SILVER PO) Take 1 tablet by mouth daily.     . NON FORMULARY raisIns soaked in gin 05/26/2015: .  Marland Kitchen NON FORMULARY Take 1 capsule by mouth daily. 06/25/2016: Contains: Vitamin D 5,000iu, Vit K2 74mcg, Mg 125mg , zinc 79mcg  . Omega-3 Fatty Acids (FISH OIL) 1200 MG CAPS Take 1 capsule by mouth daily.   . saw palmetto 160 MG capsule Take 160 mg by mouth daily.    . Zinc 30 MG TABS Take 1 tablet by mouth daily.   . nitroGLYCERIN (NITROSTAT) 0.4 MG SL tablet DISSOLVE 1 TABLET UNDER THE TONGUE EVERY  5 MINUTES AS  NEEDED FOR CHEST PAIN (Patient not taking: Reported on 06/05/2017)    No facility-administered encounter medications on file as of 06/05/2017.    No Known Allergies  ROS:  No fever, chills, URI symptoms, urinary complaints, GI or GU complaints (just some urinary frequency related to prostate, no changes).  No joint pains, skin concerns, or other concerns except as noted in HPI.  PHYSICAL EXAM:  BP 134/80 (BP Location: Left Arm, Patient Position: Sitting, Cuff Size: Normal)   Pulse 80   Ht 5\' 11"  (1.803 m)   Wt 174 lb 3.2 oz (79 kg)   BMI 24.30 kg/m   Elderly, alert, well-appearing male in no distress "if I was any better there would be two of me!"--the same comment every time he comes, in response to "how are you" HEENT: PERRL  ,EOMI, conjunctiva and sclera are clear. Fundi benign. OP clear Neck: No lymphadenopathy, thyromegaly or bruit Heart: regular rate and rhythm Lungs: clear bilaterally Back: no cVA tenderness or spinal tenderness Abdomen: soft nontender, no mass Extremities: no edema, normal pulse Skin: very tanned, normal turgor, no lesions Neuro: alert.  Not oriented, as per MMSE below.  Normal cranial nerves, gait, strength Psych: normal mood, affect, hygiene and grooming   PHQ9 score 1 (feels down, depressed several days in the last 2 weeks). Son believes he may be more depressed than he let on.  MMSE 25/30--1/5 points for orientation, stating it was Thurs, 14th of July, 2017, summer Only other error was in his drawing (drew intersecting pentagon with hexagon) Clock drawing abnormal--clock face only had 8 numbers.   Reviewed recent lab results as ordered by Dr. Redmond School 05/10/17: Lab Results  Component Value Date   WBC 5.0 05/10/2017   HGB 14.5 05/10/2017   HCT 43.7 05/10/2017   MCV 95.4 05/10/2017   PLT 181 05/10/2017     Chemistry      Component Value Date/Time   NA 138 05/10/2017 1301   K 3.6 05/10/2017 1301   CL 105 05/10/2017 1301   CO2 25 05/10/2017 1301   BUN 13 05/10/2017 1301   CREATININE 1.09 05/10/2017 1301      Component Value Date/Time   CALCIUM 8.7 05/10/2017 1301   ALKPHOS 59 05/10/2017 1301   AST 18 05/10/2017 1301   ALT 18 05/10/2017 1301   BILITOT 1.1 05/10/2017 1301   BILITOT 1.0 12/11/2016 0921     Last sugar 182, not fasting   ASSESSMENT/PLAN:  Cognitive impairment - abnormal clock drawing and way he answered MMSE (definitely, no "I don't knows") doesn't suggested pseudodementia, though at risk for depression - Plan: TSH, Vitamin B12, CT Head Wo Contrast  Impaired fasting glucose - last A1c normal; recent nonfasting glu high--recheck a1c now rather than waiting until August - Plan: Hemoglobin A1c   TSH, B12, A1c  Ddx of memory issues discussed in  detail. Discussed potential CT findings.  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  Assuming no other abnormality, they are interested in willing to try aricept. Son also interested in setting up neuro consult (knowing he won't be seen right away). Risks/side effects of med reviewed in detail.  After reviewing CT results, will send rx for Aricept 5mg ---[facility] Oppenheim at [facility]  Will need 4 wk f/u after starting med, if not seeing neuro yet.  REFER [facility]UR[facility] and aricept rx after CT results back.  45-55 min visit, more than 1/2 spent counseling.

## 2017-06-05 NOTE — Patient Instructions (Addendum)
We will be in contact with the CT results and lab results within the next day or two. We will let you know if we are starting the medication, and will refer you to neurologist for their opinion as well.

## 2017-06-06 LAB — HEMOGLOBIN A1C
Hgb A1c MFr Bld: 5.2 % (ref <5.7)
Mean Plasma Glucose: 103 mg/dL

## 2017-06-06 LAB — VITAMIN B12: VITAMIN B 12: 324 pg/mL (ref 200–1100)

## 2017-06-06 MED ORDER — DONEPEZIL HCL 5 MG PO TABS: 5.0000 mg | ORAL_TABLET | Freq: Every day | ORAL | 0 refills | Status: DC

## 2017-06-06 NOTE — Addendum Note (Signed)
Addended by: Rita Ohara on: 06/06/2017 07:53 AM   Modules accepted: Orders

## 2017-06-08 ENCOUNTER — Other Ambulatory Visit: Payer: Self-pay | Admitting: Interventional Cardiology

## 2017-06-10 ENCOUNTER — Other Ambulatory Visit: Payer: Medicare Other | Admitting: *Deleted

## 2017-06-10 DIAGNOSIS — E7800 Pure hypercholesterolemia, unspecified: Secondary | ICD-10-CM

## 2017-06-10 LAB — LIPID PANEL
Chol/HDL Ratio: 2.3 ratio (ref 0.0–5.0)
Cholesterol, Total: 137 mg/dL (ref 100–199)
HDL: 59 mg/dL (ref >39)
LDL CALC: 61 mg/dL (ref 0–99)
TRIGLYCERIDES: 85 mg/dL (ref 0–149)
VLDL Cholesterol Cal: 17 mg/dL (ref 5–40)

## 2017-06-10 LAB — HEPATIC FUNCTION PANEL
ALBUMIN: 3.9 g/dL (ref 3.5–4.8)
ALT: 16 IU/L (ref 0–44)
AST: 18 IU/L (ref 0–40)
Alkaline Phosphatase: 78 IU/L (ref 39–117)
BILIRUBIN, DIRECT: 0.31 mg/dL (ref 0.00–0.40)
Bilirubin Total: 0.9 mg/dL (ref 0.0–1.2)
TOTAL PROTEIN: 6.3 g/dL (ref 6.0–8.5)

## 2017-06-17 ENCOUNTER — Ambulatory Visit (INDEPENDENT_AMBULATORY_CARE_PROVIDER_SITE_OTHER): Payer: Medicare Other | Admitting: Family Medicine

## 2017-06-17 ENCOUNTER — Encounter: Payer: Self-pay | Admitting: Family Medicine

## 2017-06-17 VITALS — BP 110/72 | HR 84 | Ht 71.0 in | Wt 171.2 lb

## 2017-06-17 DIAGNOSIS — E7800 Pure hypercholesterolemia, unspecified: Secondary | ICD-10-CM

## 2017-06-17 DIAGNOSIS — I10 Essential (primary) hypertension: Secondary | ICD-10-CM | POA: Diagnosis not present

## 2017-06-17 DIAGNOSIS — R4189 Other symptoms and signs involving cognitive functions and awareness: Secondary | ICD-10-CM | POA: Diagnosis not present

## 2017-06-17 NOTE — Patient Instructions (Signed)
  Your Vitamin B12 level was within the normal range, but on the low end.  I recommend you taking a separate Vitamin B12 supplement (1 mg (1000 mcg) daily).  [facility] should be calling you some time this week to schedule your appointment.    I left Merrilee Seashore my cell phone # to call me back with any concerns. Somebody had a medication concern to schedule this appointment today, but clearly you seem to not have any concerns of your own.  I'd like to try and see who scheduled the visit to see what their concern is.  Continue taking the donepezil every night.  Your blood pressure and cholesterol are excellent. Continue all of your current medications--just add in the B12 vitamin.

## 2017-06-17 NOTE — Progress Notes (Signed)
Chief Complaint  Patient presents with  . Follow-up    states that he is not sure why he is here today.    He was started on aricept after his last visit 6/27. He reports he is taking this at night and denies any side effect.  He reports he did not schedule this appointment (thought it was routine f/u); wife states she did not schedule it either, and he denies any issues with his new medication (as stated as reason for appointment).  He lets me know he had labs done last week, and reports results were normal (for cardiologist).  He has not yet heard from neurologist re: consult appointment.   PMH, PSH, SH reviewed  Outpatient Encounter Prescriptions as of 06/17/2017  Medication Sig Note  . amLODipine (NORVASC) 5 MG tablet TAKE ONE TABLET BY MOUTH DAILY   . Apoaequorin (PREVAGEN PO) Take 1 tablet by mouth daily.   Marland Kitchen aspirin 81 MG EC tablet Take 1 tablet (81 mg total) by mouth daily.   Marland Kitchen atorvastatin (LIPITOR) 80 MG tablet TAKE ONE TABLET BY MOUTH DAILY   . Biotin 5000 MCG CAPS Take 1 capsule by mouth daily. Reported on 06/25/2016   . Calcium Carbonate-Vit D-Min (CALCIUM 600+D PLUS MINERALS) 600-400 MG-UNIT TABS Take 1 tablet by mouth daily.     . carvedilol (COREG) 6.25 MG tablet TAKE ONE TABLET BY MOUTH TWICE A DAY WITH A MEAL   . donepezil (ARICEPT) 5 MG tablet Take 1 tablet (5 mg total) by mouth at bedtime.   . febuxostat (ULORIC) 40 MG tablet Take 1 tablet (40 mg total) by mouth daily.   . Glucosamine 500 MG CAPS Take 1 capsule by mouth daily. Reported on 06/25/2016   . lisinopril (PRINIVIL,ZESTRIL) 20 MG tablet TAKE ONE TABLET BY MOUTH TWICE A DAY   . Misc Natural Products (LUTEIN 20 PO) Take 1 capsule by mouth daily.   . Multiple Vitamins-Minerals (CENTRUM SILVER PO) Take 1 tablet by mouth daily.     . nitroGLYCERIN (NITROSTAT) 0.4 MG SL tablet DISSOLVE 1 TABLET UNDER THE TONGUE EVERY 5 MINUTES AS  NEEDED FOR CHEST PAIN   . NON FORMULARY raisIns soaked in gin 05/26/2015: .  Marland Kitchen NON  FORMULARY Take 1 capsule by mouth daily. 06/25/2016: Contains: Vitamin D 5,000iu, Vit K2 52mcg, Mg 125mg , zinc 38mcg  . Omega-3 Fatty Acids (FISH OIL) 1200 MG CAPS Take 1 capsule by mouth daily.   . saw palmetto 160 MG capsule Take 160 mg by mouth daily.    . Zinc 30 MG TABS Take 1 tablet by mouth daily.    No facility-administered encounter medications on file as of 06/17/2017.    No Known Allergies  ROS: no headaches, dizziness, chest pain, URI symptoms, fever, shortness of breath, bleeding, bruising or other concerns.  PHYSCAL EXAM:  BP 110/72 (BP Location: Left Arm, Patient Position: Sitting, Cuff Size: Normal)   Pulse 84   Ht 5\' 11"  (1.803 m)   Wt 171 lb 3.2 oz (77.7 kg)   BMI 23.88 kg/m   Well appearing, pleasant male in no distress. Conjunctiva and sclera are clear.   He is alert, cranial nerves intact, normal speech, hygiene and grooming. Normal mood, affect.  Lab Results  Component Value Date   CHOL 137 06/10/2017   HDL 59 06/10/2017   LDLCALC 61 06/10/2017   TRIG 85 06/10/2017   CHOLHDL 2.3 06/10/2017     Chemistry      Component Value Date/Time   NA 138  05/10/2017 1301   K 3.6 05/10/2017 1301   CL 105 05/10/2017 1301   CO2 25 05/10/2017 1301   BUN 13 05/10/2017 1301   CREATININE 1.09 05/10/2017 1301      Component Value Date/Time   CALCIUM 8.7 05/10/2017 1301   ALKPHOS 78 06/10/2017 0745   AST 18 06/10/2017 0745   ALT 16 06/10/2017 0745   BILITOT 0.9 06/10/2017 0745     Lab Results  Component Value Date   VITAMINB12 324 06/05/2017   Lab Results  Component Value Date   TSH 1.42 06/05/2017   ASSESSMENT/PLAN:  Cognitive impairment - tolerating aricept so far, continue. neuro consult as planned (to be scheduled soon)  Pure hypercholesterolemia - at goal on current regimen, continue  Essential hypertension, benign - well controlled   I called his son, Bradley Terrell, from [place], to see if he had called to make the appointment or had any concerns.  He  reports that neither he nor his sister are aware of any medication problem, and neither of them scheduled today's appointment.  I will try and look into who scheduled this (and why), by checking with front staff who scheduled visit. He doesn't appear to be having any problem with his aricept.  We called over to [place] re: referral--they state [facility] will be contacting him this week to schedule appointment (stated they got behind due to the 7/4 holiday).    Your Vitamin B12 level was within the normal range, but on the low end.  I recommend you taking a separate Vitamin B12 supplement (1 mg (1000 mcg) daily).  [facility] should be calling you some time this week to schedule your appointment.    I left Bradley Terrell my cell phone # to call me back with any concerns. Somebody had a medication concern to schedule this appointment today, but clearly you seem to not have any concerns of your own.  I'd like to try and see who scheduled the visit to see what their concern is.  Continue taking the donepezil every night.  Your blood pressure and cholesterol are excellent. Continue all of your current medications--just add in the B12 vitamin.

## 2017-06-19 ENCOUNTER — Institutional Professional Consult (permissible substitution): Payer: Medicare Other | Admitting: Family Medicine

## 2017-06-25 ENCOUNTER — Telehealth: Payer: Self-pay | Admitting: *Deleted

## 2017-06-25 ENCOUNTER — Telehealth: Payer: Self-pay | Admitting: Family Medicine

## 2017-06-25 NOTE — Telephone Encounter (Signed)
error 

## 2017-06-25 NOTE — Telephone Encounter (Signed)
Patient advised.

## 2017-06-25 NOTE — Telephone Encounter (Signed)
Advise: That is sweet. 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  I loved Dr. Jorene Guest (male) and Dr. Derenda Fennel (male) where I worked, at [employer] (in [place], [place], [place] the [place] with [place]).  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 I would think that you family could find you some good recommendations, close to your new home.  Best of luck--we will miss him!

## 2017-06-25 NOTE — Telephone Encounter (Signed)
Pt requesting to speak to [place] about "life changes" that are occurring. No health or medical issues or changes. Pt would like for [place] to call him to discuss this further.

## 2017-06-25 NOTE — Telephone Encounter (Signed)
Patient called to let you know that he and Blanch Media are moving to the [facility] area [facility]) to be near children mid to end of next month. He was wondering since he remembered that you had told him that you had worked out there in the past if you had any connections there and know of any PCP's that are as WONDERFUL as you and have an Environmental consultant like me?!

## 2017-06-25 NOTE — Telephone Encounter (Signed)
Patient called back and stated that the city is not [place], it is [place], [place].

## 2017-07-02 ENCOUNTER — Other Ambulatory Visit: Payer: Self-pay | Admitting: Family Medicine

## 2017-07-02 DIAGNOSIS — R4189 Other symptoms and signs involving cognitive functions and awareness: Secondary | ICD-10-CM

## 2017-07-02 NOTE — Telephone Encounter (Signed)
No--the dose is supposed to be increased to 10mg  after 1 month.  I hadn't been sure of when he would get in with neuro, so f/u wasn't scheduled separate from his August visit. Looks like neuro appointment is after he is due to see me next. Please verify with patient that he is still tolerating the med fine, and let him know that we are increasing the dose to 10mg , and let us know if he has any problems with the higher dose.  He has appointment with me  in August, and has neuro appt shortly after.   As per 7/17 message, I know that he is moving to [place] soon.  Can you please see if he will still be in [place] for the August appointments he has, or if we need to cancel them ([place] and neuro)  Okay for 10mg  of aricept generic #30 with 1 refill.  He will need to f/u with somebody within that time to see if anything else needs to be done.

## 2017-07-02 NOTE — Telephone Encounter (Signed)
Pt was just here and you noted to continue on med. Just wanted to see if ok to refill more than 30 days at a time

## 2017-07-03 NOTE — Telephone Encounter (Signed)
Left message for pt to call me back 

## 2017-07-06 ENCOUNTER — Other Ambulatory Visit: Payer: Self-pay | Admitting: Family Medicine

## 2017-07-09 MED ORDER — DONEPEZIL HCL 10 MG PO TABS: 10.0000 mg | ORAL_TABLET | Freq: Every day | ORAL | 1 refills | Status: DC

## 2017-07-09 NOTE — Telephone Encounter (Signed)
Spoke to patient and he states that he is not moving until mid November to [facility] December. I have increased the med to 10mg  # 30 1 refill. Pt is still coming to appt in august for lab visit and appt with Dr. Tomi Bamberger

## 2017-07-10 ENCOUNTER — Other Ambulatory Visit: Payer: Self-pay | Admitting: *Deleted

## 2017-07-29 ENCOUNTER — Other Ambulatory Visit: Payer: Medicare Other

## 2017-07-29 DIAGNOSIS — Z0000 Encounter for general adult medical examination without abnormal findings: Secondary | ICD-10-CM | POA: Diagnosis not present

## 2017-07-29 DIAGNOSIS — Z5181 Encounter for therapeutic drug level monitoring: Secondary | ICD-10-CM

## 2017-07-29 DIAGNOSIS — I10 Essential (primary) hypertension: Secondary | ICD-10-CM

## 2017-07-29 DIAGNOSIS — R7301 Impaired fasting glucose: Secondary | ICD-10-CM | POA: Diagnosis not present

## 2017-07-29 LAB — CBC WITH DIFFERENTIAL/PLATELET
BASOS ABS: 42 {cells}/uL (ref 0–200)
Basophils Relative: 1 %
EOS PCT: 2 %
Eosinophils Absolute: 84 cells/uL (ref 15–500)
HCT: 42.9 % (ref 38.5–50.0)
HEMOGLOBIN: 14.5 g/dL (ref 13.2–17.1)
LYMPHS ABS: 1092 {cells}/uL (ref 850–3900)
Lymphocytes Relative: 26 %
MCH: 32.7 pg (ref 27.0–33.0)
MCHC: 33.8 g/dL (ref 32.0–36.0)
MCV: 96.8 fL (ref 80.0–100.0)
MONOS PCT: 15 %
MPV: 9.9 fL (ref 7.5–12.5)
Monocytes Absolute: 630 cells/uL (ref 200–950)
NEUTROS ABS: 2352 {cells}/uL (ref 1500–7800)
NEUTROS PCT: 56 %
PLATELETS: 182 10*3/uL (ref 140–400)
RBC: 4.43 MIL/uL (ref 4.20–5.80)
RDW: 13.3 % (ref 11.0–15.0)
WBC: 4.2 10*3/uL (ref 4.0–10.5)

## 2017-07-29 LAB — TSH: TSH: 1.72 m[IU]/L (ref 0.40–4.50)

## 2017-07-30 LAB — COMPREHENSIVE METABOLIC PANEL
ALK PHOS: 66 U/L (ref 40–115)
ALT: 20 U/L (ref 9–46)
AST: 21 U/L (ref 10–35)
Albumin: 3.9 g/dL (ref 3.6–5.1)
BUN: 9 mg/dL (ref 7–25)
CHLORIDE: 106 mmol/L (ref 98–110)
CO2: 21 mmol/L (ref 20–32)
Calcium: 8.8 mg/dL (ref 8.6–10.3)
Creat: 0.95 mg/dL (ref 0.70–1.18)
Glucose, Bld: 119 mg/dL — ABNORMAL HIGH (ref 65–99)
POTASSIUM: 3.9 mmol/L (ref 3.5–5.3)
Sodium: 140 mmol/L (ref 135–146)
TOTAL PROTEIN: 6.2 g/dL (ref 6.1–8.1)
Total Bilirubin: 0.8 mg/dL (ref 0.2–1.2)

## 2017-07-30 NOTE — Progress Notes (Signed)
Chief Complaint  Patient presents with  . Hyperlipidemia    nonfasting med check. Feels like medication is helping somewhat with his demetia (wife says she hasn't paid attention).     Patient presents for 6 month med check without any specific concerns.  Cognitive impairment.  He was started on Aricept 2 months ago, dose increased from 5 to 31m about 3 weeks ago.  He denies any side effects. He has appointment with neurologist to follow up on this (per pt/son's request) on 08/06/17. Family wanted them to move to [place] this Fall, but they don't think now is a good time to sell.  His wife JBlanch Mediaretired, and they have started going through the house, room by room, in preparation to sell/move.  HTN:  Blood pressures at home are running 130/70's (higher initially, improves with rechecks, continuing to check it every 2 minutes after his walk, starts out high, then comes down).  He didn't bring in a list with him (no longer recording).  Also pulls out his phone and does a reading though an app using just the fingertip (read within 10 seconds).  124/70.  (He had me do mine--pulse was not accurate, off by 20 points.) Denies cough, headaches, dizziness, chest pain, shortness of breath.  CAD: He had bypass surgery in April 2016 and is doing well. No longer having any exertional dyspnea. Denies chest pain, DOE.  He last saw Dr. VIrish Lackin 01/2017, with f/u recommended in 1 year.  Hyperlipidemia follow-up: Compliant with medications and denies medication side effects.  His Vytorin was changed to Lipitor 864min the Spring of 2015 by Dr. VaIrish Lackue to cost.He denies any side effects. Lab Results  Component Value Date   CHOL 137 06/10/2017   HDL 59 06/10/2017   LDLCALC 61 06/10/2017   TRIG 85 06/10/2017   CHOLHDL 2.3 06/10/2017   Gout: Hasn't had any flares. Remains on uloric. (He is no longer on HCTZ. Previously discussed trial off Uloric in the past, but he declined--doing well without any gout  flares or side effects).  Impaired fasting glucose.  Last A1c was normal in June. Tries to limit sweets, carbs  He reports his weight loss has been intentional.  Wife retired. They are eating less overall.  He continues regular exercise.  PHYSICAL EXAM:  BP 134/80 (BP Location: Right Arm, Patient Position: Sitting, Cuff Size: Normal)   Pulse 80   Ht 5' 11" (1.803 m)   Wt 166 lb (75.3 kg)   BMI 23.15 kg/m    124/74 on repeat by MD   Wt Readings from Last 3 Encounters:  07/31/17 166 lb (75.3 kg)  06/17/17 171 lb 3.2 oz (77.7 kg)  06/05/17 174 lb 3.2 oz (79 kg)    Well developed, pleasant, thin, suntanned male in good spirits. He is unshaven today, with some body odor noted. HEENT: PERRL, EOMI, conjunctiva clear. OP normal Neck: no lymphadenopathy, thyromegaly or carotid bruit Heart: regular rate and rhythm, no murmur Chest: nontender, no mass Lungs: clear bilaterally Back: no CVA or spinal tenderness Abdomen: soft, nontender, no organomegaly or mass Extremities: no clubbing, cyanosis or edema;  2+ pulses Skin: no rashes/lesions; very tanned Neuro: alert, cranial nerves grossly intact; normal strength, gait Psych: normal mood, affect, hygiene and grooming     Chemistry      Component Value Date/Time   NA 140 07/29/2017 0823   K 3.9 07/29/2017 0823   CL 106 07/29/2017 0823   CO2 21 07/29/2017 0823  BUN 9 07/29/2017 0823   CREATININE 0.95 07/29/2017 0823      Component Value Date/Time   CALCIUM 8.8 07/29/2017 0823   ALKPHOS 66 07/29/2017 0823   AST 21 07/29/2017 0823   ALT 20 07/29/2017 0823   BILITOT 0.8 07/29/2017 0823   BILITOT 0.9 06/10/2017 0745     Fasting glucose 119  Lab Results  Component Value Date   WBC 4.2 07/29/2017   HGB 14.5 07/29/2017   HCT 42.9 07/29/2017   MCV 96.8 07/29/2017   PLT 182 07/29/2017   Lab Results  Component Value Date   TSH 1.72 07/29/2017   Lab Results  Component Value Date   HGBA1C 5.2 06/05/2017      ASSESSMENT/PLAN:  Essential hypertension, benign - controlled, continue current regimen. doubt phone app is accurate for monitoring. Record values from home monitor and bring to f/u visits  Impaired fasting glucose - continue proper diet, regular exercise  Pure hypercholesterolemia  Cognitive impairment - tolerating 47m aricept, scheduled for neuro consult next week  Chronic gout without tophus, unspecified cause, unspecified site - continue uloric  Coronary artery disease involving native coronary artery of native heart without angina pectoris    F/u with Dr. YKrista Blueas scheduled next week Continue current medications. dscussed weight loss   F/u 6 months for CPE/AWV/med check, with labs prior  c-met, lipid, uric acid, A1c, PSA

## 2017-07-31 ENCOUNTER — Ambulatory Visit (INDEPENDENT_AMBULATORY_CARE_PROVIDER_SITE_OTHER): Payer: Medicare Other | Admitting: Family Medicine

## 2017-07-31 ENCOUNTER — Encounter: Payer: Self-pay | Admitting: Family Medicine

## 2017-07-31 VITALS — BP 124/74 | HR 80 | Ht 71.0 in | Wt 166.0 lb

## 2017-07-31 DIAGNOSIS — R4189 Other symptoms and signs involving cognitive functions and awareness: Secondary | ICD-10-CM

## 2017-07-31 DIAGNOSIS — I2510 Atherosclerotic heart disease of native coronary artery without angina pectoris: Secondary | ICD-10-CM | POA: Diagnosis not present

## 2017-07-31 DIAGNOSIS — I10 Essential (primary) hypertension: Secondary | ICD-10-CM

## 2017-07-31 DIAGNOSIS — R7301 Impaired fasting glucose: Secondary | ICD-10-CM

## 2017-07-31 DIAGNOSIS — E7800 Pure hypercholesterolemia, unspecified: Secondary | ICD-10-CM | POA: Diagnosis not present

## 2017-07-31 DIAGNOSIS — Z5181 Encounter for therapeutic drug level monitoring: Secondary | ICD-10-CM | POA: Diagnosis not present

## 2017-07-31 DIAGNOSIS — M1A9XX0 Chronic gout, unspecified, without tophus (tophi): Secondary | ICD-10-CM

## 2017-07-31 DIAGNOSIS — Z125 Encounter for screening for malignant neoplasm of prostate: Secondary | ICD-10-CM | POA: Diagnosis not present

## 2017-07-31 MED ORDER — CARVEDILOL 6.25 MG PO TABS: ORAL_TABLET | ORAL | 5 refills | Status: DC

## 2017-07-31 NOTE — Patient Instructions (Addendum)
Continue your current medications. Continue to periodically monitor your blood pressure at home. Bring a list with your values to your next visit  (physical). Return sooner if running high or low, or other concerns.  You are seeing the neurologist next week--I will keep my eyes out for the outcome of that appointment.

## 2017-08-01 NOTE — Progress Notes (Signed)
Pt called and appts made.

## 2017-08-06 ENCOUNTER — Ambulatory Visit (INDEPENDENT_AMBULATORY_CARE_PROVIDER_SITE_OTHER): Payer: Medicare Other | Admitting: Neurology

## 2017-08-06 ENCOUNTER — Encounter: Payer: Self-pay | Admitting: Neurology

## 2017-08-06 DIAGNOSIS — R413 Other amnesia: Secondary | ICD-10-CM | POA: Diagnosis not present

## 2017-08-06 NOTE — Progress Notes (Signed)
PATIENT: Bradley Terrell DOB: 1941/10/06  Chief Complaint  Patient presents with  . Cognitive Impairment    MMSE 25/30 - 13 animals.  He is here with his wife, Bradley Terrell, to have his worsening memory evaluated.  He started taking donepezil 10mg , daily in July 2017 and feels this has been beneficial.  They would also like to review his recent abnormal CT head.    Marland Kitchen PCP    Bradley Ohara, MD     HISTORICAL  Bradley Terrell is a 76 year old right-handed male, accompanied by his wife Bradley Terrell, seen in refer by  his primary care doctor Bradley Terrell, for evaluation of memory loss, initial evaluation was August 06 2017.  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 His wife also complains of memory loss,   He gives me permission to call his son Bradley Terrell son at [phone] to update his medical informations.   I have personally reviewed CT head without contrast on June 05 2017, no acute abnormality, moderate supratentorium small vessel disease, generalized atrophy,  Laboratory evaluation in July 29 2017: Normal CMP creatinine of 0.95, glucose of 119, normal CBC with hemoglobin of 14.5, B12, TSH, A1c of 5.2, fasting lipid profile was  normal LDL was 67  REVIEW OF SYSTEMS: Full 14 system review of systems performed and notable only for as above  ALLERGIES: No Known Allergies  HOME MEDICATIONS: Current Outpatient Prescriptions  Medication Sig Dispense Refill  . amLODipine (NORVASC) 5 MG tablet TAKE ONE TABLET BY MOUTH DAILY 90 tablet 2  . Apoaequorin (PREVAGEN PO) Take 1 tablet by mouth daily.    Marland Kitchen aspirin 81 MG EC tablet Take 1 tablet (81 mg total) by mouth daily. 90 tablet 3  . atorvastatin (LIPITOR) 80 MG tablet TAKE ONE TABLET BY MOUTH DAILY 90 tablet 1  . Biotin 5000 MCG CAPS Take 1 capsule by mouth daily. Reported on 06/25/2016    . Calcium Carbonate-Vit D-Min (CALCIUM 600+D PLUS MINERALS) 600-400 MG-UNIT TABS Take 1 tablet by mouth daily.      . carvedilol (COREG) 6.25 MG tablet TAKE ONE TABLET BY MOUTH TWICE A DAY WITH A MEAL 60 tablet 5  . donepezil (ARICEPT) 10 MG tablet Take 1 tablet (10 mg total) by mouth at bedtime. 30 tablet 1  . febuxostat (ULORIC) 40 MG tablet Take 1 tablet (40 mg total) by mouth daily. 30 tablet 11  . Glucosamine 500 MG CAPS Take 1 capsule by mouth daily. Reported on 06/25/2016    . lisinopril (PRINIVIL,ZESTRIL) 20 MG tablet TAKE ONE TABLET BY MOUTH TWICE A DAY 180 tablet 3  . Misc Natural Products (LUTEIN 20 PO) Take 1 capsule by mouth daily.    . Multiple Vitamins-Minerals (  CENTRUM SILVER PO) Take 1 tablet by mouth daily.      . nitroGLYCERIN (NITROSTAT) 0.4 MG SL tablet DISSOLVE 1 TABLET UNDER THE TONGUE EVERY 5 MINUTES AS  NEEDED FOR CHEST PAIN 75 tablet 1  . NON FORMULARY raisIns soaked in gin    . NON FORMULARY Take 1 capsule by mouth daily.    . Omega-3 Fatty Acids (FISH OIL) 1200 MG CAPS Take 1 capsule by mouth daily.    . saw palmetto 160 MG capsule Take 160 mg by mouth daily.     . Zinc 30 MG TABS Take 1 tablet by mouth daily.     No current facility-administered medications for this visit.     PAST MEDICAL HISTORY: Past Medical History:  Diagnosis Date  . CAD (coronary  artery disease)    a. PCI in 1991 (in [place]); b. LHC 4/16: dLM 95%, severe ostial LAD and ostial CFX disease extending from the LM, severe oRI, pRCA 99%, EF 60% >> CABG  . Gilbert syndrome    elevated indirect bilirubin  . HLD (hyperlipidemia)   . HTN (hypertension)   . Inguinal hernia    R>L, easily reducible  . Memory loss   . PAF (paroxysmal atrial fibrillation) (Bradley Terrell)    post CABG >> converted to NSR with Amiodarone  . Renal cyst    left    PAST SURGICAL HISTORY: Past Surgical History:  Procedure Laterality Date  . cardiac atherectomy  '91   to LAD and RCA  . COLONOSCOPY  03/2009, 05/2014  . CORONARY ARTERY BYPASS GRAFT N/A 03/18/2015   Procedure: CORONARY ARTERY BYPASS GRAFTING (CABG) x  four, LIMA to LAD, SVG-Ramus-dCx, SVG - dRCA using left internal mammary artery and right leg thigh & calf greater saphenous vein harvested endoscopically;  Surgeon: Bradley Isaac, MD;  Location: [facility];  Service: Open Heart Surgery;  Laterality: N/A;  . ENDOVEIN HARVEST OF GREATER SAPHENOUS VEIN Right 03/18/2015   Procedure: ENDOSCPOIC HARVEST OF RIGHT GREATER SAPHENOUS VEIN;  Surgeon: Bradley Isaac, MD;  Location: [facility];  Service: Open Heart Surgery;  Laterality: Right;  . INTRAOPERATIVE TRANSESOPHAGEAL ECHOCARDIOGRAM N/A 03/18/2015   Procedure: INTRAOPERATIVE TRANSESOPHAGEAL ECHOCARDIOGRAM;  Surgeon: Bradley Isaac, MD;  Location: [facility];  Service: Open Heart Surgery;  Laterality: N/A;  . LEFT HEART CATHETERIZATION WITH CORONARY ANGIOGRAM N/A 03/18/2015   Procedure: LEFT HEART CATHETERIZATION WITH CORONARY ANGIOGRAM;  Surgeon: Bradley Booze, MD;  Location[facility] C[facility] LAB;  Service: Cardiovascular;  Laterality: N/A;  . PRK  '96   (refractive eye surgery)  . TONSILLECTOMY  age 12    FAMILY HISTORY: Family History  Problem Relation Age of Onset  . Cancer Mother        breast  . Hypertension Father   . Heart disease Father   . Heart attack Father   . Cancer Sister        pancreatic  cancer  . Heart disease Brother 40       CABG  . Cancer Brother        colon  . Colon cancer Brother        late 75's  . Cancer Son 74       colon cancer, recurred at 25 (metastatic)  . Colon cancer Son   . Cancer Brother 45       lung cancer, smoker  . Parkinson's disease Sister   . Hypertension Sister   . Cancer Other        pancreatic  .  Heart attack Paternal Grandfather   . Diabetes Neg Hx     SOCIAL HISTORY:  Social History   Social History  . Marital status: Married    Spouse name: N/A  . Number of children: 3  . Years of education: 19 years   Occupational History  . 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 Smoking status: Never Smoker  . Smokeless tobacco: Never Used  . Alcohol use 0.0 oz/week     Comment: 2 light beers daily  . Drug use: No  . Sexual activity: Not on file   Other Topics Concern  . Not on file   Social History Narrative   Married, 1 dog, 1 cat.  Son in [place] (has terminal colon cancer)--passed away 12-22-2015, 3 in [place]. 7 grandchildren, 2 great-grandchildren   Right-handed.   1 cup coffee per day.   Lives at home with his wife.     PHYSICAL EXAM   Vitals:   08/06/17 1003  Weight: 165 lb (74.8 kg)  Height: 5\' 11"  (1.803 m)    Not recorded      Body mass index is 23.01 kg/m.  PHYSICAL EXAMNIATION:  Gen: NAD, conversant, well nourised, obese, well groomed                     Cardiovascular: Regular rate rhythm, no peripheral edema, warm, nontender. Eyes: Conjunctivae clear without exudates or hemorrhage Neck: Supple, no carotid bruits. Pulmonary: Clear to auscultation bilaterally   NEUROLOGICAL EXAM:  MMSE - Mini Mental State Exam 08/06/2017 06/05/2017  Orientation to time 3 1  Orientation to Place 5 5  Registration 3 3  Attention/ Calculation 5 5  Recall 0 2  Language- name 2 objects 2 2  Language- repeat 1 1  Language- follow 3 step command 3 3  Language- read & follow  direction 1 1  Write a sentence 1 1  Copy design 1 1  Copy design-comments - drew 1 pentagon and 1 hexagon, intersecting  Total score 25 25  Animal naming 13   CRANIAL NERVES: CN II: Visual fields are full to confrontation. Fundoscopic exam is normal with sharp discs and no vascular changes. Pupils are round equal and briskly reactive to light. CN III, IV, VI: extraocular movement are normal. No ptosis. CN V: Facial sensation is intact to pinprick in all 3 divisions bilaterally. Corneal responses are intact.  CN VII: Face is symmetric with normal eye closure and smile. CN VIII: Hearing is normal to rubbing fingers CN IX, X: Palate elevates symmetrically. Phonation is normal. CN XI: Head turning and shoulder shrug are intact CN XII: Tongue is midline with normal movements and no atrophy.  MOTOR: There is no pronator drift of out-stretched arms. Muscle bulk and tone are normal. Muscle strength is normal.  REFLEXES: Reflexes are 2+ and symmetric at the biceps, triceps, knees, and ankles. Plantar responses are flexor.  SENSORY: Intact to light touch, pinprick, positional sensation and vibratory sensation are intact in fingers and toes.  COORDINATION: Rapid alternating movements and fine finger movements are intact. There is no dysmetria on finger-to-nose and heel-knee-shin.    GAIT/STANCE: Posture is normal. Gait is steady with normal steps, base, arm swing, and turning. Heel and toe walking are normal. Tandem gait is normal.  Romberg is absent.   DIAGNOSTIC DATA (LABS, IMAGING, TESTING) - I reviewed patient records, labs, notes, testing and imaging myself where available.   ASSESSMENT AND PLAN  JAHRON HUNSINGER is a 76 y.o. 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 Ph.D.  [facility] [address] [place], [place] [address] Ph: [phone] Fax: [phone]  HF:WYOVZ, Tera Helper, MD

## 2017-08-14 ENCOUNTER — Ambulatory Visit
Admission: RE | Admit: 2017-08-14 | Discharge: 2017-08-14 | Disposition: A | Discharge status: Home / Self Care | Payer: Medicare Other | Source: Ambulatory Visit | Attending: Neurology | Admitting: Neurology

## 2017-08-14 DIAGNOSIS — R413 Other amnesia: Secondary | ICD-10-CM | POA: Diagnosis not present

## 2017-09-17 ENCOUNTER — Other Ambulatory Visit: Payer: Self-pay | Admitting: Family Medicine

## 2017-09-24 ENCOUNTER — Other Ambulatory Visit: Payer: Self-pay | Admitting: Medical

## 2017-09-24 MED ORDER — DONEPEZIL HCL 10 MG PO TABS: 10.0000 mg | ORAL_TABLET | Freq: Every day | ORAL | 0 refills | Status: DC

## 2017-09-25 MED ORDER — DONEPEZIL HCL 10 MG PO TABS: 10.0000 mg | ORAL_TABLET | Freq: Every day | ORAL | 4 refills | Status: DC

## 2017-10-14 ENCOUNTER — Other Ambulatory Visit: Payer: Self-pay | Admitting: Interventional Cardiology

## 2017-10-23 ENCOUNTER — Telehealth: Payer: Self-pay | Admitting: Family Medicine

## 2017-10-23 NOTE — Telephone Encounter (Signed)
Combined call with Bradley Terrell and his sister Bradley Terrell, in [place]. 40 minute total time spent on call.  They are both very concerned that their father is rapidly declining.  He called his son the other morning at around 10am--saying that he lost his confidence, scared all the time, wanted son to drop everything and move him right away. (they had discussed pt and wife moving to [place] to be near their children, but they have been hesitant to sell their house and do so).  Katie spoke to him later in the evening, around [employer] time, and neither him or their mom remembered the conversation. He got angry, called them liars.  Multiple flip-flops during the conversation, being angry and in denial, and asking for help.  There is the potential that alcohol is involved in some of the behavior (they are known to drink together).    They are concerned because sometimes at the end of a phone call, he doesn't turn off the phone.  They wonder what else he isn't turning off.  Once he didn't turn off the grill when he went to visit them in [place].  So far, they haven't had any issues with wandering, leaving appliances on that they are aware of, but they are worried that it is possible.  His wife accompanies him to his visits, and both children report that their  Mom is just as bad as he is.  She also didn't recall the conversation where he asked for their help in moving to [place], and she was also on the call.  Her memory is just as bad as his, per the children.  I personally experienced this, when he came for his last visit with a concern over medication, and when they were here, nobody knew why.  Apparently it was the wife who had called to schedule the appointment, but didn't remember doing so.  Bradley Terrell (daughter) states she found a [address]+ apartment within a mile from her that she was hoping they would move to.  He stated he would never live with either of his kids, would want to live on his own; Bradley Terrell feels this is close enough that  she could check in on him frequently (and assist with medications, check on safety, any other issues).  They are very worried about the both of them living on their own, when they have been getting more irritable, angry, belligerent (Lex--his wife is not, but backs him up, very pleasant, with equally poor memory per kids).  That he has been calling stating he is scared and asking for them to come ASAP (and they live in [place]), worries them, especially when he doesn't recall saying this.  Worried they will fly here to help him move, and he will refuse.  Wondering if a welfare check would be reasonable  Looked at chart, to see if possibly he wasn't taking his meds as reason for decline.  Looks like it was filled 7/31 with 1 refill, and then most recently on 10/17--so a couple of weeks behind in filling.  I'm sending this note to Dr. Krista Blue-- Dr. Krista Blue saw him for his dementia on 8/28 and has f/u scheduled with him 11/28.  I want Dr. Krista Blue to be aware of these concerns prior to his visit, as this patient easily puts a smile on and says everything is fine and can be convincing, and needs to know the background and family concerns.  Feel free to contact me (Dr. Tomi Bamberger) or Bradley Terrell (he is on  his HIPPA) to discuss further.  Sounds as though there may be some moments of clarity and insight, with significant anxiety, that may need to be addressed.  Bradley Terrell and sister Bradley Terrell are open to any suggestions. They feel somewhat helpless being in [place], and really want their parents to move closer so they can help care for them.  This had been the original plan, but they no longer seem to be in a rush to try and sell the house and move, which concerns their children.  Thanks in advance, Dr. Krista Blue, for your assistance. Feel free to call me on my cell [phone]) with any concerns or suggestions, and I hope that you can help come up with a good plan when you see him in 2 weeks. Please be aware that his wife's memory seems to be equally  as bad as the patients.  I do not know if she has gotten this Felix Pacini is not my patient, and I've only met her the last few visits.

## 2017-10-23 NOTE — Telephone Encounter (Signed)
pts son(nick) called and would like you to give him a call he has some concerns about him, states he has noticed some memory issues and personalities changes, states he has talked to you before about him, he can be reached at [phone], he is on his hippa informed him he would pro be after 5 this afternoon,

## 2017-11-06 ENCOUNTER — Telehealth: Payer: Self-pay | Admitting: *Deleted

## 2017-11-06 ENCOUNTER — Ambulatory Visit: Payer: Medicare Other | Admitting: Neurology

## 2017-11-06 NOTE — Telephone Encounter (Signed)
Please offer up Vickie or Shane--since I will be on vacation and don't have the availability right now. I am very concerned about his wife's memory, and heard their children express concern over safety issues in the home.  He is seeing neuro for dementia, and it may be that she needs meds as well.  I know nothing about her medical history.  Whoever sees her definitely needs records form prior provider before seeing her, because they are NOT good historians.  Reassure them that I am here as back-up and happy to help address concerns with the NP/PA that is seeing her

## 2017-11-06 NOTE — Telephone Encounter (Signed)
Patient is working with his daughter in trying to relocate. Called to see if you would see his wife, Demir Titsworth until they leave. She has the same insurance as him. She was seeing Dr. Harlan Stains but is no longer seeing her. States that something happened that left her with an "uncomfortable feeling," and does not want to be seen there any longer. Is also having some dementia issues. Please advise, thanks.

## 2017-11-06 NOTE — Telephone Encounter (Signed)
No showed follow up appointment. 

## 2017-11-07 ENCOUNTER — Encounter: Payer: Self-pay | Admitting: Neurology

## 2017-11-19 ENCOUNTER — Telehealth: Payer: Self-pay | Admitting: Family Medicine

## 2017-11-19 NOTE — Telephone Encounter (Signed)
Pt son Hart Carwin called.  ( he is on HIPAA).  Hart Carwin asked to speak with you, I advised him I could take a message.  He wanted to know what appt he had missed and I told him that he missed the Neuro appt.  Son will call and reschedule.  He also wanted to know what meds and I have emailed him a list to [email or web address]

## 2017-11-19 NOTE — Telephone Encounter (Signed)
This is a HUGE problem that he missed this appointment.  I had contacted the neuro regarding concerns the children were having, worsening dementia in both the patient AND his wife.  He needs to be seen, needs to reschedule. I will cc this to his neuro as FYI.

## 2017-11-20 ENCOUNTER — Telehealth: Payer: Self-pay | Admitting: Neurology

## 2017-11-20 ENCOUNTER — Encounter: Payer: Self-pay | Admitting: *Deleted

## 2017-11-20 DIAGNOSIS — F0281 Dementia in other diseases classified elsewhere with behavioral disturbance: Secondary | ICD-10-CM

## 2017-11-20 DIAGNOSIS — G309 Alzheimer's disease, unspecified: Principal | ICD-10-CM

## 2017-11-20 NOTE — Telephone Encounter (Signed)
Two attempts made to reach patient's son today.  Left message requesting a return call.

## 2017-11-20 NOTE — Telephone Encounter (Signed)
We have not been able to reach the patient's son after multiple attempts.  Dr. Krista Blue is happy to work the patient into her schedule. Also, the patient was called to see if he would like to reschedule the appointment.  He said "No, I will not" and the call was disconnected from his end.

## 2017-11-20 NOTE — Telephone Encounter (Signed)
Attempted to reach patient's son again this afternoon. No answer.  Left message requesting a return call.

## 2017-11-20 NOTE — Telephone Encounter (Signed)
Multiple attempts to contact his son Merrilee Seashore by [phone] without success.  Please help me getting hold of his son, he has significant memory loss, brain atrophy on MRI scan, no treatable etiology found on lab evaluations.  MMSE 25/30 on August 06 2017, missed follow up appt on Nov28 2018

## 2017-11-20 NOTE — Telephone Encounter (Addendum)
Pt's son Hart Carwin [phone]) called said the pt told him the clinic had c/a the appt and not given him another appt. The said he does not need to go bc he can tye his own shoes. The son is wanting to go over some things he has noticed over the past 5 days. He will not be available today as he boarding a place to head back home on the [facility]. When calling a msg can be on his VM

## 2017-11-20 NOTE — Telephone Encounter (Signed)
I was able to talk with his son Bradley Terrell, he noticed gradual worsening memory trouble on patient,  he call his son often, begging him for help, "I am scared", he is worried that everybody is trying to screw him up, he can be very aggressive sometimes, he is very controlling of his mother. He was not able to eat well, could not take care of his wife.  After discussion, we decided to refer him to neuropsychiatric evaluation for competence evaluation.  Sharyn Lull, please call his son for neuropsychiatric appointment

## 2017-11-21 NOTE — Telephone Encounter (Signed)
Forwarding to Dr.Knapp. 

## 2017-11-22 NOTE — Telephone Encounter (Signed)
I have called the son and left him a message relaying that I was not sure when apt would be because Maryanna Shape calls and schedule's the apt. I left my telephone number and Maryanna Shape for him to call back.

## 2017-11-25 NOTE — Telephone Encounter (Signed)
Spoke to [place], at [phone]  And Dr. Tawny Asal and Dr. Vikki Ports 's apt's were Booked out so far I told him about Dr. Beverly Gust and he wanted me to send referral there. Referral has been sent there for a sooner apt. Nicki Reaper has information as well scott will touch back with me if he needs' to.

## 2017-12-16 ENCOUNTER — Telehealth: Payer: Self-pay | Admitting: Family Medicine
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
## 2017-12-16 NOTE — Telephone Encounter (Signed)
Pt's son, Hart Carwin, called requesting that Dr. Tomi Bamberger call his sister Valetta Fuller to go over some changes and concerns that they have noticed with the patient. Valetta Fuller can be reached at [phone]

## 2017-12-17 NOTE — Telephone Encounter (Signed)
Called pt and reminded him of his appt tomorrow. Pt confirmed he will be here. Pt was informed that call back system has been giving [place] issues and I was making manual phone calls.

## 2017-12-17 NOTE — Progress Notes (Signed)
Chief Complaint  Patient presents with  . Dementia    follow up. No concerns.    Patient presents for med check/follow-up. He was actually scheduled for next month, but son called to make this appointment (he was unaware of this change) ,due to concerns--see detailed phone encounter from 2 days ago.  He denies complaints, feels great. We discussed that plans per last visit was to slowly go through his house, sell it and move to [place]. He reports that his plans changed from last visit--prefers to stay here (here since 2003, and loves it); doesn't want to move to [place]. He feels his kids continue to "press us"--what they want is more important than what he and his wife want.  He is quite sure that he does not want to move. Discussed his (and his wife's) memory issues, overall aging issues and the benefits of having family nearby, vs other people who can help if various situations arise (per daughter, they do not have supportive friends/people who can help should something happen).   His wife reports that their daughter Joellen Jersey helps looks out for them, calls to make sure that she they are taing their meds, is "our caregiver"--lives in [place]. Either Joellen Jersey or Merrilee Seashore (son) calls most days.  Cognitive impairment.  He was started on Aricept in the end of June. MMSE was 25/30 prior to starting medication.  (1/5 points for orientation, stating it was Thurs, 14th of July, 2017, summer; Only other error was in his drawing (drew intersecting pentagon with hexagon) Clock drawing abnormal--clock face only had 8 numbers.)    He denies any side effects. He was referred to neurologist, saw Dr. Krista Blue in August.  MRI was performed 08/2017, showing age-related changes: IMPRESSION:  Abnormal MRI brain (without) demonstrating: 1. 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 2. 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  3. No acute findings.  He no-showed his 3 month follow-up visit  with neuro.  Based on concerns for the family, he was referred for neuropsych testing, which is scheduled for the end of the month. He is also scheduled for f/u with neuro later this month.  He states today that he truly cannot recall whether or not he is taking his aricept--he believes he is taking all prescribed medications, but cannot verify by name (since he takes "so many"). He has all his bottles lined up at home, and fills his pill box, and when low, brings to the pharmacy.  He feels sure that he is taking all medications correctly.  HTN: He no longer records the values, but still checks regularly. He recalls values are "okay". Denies cough, headaches, dizziness, chest pain, shortness of breath. He continues to walk 5 days/week, 25 minutes, very briskly.  CAD: He had bypass surgery in April 2016 and is doing well. No longer having any exertional dyspnea. Denies chest pain, DOE.  He last saw Dr. Irish Lack in 01/2017, with f/u recommended in 1 year.  He does not have this follow-up visit scheduled for 01/2018.  Hyperlipidemia follow-up: Compliant with medications and denies medication side effects. His Vytorin was changed to Lipitor 80mg  in the Spring of 2015 by Dr. Irish Lack due to cost.He denies any side effects. He is not fasting today. Lab Results  Component Value Date   CHOL 137 06/10/2017   HDL 59 06/10/2017   LDLCALC 61 06/10/2017   TRIG 85 06/10/2017   CHOLHDL 2.3 06/10/2017   Gout: Hasn't had any flares. Remains on uloric. (He  is no longer on HCTZ. Previously discussed trial off Uloric in the past, but he declined--doing well without any gout flares or side effects).  Impaired fasting glucose.  Last A1c was normal in June. Tries to limit sweets, carbs  He reports his weight loss has been intentional.  Wife retired. They are eating less overall.   10-15# weight loss in the last 6 months. He reports he "eats a good breakfast and dinner, sandwich for  lunch".  Breakfast--Shredded wheat with raisins Lunch--sandwich (salami, sometimes with cheese) Dinner--pork chops, chicken, meat loaf, or country-style steak, with side of vegetables.  1/2 miller lite up to 2x/day (1 beer/day). Occasional bourbon and ginger after dinner a few times/week  This is in contrast to what their children reported, regarding their dietary intake and alcohol intake--see recent phone call.  PMH, PSH, SH reviewed  Outpatient Encounter Medications as of 12/18/2017  Medication Sig Note  . amLODipine (NORVASC) 5 MG tablet TAKE ONE TABLET BY MOUTH DAILY   . Apoaequorin (PREVAGEN PO) Take 1 tablet by mouth daily.   Marland Kitchen aspirin 81 MG EC tablet Take 1 tablet (81 mg total) by mouth daily.   Marland Kitchen atorvastatin (LIPITOR) 80 MG tablet TAKE ONE TABLET BY MOUTH DAILY   . Biotin 5000 MCG CAPS Take 1 capsule by mouth daily. Reported on 06/25/2016   . Calcium Carbonate-Vit D-Min (CALCIUM 600+D PLUS MINERALS) 600-400 MG-UNIT TABS Take 1 tablet by mouth daily.     . carvedilol (COREG) 6.25 MG tablet TAKE ONE TABLET BY MOUTH TWICE A DAY WITH A MEAL   . donepezil (ARICEPT) 10 MG tablet Take 1 tablet (10 mg total) by mouth at bedtime. 12/18/2017: Unsure if he is taking  . febuxostat (ULORIC) 40 MG tablet Take 1 tablet (40 mg total) by mouth daily.   . Glucosamine 500 MG CAPS Take 1 capsule by mouth daily. Reported on 06/25/2016   . lisinopril (PRINIVIL,ZESTRIL) 20 MG tablet TAKE ONE TABLET BY MOUTH TWICE A DAY   . Misc Natural Products (LUTEIN 20 PO) Take 1 capsule by mouth daily.   . Multiple Vitamins-Minerals (CENTRUM SILVER PO) Take 1 tablet by mouth daily.     . NON FORMULARY raisIns soaked in gin 05/26/2015: .  Marland Kitchen NON FORMULARY Take 1 capsule by mouth daily. 06/25/2016: Contains: Vitamin D 5,000iu, Vit K2 59mcg, Mg 125mg , zinc 70mcg  . Omega-3 Fatty Acids (FISH OIL) 1200 MG CAPS Take 1 capsule by mouth daily.   . saw palmetto 160 MG capsule Take 160 mg by mouth daily.    . Zinc 30 MG TABS  Take 1 tablet by mouth daily.   . nitroGLYCERIN (NITROSTAT) 0.4 MG SL tablet DISSOLVE 1 TABLET UNDER THE TONGUE EVERY 5 MINUTES AS  NEEDED FOR CHEST PAIN (Patient not taking: Reported on 12/18/2017)    No facility-administered encounter medications on file as of 12/18/2017.    No Known Allergies  ROS: no fever, chills, headaches, dizziness, syncope, chest pain, shortness of breath, URI symptoms, GI or GU complaints, bleeding, bruising, rash, joint pains, depression, fatigue or any other complaints.   PHYSICAL EXAM:  BP 114/72   Pulse 76   Ht 5\' 11"  (1.803 m)   Wt 158 lb 12.8 oz (72 kg)   BMI 22.15 kg/m   Wt Readings from Last 3 Encounters:  12/18/17 158 lb 12.8 oz (72 kg)  08/06/17 165 lb (74.8 kg)  07/31/17 166 lb (75.3 kg)    MMSE 24/30 11th of January 2019, winter. Thursday (wed 1/9)  Serial 7's: 100, 93, 86, 82, 78, 74 (changed to -4 mid way through) Recalled 1/3 objects  Clock drawing--labeled the times (5, 10, 15, etc rather than 12, 1, 2, 3);  When asked what clock actually looked like, not the time it meant, he drew another clockface--where he put a "12" at top, but then repeated the same drawing (5-55, not 1-12).  No hands on the clock.  Well developed, pleasant male, in good spirits, very pleasant and seemingly enamored with his wife, who is also present (children describe him being very controlling with her, per phone call).  He has body odor, but is shaven today HEENT: PERRL, EOMI, conjunctiva clear. OP normal Neck: no lymphadenopathy, thyromegaly or carotid bruit Heart: regular rate and rhythm, no murmur Chest: nontender, no mass Lungs: clear bilaterally Back: no CVA or spinal tenderness Abdomen: soft, nontender, no organomegaly or mass Extremities: no clubbing, cyanosis or edema; 2+ pulses Skin: no rashes/lesions Neuro: alert, cranial nerves grossly intact; normal strength, gait Psych: normal mood, affect, grooming, +body odor  ASSESSMENT/PLAN:  Cognitive  impairment - slight decline. Encouraged f/u with Dr. Krista Blue and neuropsych testing, continue with aricept  Essential hypertension, benign - controlled - Plan: Comprehensive metabolic panel  Pure hypercholesterolemia - at goal per last check, continue statin (not fasting today)  Coronary artery disease involving native coronary artery of native heart without angina pectoris - asymptomatic, stable  Chronic gout without tophus, unspecified cause, unspecified site - controlled with uloric, prefers to continue (rather than trial at lowering dose or without med)  Medication monitoring encounter - Plan: Uric acid, Comprehensive metabolic panel  Screening for prostate cancer - Plan: PSA  Impaired fasting glucose - continue proper diet, regular exercise - Plan: Hemoglobin A1c, Comprehensive metabolic panel  Essential hypertension, benign - Plan: Comprehensive metabolic panel   No fasting today-- Do all labs, but not lipids.  Okay (at goal ) in July.    When you get home, please look at your bottles, and verify that you are taking Donepezil (aricept).  Please Call Dr. Hassell Done office to schedule your yearly check-up (due in February).  Cancel Feb visit (addressed today instead) Continue May viist (physical) Labs prior--only lipids needed unless abnormal today  There is concern regarding this couple living at home--? Eating properly (ongoing weight loss), drinking alcohol (per children), driving with expired license (per family), some smoke detectors not working (per family). They do not seem willing to move to [place], are happy here.  Family reports they don't have friends or other supportive people to help them in case of need. We spent considerable time discussing safety issues that can come up (related to dementia, aging/illness in general), benefits of being near family. Discussed from children's perspective (acting out of concern, fear, not trying to anger him, trying to let them keep their  independence). They are willing to have nurse come out to home and assess/help. He recalls having a nurse come once yearly (likely through his insurance), and he suggested being open to having someone come back.  Discussed importance of f/u with neuro, and for neuropsych eval--discussed this can help determine proper treatments to keep him independent, and to provide support of his competence (vs rule it out, but did not discuss in that way), to reassure his family. (as stated that I would propose in this manner to his children).  Sending copy of note FYI to Dr. Levander Campion him later this month.  50 min visit today, plus 40 mins on phone with family  prior to visit.

## 2017-12-18 ENCOUNTER — Telehealth: Payer: Self-pay | Admitting: Family Medicine

## 2017-12-18 ENCOUNTER — Ambulatory Visit: Payer: Medicare Other | Admitting: Family Medicine

## 2017-12-18 ENCOUNTER — Encounter: Payer: Self-pay | Admitting: Family Medicine

## 2017-12-18 VITALS — BP 114/72 | HR 76 | Ht 71.0 in | Wt 158.8 lb

## 2017-12-18 DIAGNOSIS — M1A9XX0 Chronic gout, unspecified, without tophus (tophi): Secondary | ICD-10-CM

## 2017-12-18 DIAGNOSIS — R7301 Impaired fasting glucose: Secondary | ICD-10-CM

## 2017-12-18 DIAGNOSIS — R4189 Other symptoms and signs involving cognitive functions and awareness: Secondary | ICD-10-CM | POA: Diagnosis not present

## 2017-12-18 DIAGNOSIS — E7800 Pure hypercholesterolemia, unspecified: Secondary | ICD-10-CM

## 2017-12-18 DIAGNOSIS — Z125 Encounter for screening for malignant neoplasm of prostate: Secondary | ICD-10-CM

## 2017-12-18 DIAGNOSIS — I10 Essential (primary) hypertension: Secondary | ICD-10-CM

## 2017-12-18 DIAGNOSIS — Z5181 Encounter for therapeutic drug level monitoring: Secondary | ICD-10-CM

## 2017-12-18 DIAGNOSIS — I2510 Atherosclerotic heart disease of native coronary artery without angina pectoris: Secondary | ICD-10-CM | POA: Diagnosis not present

## 2017-12-18 NOTE — Patient Instructions (Addendum)
  When you get home, please look at your bottles, and verify that you are taking Donepezil (aricept).  Please Call Dr. Hassell Done office to schedule your yearly check-up (due in February).  Your mini-mental status score dropped slightly from the last visit, now 24/30. It is very important that you follow up (as scheduled for later this month) with Dr. Krista Blue, to address your medications and whether or not they need to be changed. As we discussed, it is also important to keep the neuropsych testing appointment that is scheduled.  We will try and arrange for home health nurse visits.  Return in May for your wellness exam, sooner if any concerns.  You have continued to lose weight--please be sure to continue to eat a healthy, well-rounded diet, and not continue to lose any weight.  Your weight is currently healthy.

## 2017-12-19 LAB — HEMOGLOBIN A1C
HEMOGLOBIN A1C: 5.2 %{Hb} (ref <5.7)
MEAN PLASMA GLUCOSE: 103 (calc)
eAG (mmol/L): 5.7 (calc)

## 2017-12-19 LAB — COMPREHENSIVE METABOLIC PANEL
AG RATIO: 1.7 (calc) (ref 1.0–2.5)
ALT: 21 U/L (ref 9–46)
AST: 24 U/L (ref 10–35)
Albumin: 4.3 g/dL (ref 3.6–5.1)
Alkaline phosphatase (APISO): 58 U/L (ref 40–115)
BUN: 12 mg/dL (ref 7–25)
CO2: 27 mmol/L (ref 20–32)
CREATININE: 1.12 mg/dL (ref 0.70–1.18)
Calcium: 9.5 mg/dL (ref 8.6–10.3)
Chloride: 102 mmol/L (ref 98–110)
GLUCOSE: 101 mg/dL — AB (ref 65–99)
Globulin: 2.5 g/dL (calc) (ref 1.9–3.7)
Potassium: 4.4 mmol/L (ref 3.5–5.3)
SODIUM: 138 mmol/L (ref 135–146)
TOTAL PROTEIN: 6.8 g/dL (ref 6.1–8.1)
Total Bilirubin: 1.4 mg/dL — ABNORMAL HIGH (ref 0.2–1.2)

## 2017-12-19 LAB — PSA: PSA: 1.8 ng/mL (ref <=4.0)

## 2017-12-19 LAB — URIC ACID: URIC ACID, SERUM: 3.5 mg/dL — AB (ref 4.0–8.0)

## 2017-12-19 NOTE — Telephone Encounter (Signed)
Pt's son, Nicholas,called requesting that Dr. Tomi Bamberger call him to update him on how pt's appointment yesterday. Hart Carwin can be reached at [phone]

## 2017-12-19 NOTE — Telephone Encounter (Signed)
Please advise him that I spoke with Joellen Jersey (his sister) yesterday and gave update to her.  I'm happy to call him if/when I get a chance, if he still needs me to, but I want to make sure he spoke to her.  We will be trying to set up some home health/SW eval--he was very open to having this done.  He was encouraged to f/u as scheduled with neuro and testing.

## 2017-12-25 ENCOUNTER — Telehealth: Payer: Self-pay | Admitting: *Deleted

## 2017-12-25 NOTE — Telephone Encounter (Signed)
Adacia with [facility] stopped by to let you know that patient refused starter care visit and stated that "he is a healthy man and he does not need ANY healthcare at this time."

## 2017-12-25 NOTE — Telephone Encounter (Signed)
Please call him--remind him that we had discussed this at his visit, and he was the one who suggested it (stating someone used to come once/year, and if someone could come even more often, that would be great).  We can't say what exactly they can do for him, until they can assess if there are any needs.  Once they do the initial assessment, it can be clearer if there are any recommendations or ongoing needs.  Remind him that this was to have an outside eye looking in, to make sure that there are no safety issues, or things that could be made easier for him.  Having another set of eyes on them (and ensuring a safe home with whatever medical/safety needs being met) will reassure the kids who can't pop in to do it themselves. If you are able to discuss with him and he seems interested (it was actually his suggestion at our visit), then call [facility] back.  Thanks

## 2017-12-25 NOTE — Telephone Encounter (Signed)
Spoke with patient and he said that he walked them through his house and showed them that everything is okay. He said that he had visiting nurses in the past that came once a year and that is all that he is agreeable to. He will not have someone coming in once a week, once a month or any of that. There is no need for more than once a year-no need to call them back per him. He also once again explained that it would be best for he and Blanch Media to have the same doctor so he would like to get all the records from Dr.White and transfer her to you. I explained to him that we had already asked you and due to the fact that Blanch Media was uninsured and that Dr.Knapp was not taking new patients, she should remain with Dr.White.

## 2018-01-01 ENCOUNTER — Ambulatory Visit: Payer: Medicare Other | Admitting: Neurology

## 2018-01-30 ENCOUNTER — Other Ambulatory Visit: Payer: Self-pay | Admitting: Interventional Cardiology

## 2018-02-03 ENCOUNTER — Other Ambulatory Visit: Payer: Medicare Other

## 2018-02-05 ENCOUNTER — Encounter: Payer: Medicare Other | Admitting: Family Medicine

## 2018-02-19 ENCOUNTER — Telehealth: Payer: Self-pay | Admitting: *Deleted

## 2018-02-19 NOTE — Telephone Encounter (Signed)
Unfortunately, I cannot take her on as a patient. Let him know that it is not simply due to insurance, it is because I am not taking new patients, and hardly can get my own patients in to see me (we have been short-staffed as well).  If it is Dr. Dema Severin through [facility], I know her well and trust her--I hope that can make him feel more comfortable.

## 2018-02-19 NOTE — Telephone Encounter (Signed)
Patient called and said his wife, Blanch Media is not doing very well. Saw Dr. Dema Severin last week but really wants to bring her here to see you. She isn't really talking much or wanting to do anything He longer trusts Dr.White and only trusts you and would like to bring her in here. I explained to him as I have several times in the past that you are not taking new patients (and she in uninsured). He asked me to please ask you once again as a favor to him.

## 2018-02-19 NOTE — Telephone Encounter (Signed)
Spoke with patient and he would like to know if you have a recommendation for a PCP for his wife.

## 2018-02-19 NOTE — Telephone Encounter (Signed)
Would he feel comfortable with her seeing another doctor in that practice? I think [facility] has good doctors. [facility] also has great doctors (and any records/labs, etc are shared between all [facility] practices, so no hassle in transferring records). If not, recommend internal medicine/geriatric practice that will accept her.

## 2018-02-20 NOTE — Telephone Encounter (Signed)
rec given.

## 2018-02-26 ENCOUNTER — Other Ambulatory Visit: Payer: Self-pay | Admitting: Interventional Cardiology

## 2018-03-07 ENCOUNTER — Other Ambulatory Visit: Payer: Self-pay | Admitting: Family Medicine

## 2018-03-07 NOTE — Telephone Encounter (Signed)
Please advise if donepezil can be filled at [facility]. [place]

## 2018-04-07 ENCOUNTER — Other Ambulatory Visit: Payer: Self-pay | Admitting: Interventional Cardiology

## 2018-04-14 ENCOUNTER — Other Ambulatory Visit: Payer: Medicare Other

## 2018-04-14 DIAGNOSIS — Z5181 Encounter for therapeutic drug level monitoring: Secondary | ICD-10-CM | POA: Diagnosis not present

## 2018-04-14 DIAGNOSIS — E7800 Pure hypercholesterolemia, unspecified: Secondary | ICD-10-CM | POA: Diagnosis not present

## 2018-04-14 NOTE — Addendum Note (Signed)
Addended by: Minette Headland A on: 04/14/2018 09:12 AM   Modules accepted: Orders

## 2018-04-15 LAB — LIPID PANEL
CHOLESTEROL TOTAL: 165 mg/dL (ref 100–199)
Chol/HDL Ratio: 2.2 ratio (ref 0.0–5.0)
HDL: 75 mg/dL (ref >39)
LDL Calculated: 64 mg/dL (ref 0–99)
Triglycerides: 128 mg/dL (ref 0–149)
VLDL Cholesterol Cal: 26 mg/dL (ref 5–40)

## 2018-04-16 ENCOUNTER — Telehealth: Payer: Self-pay | Admitting: Family Medicine

## 2018-04-16 ENCOUNTER — Ambulatory Visit: Payer: Medicare Other | Admitting: Family Medicine

## 2018-04-16 ENCOUNTER — Other Ambulatory Visit: Payer: Self-pay | Admitting: *Deleted

## 2018-04-16 DIAGNOSIS — F0391 Unspecified dementia with behavioral disturbance: Secondary | ICD-10-CM

## 2018-04-16 NOTE — Telephone Encounter (Signed)
Pt come in for his appt, and I informed him that he called yesterday to cancel his appt stated that he had to go to [place] to see his kids, and I asked him if he wanted to rescedule his appt and he would think about and he walked out,

## 2018-04-16 NOTE — Telephone Encounter (Signed)
LM at approx 1:30 with son, from phone number on last phone encounter Merrilee Seashore). Curious to see if this is true, if patient going to [place], since he didn't mention anything or cancel his appt when he was here 2 days ago for his labs. Will mention that staff mentioned he looked disheveled, poor hygiene, very thin.  Addendum--apparently pt actually came in at around 11 for visit (which was supposed to be later in the afternoon), not recalling that he called to cancel yesterday and the reason given.  Reviewed medications in his chart, and based on quantities and refills, looks like he may be out of some of his medications (not all), which is concerning.  He is past due to see his cardiologist, who was prescribing some of them.  Will try and get [facility] involved for assistance in this difficult patient with dementia, who is living on his own.

## 2018-04-17 ENCOUNTER — Other Ambulatory Visit: Payer: Self-pay

## 2018-04-17 ENCOUNTER — Other Ambulatory Visit: Payer: Self-pay | Admitting: Interventional Cardiology

## 2018-04-17 NOTE — Patient Outreach (Signed)
[facility]) Care Management  04/17/2018  Bradley Terrell 1941-08-29 [id number]   Medication Adherence call to Mr. Bradley Terrell patient is showing past due under [facility] Ins.on Lisinopril 20 mg spoke with patient he said he is now seen a new doctor(Knapp) and he still has medication but, he wants doctor Bradley Terrell to fill all his medications.Bradley Terrell will call the new doctor for more refills too.   [facility] Direct Dial [phone]  Fax [phone] [email or web address]

## 2018-04-21 ENCOUNTER — Other Ambulatory Visit: Payer: Self-pay | Admitting: Interventional Cardiology

## 2018-04-23 ENCOUNTER — Other Ambulatory Visit: Payer: Self-pay | Admitting: Interventional Cardiology

## 2018-04-28 ENCOUNTER — Other Ambulatory Visit: Payer: Self-pay

## 2018-04-28 ENCOUNTER — Telehealth: Payer: Self-pay | Admitting: *Deleted

## 2018-04-28 NOTE — Telephone Encounter (Signed)
Quinn Plowman, RN with [facility] did try to contact patient and he refused services. The encounter was documented by nurse Nyoka Cowden within the system if you would like to refer to it-just an FYI, thanks.

## 2018-04-28 NOTE — Patient Outreach (Signed)
[facility]) Care Management  04/28/2018  Bradley Terrell [birth year] [id number]   TELEPHONE SCREENING Referral date: 04/17/18 Referral source: primary MD Referral reason: consult patient with dementia, non compliant with taking meds. Needs help with hygiene, living on his own (concern for safety). May not even have all of his medications that he needs to be taking due to non compliance issues Insurance: [employer] Attempt #1  Telephone call to patient regarding regarding primary MD referral. Patient would not verify HIPAA. Patient states, " I have my two doctor's and I don't need your services." Patient hung up.  RNCM called patients primary MD office, Dr Tomi Bamberger and left voice message with Verdene Lennert regarding patients refusal of services. Requested assistance with engaging patient to [facility] care management services.   PLAN: RNCM will close patient due to refusal of services.   Quinn Plowman RN,BSN,CCM [facility] Telephonic  [phone]

## 2018-04-28 NOTE — Telephone Encounter (Signed)
Called [facility] and checked on status of referral, they told me that patient would be receiving a phone call from a nurse this week. And they would send the nurse a reminder.

## 2018-04-29 ENCOUNTER — Telehealth: Payer: Self-pay | Admitting: Family Medicine

## 2018-04-29 NOTE — Telephone Encounter (Signed)
Pt called requesting to speak to [place] about is upcoming appointment on 05/01/18 with Dr Tomi Bamberger

## 2018-04-29 NOTE — Progress Notes (Signed)
Chief Complaint  Patient presents with  . Hyperlipidemia    nonfasting med check, patient brought in his meds today. Looks like he is not have the amlodipine with him him today.   Patient presents for med check.  He was scheduled for a wellness visit/physical recently, but called the day prior to cancel, stating that he was going to [place] to see his son, but then showed up the day his visit was scheduled (a few hours earlier than original appt), and wasn't seen.  There are concerns about worsening cognitive impairment--noted to be unshaven, some body odor (and ? Alcohol?) when here for lab visit.  In review of his medication refills, there was concern that he may not be taking his medications appropriately.  Contacted [facility] to see if they could help, but he refused their assistance.  He canceled all f/u appointments with neuro, including his neuropsych testing. He states he didn't need it, he was fine. He repeated many times how he lines up his bottles, fills his pill box, and is insistent that he does his medications correctly.  He hasn't seen Dr. Irish Lack since 01/2017 (was due to f/u in 1 year). Review of his medications: Amlodipine was last refilled by Dr. Irish Lack 10/2017 for #90--he didn't even bring this bottle with him today, hasn't beeen taking  Carvedilol was last filled in 07/2017 for 6 months (#60, 5 RF)--he brings in bottle dated 11/2017, which is not empty.  Dr. Irish Lack did just refill #30 of lipitor and lisinopril earlier this month, and told to schedule f/u appt (which he has not yet done).  Aricept last filled #90 3/30 uloric written for a year 05/10/17   Cognitive impairment. He was started on Aricept the end of June 2018. MMSE was 25/30 prior to starting medication. (1/5 points for orientation, stating it was Thurs, 14th of July, 2017, summer; Only other error was in his drawing (drew intersecting pentagon with hexagon) Clock drawing abnormal--clock face only had 8 numbers.)   He  reports compliance in taking medication, denies side effects.  He saw Dr. Krista Blue in August.  MRI was performed 08/2017, showing age-related changes: IMPRESSION:  Abnormal MRI brain (without) demonstrating: 1. 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 2. 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  3. No acute findings.  He no-showed his 3 month follow-up visit with neuro.  Based on concerns for the family, he was referred for neuropsych testing, which he canceled, along with all neuro f/u appts (doesn't need to see them because he is fine, he takes his meds).  HTN: He no longer records the values, but still checks regularly. He recalls values are "okay". Can't recall any values, just repeats "they are okay".  While I was checking his BP he states that he recalls his BP that morning was 737 systolic. Denies cough, headaches, dizziness, chest pain, shortness of breath. He continues to walk daily 15-20 minutes, twice daily, very briskly, with his wife.  When asked what he does when the weather is bad, they answered that they walk inside.  He had c-met in January, normal other than fglu 101 (with normal A1c).  CAD: He had bypass surgery in April 2016 and is doing well. No longer having any exertional dyspnea. Denies chest pain, DOE. He last saw Dr. Irish Lack in 01/2017, with f/u recommended in 1 year.  He does not have any follow-up scheduled with him.  Hyperlipidemia follow-up: Compliant with medications and denies medication side effects. His Vytorin  was changed to Lipitor '80mg'$  in the Spring of 2015 by Dr. Irish Lack due to cost.He denies any side effects.  He had lipids checked earlier this month: Lab Results  Component Value Date   CHOL 165 04/14/2018   HDL 75 04/14/2018   LDLCALC 64 04/14/2018   TRIG 128 04/14/2018   CHOLHDL 2.2 04/14/2018    Gout: Hasn't had any flares. Remains on uloric.(He is no longer on HCTZ.Previously discussed  trialoff Uloric in the past, but he declined--doing well without any gout flaresorside effects). Lab Results  Component Value Date   LABURIC 3.5 (L) 12/18/2017    Impaired fasting glucose. Tries to limit sweets, carbs.   Lab Results  Component Value Date   HGBA1C 5.2 12/18/2017   PMH, PSH, SH reviewed He reports currently only having 1 [employer] daily, denies other alcohol, no longer has bourbon.  Outpatient Encounter Medications as of 05/01/2018  Medication Sig Note  . atorvastatin (LIPITOR) 80 MG tablet TAKE ONE TABLET BY MOUTH DAILY. **PLEASE SCHEDULE APPOINTMENT FOR FURTHER REFILLS**   . carvedilol (COREG) 6.25 MG tablet TAKE ONE TABLET BY MOUTH TWICE A DAY WITH A MEAL   . donepezil (ARICEPT) 10 MG tablet TAKE ONE TABLET BY MOUTH AT BEDTIME   . febuxostat (ULORIC) 40 MG tablet Take 1 tablet (40 mg total) by mouth daily.   Marland Kitchen lisinopril (PRINIVIL,ZESTRIL) 20 MG tablet TAKE ONE TABLET BY MOUTH TWICE A DAY **PLEASE MAKE APPOINTMENT FOR FURTHER REFILLS**   . NON FORMULARY raisIns soaked in gin 05/26/2015: .  Marland Kitchen amLODipine (NORVASC) 5 MG tablet Take 1 tablet (5 mg total) by mouth daily.   Marland Kitchen Apoaequorin (PREVAGEN PO) Take 1 tablet by mouth daily.   Marland Kitchen aspirin 81 MG EC tablet Take 1 tablet (81 mg total) by mouth daily. (Patient not taking: Reported on 05/01/2018)   . Biotin 5000 MCG CAPS Take 1 capsule by mouth daily. Reported on 06/25/2016   . Calcium Carbonate-Vit D-Min (CALCIUM 600+D PLUS MINERALS) 600-400 MG-UNIT TABS Take 1 tablet by mouth daily.     . Glucosamine 500 MG CAPS Take 1 capsule by mouth daily. Reported on 06/25/2016   . Misc Natural Products (LUTEIN 20 PO) Take 1 capsule by mouth daily.   . Multiple Vitamins-Minerals (CENTRUM SILVER PO) Take 1 tablet by mouth daily.     . nitroGLYCERIN (NITROSTAT) 0.4 MG SL tablet DISSOLVE 1 TABLET UNDER THE TONGUE EVERY 5 MINUTES AS  NEEDED FOR CHEST PAIN (Patient not taking: Reported on 12/18/2017)   . NON FORMULARY Take 1 capsule by mouth  daily. 06/25/2016: Contains: Vitamin D 5,000iu, Vit K2 69mg, Mg '125mg'$ , zinc 113m  . Omega-3 Fatty Acids (FISH OIL) 1200 MG CAPS Take 1 capsule by mouth daily.   . saw palmetto 160 MG capsule Take 160 mg by mouth daily.    . Zinc 30 MG TABS Take 1 tablet by mouth daily.   . [DISCONTINUED] amLODipine (NORVASC) 5 MG tablet TAKE ONE TABLET BY MOUTH DAILY (Patient not taking: Reported on 05/01/2018)   . [DISCONTINUED] lisinopril (PRINIVIL,ZESTRIL) 20 MG tablet TAKE ONE TABLET BY MOUTH TWICE A DAY **PLEASE MAKE APPOINTMENT FOR FURTHER REFILLS**    No facility-administered encounter medications on file as of 05/01/2018.    (see above for his meds; hasn't been taking amlodipine, and not likely taking coreg BID based on fill status).  No Known Allergies  ROS: no fever, chills, URI symptoms, headaches, dizziness, chest pain, shortness of breath, edema, bleeding, bruising, depression/anxiety, joint pain or other  concerns.  As he states at every single visit, "If I were any better, there would be two of me".   PHYSICAL EXAM:  BP (!) 150/100   Pulse 80   Ht '5\' 11"'$  (1.803 m)   Wt 156 lb 12.8 oz (71.1 kg)   BMI 21.87 kg/m   Wt Readings from Last 3 Encounters:  05/01/18 156 lb 12.8 oz (71.1 kg)  12/18/17 158 lb 12.8 oz (72 kg)  08/06/17 165 lb (74.8 kg)   Thin, pleasant male, accompanied by his wife.  He got somewhat defensive and agitated when confronting him with his medication, and that he wasn't handling them as perfectly as he insists.  We discussed that the pharmacy calls/auto-refills are not flawless, and that it appears that his amlodipine was left off (partly because he was due to see Dr. Irish Lack at the time his last refill was needed, in Feb, and he didn't). He is clean shaven today, and there is no body odor today. However his pants appeared dirty/stained. HEENT: sclera anicteric, conjunctiva clear, EOMI. OP clear Neck: no lymphadenpathy, thyromegaly or carotid bruit Heart: regular rate  and rhythm Lungs: clear bilaterally Back: no spinal or CVA tenderness Abdomen: soft, nontender, no organomegaly or mass Extremities: 1+ pitting edema pretibial and at ankles, normal pulses Skin: normal turgor.  Bruising on forearms and purpura (he states from the dog). Psych: intermittently slightly agitated, pleasant overall. Today has normal hygiene (though did not when he was here for lab visit). Clothing is stained.   Neuro: alert. Cranial nerves intact. Normal movements, gait. MMSE 24/30 (reported date was April, rather than May; had trouble spelling WORLD backwards--"I can't quite get the end", multiple attempts, sometimes had extra O's.  Last attempt was DLORW; 0 recall (got 2 with hints)0  Abnormal clock drawing--only 9 numbers, no hands. "I ran out of room"   ASSESSMENT/PLAN:  Essential hypertension, benign - suboptimally controlled due to noncompliance with meds, inadvertantly stopped taking amlodipine. Restart and record BP's, bring list to visits - Plan: amLODipine (NORVASC) 5 MG tablet  Cognitive impairment - no improvement on meds, slightly worse. Discussed reasons to f/u with neuro, poss further med adjustments to help more  Pure hypercholesterolemia - continue statin  Coronary artery disease involving native coronary artery of native heart without angina pectoris - past due to f/u with Dr. Irish Lack. asymptomatic/stable.  Senile purpura (HCC)  Edema, unspecified type - discussed low sodium diet. If persists/worsens, may need echo. not currently on diuretic any longer  40-45 min visit, more than 1/2 spent counseling re: meds, reasons to see other doctors, low sodium diet.  I continue to be concerned regarding ability for them to care for themselves, being unwilling to allow others to evaluate their situation to ensure their safety.  I will keep trying to encourage him to allow others to assess and help. Specifically spoke about neuro and testing f/u--that he may feel he  is fine, but to do on my request, that I am asking for assistance (just as I would for someone with other conditions, like him seeing a cardiologist for his heart, or sending someone to a lung doctor for asthma or COPD)--that I am the one asking for help/guidance, regardless of whether he sees a need, to ensure that he is getting the most appropriate care, so that he continue to live safely at home. His wife seemed to be nodding during the discussion, but I don't believe she stands up to him if she disagrees with something he  does.  I will be forwarding notes to Dr. Krista Blue and Dr. Irish Lack so they are also aware of my concerns.  Patient info: Inadvertantly, it appears that you are no longer taking the amlodipine (prescribed by Dr. Irish Lack). I'm refilling it for 30 days today (just as he refilled your other medications for 30 days). You are past due in seeing Dr. Irish Lack, and need to set up an appointment with him.  Please start recording your blood pressures on a sheet of paper. Bring this paper with you to your visits with Dr. Irish Lack and Dr. Tomi Bamberger    I really want you to follow up with the neurologist, Dr. Krista Blue, for your memory problems.  The scoring of your test today was the same or slightly lower than prior to starting the medication last year.  I need his input to decide if there are better treatments. He previously recommend additional testing (neurospych), which you canceled.  As we discussed at your visit, I am in complete agreement with recommending this testing.   Given that a medication was left off (and that is why your blood pressure is high), I would really like for you to reconsider letting [place] or other nurses help with your medications.  When they call, please let them help you.  It also appears that you aren't taking the coreg like you are supposed to (you brought in a bottle from 11/2017 that isn't empty). Please be sure you are taking it TWICE daily as  instructed.   You had some swelling in your legs.  Be sure to follow a low sodium diet.

## 2018-04-30 NOTE — Telephone Encounter (Signed)
Spoke with patient.

## 2018-05-01 ENCOUNTER — Ambulatory Visit: Payer: Medicare Other | Admitting: Family Medicine

## 2018-05-01 ENCOUNTER — Encounter: Payer: Self-pay | Admitting: Family Medicine

## 2018-05-01 VITALS — BP 150/100 | HR 80 | Ht 71.0 in | Wt 156.8 lb

## 2018-05-01 DIAGNOSIS — R4189 Other symptoms and signs involving cognitive functions and awareness: Secondary | ICD-10-CM | POA: Diagnosis not present

## 2018-05-01 DIAGNOSIS — I2510 Atherosclerotic heart disease of native coronary artery without angina pectoris: Secondary | ICD-10-CM

## 2018-05-01 DIAGNOSIS — D692 Other nonthrombocytopenic purpura: Secondary | ICD-10-CM | POA: Diagnosis not present

## 2018-05-01 DIAGNOSIS — I10 Essential (primary) hypertension: Secondary | ICD-10-CM

## 2018-05-01 DIAGNOSIS — E7800 Pure hypercholesterolemia, unspecified: Secondary | ICD-10-CM | POA: Diagnosis not present

## 2018-05-01 DIAGNOSIS — R609 Edema, unspecified: Secondary | ICD-10-CM

## 2018-05-01 MED ORDER — AMLODIPINE BESYLATE 5 MG PO TABS
5.0000 mg | ORAL_TABLET | Freq: Every day | ORAL | 0 refills | Status: AC
Start: 2018-05-01 — End: ?

## 2018-05-01 NOTE — Patient Instructions (Addendum)
  Inadvertantly, it appears that you are no longer taking the amlodipine (prescribed by Dr. Irish Lack). I'm refilling it for 30 days today (just as he refilled your other medications for 30 days). You are past due in seeing Dr. Irish Lack, and need to set up an appointment with him.  It also appears that you aren't taking the coreg like you are supposed to (you brought in a bottle from 11/2017 that isn't empty). Please be sure you are taking it TWICE daily as instructed.  Please start recording your blood pressures on a sheet of paper. Bring this paper with you to your visits with Dr. Irish Lack and Dr. Tomi Bamberger   I really want you to follow up with the neurologist, Dr. Krista Blue, for your memory problems.  The scoring of your test today was the same or slightly lower than prior to starting the medication last year.  I need his input to decide if there are better treatments. He previously recommend additional testing (neurospych), which you canceled.  As we discussed at your visit, I am in complete agreement with recommending this testing.    Given that a medication was left off (and that is why your blood pressure is high), I would really like for you to reconsider letting [place] or other nurses help with your medications.  When they call, please let them help you.   You had some swelling in your legs.  Be sure to follow a low sodium diet.

## 2018-05-02 ENCOUNTER — Encounter: Payer: Self-pay | Admitting: Family Medicine

## 2018-05-07 ENCOUNTER — Telehealth: Payer: Self-pay | Admitting: Neurology

## 2018-05-07 NOTE — Telephone Encounter (Addendum)
I left a message at the patient's contact numbers requesting a call back to schedule a follow up with Dr. Krista Blue.  I also left a message on his son's voicemail (on DPR).  I was able to reach his daughter (also on [place]).  She says his family is still concerned about his memory loss but he is unwilling to speak to them about anything related to his medical conditions.  She does not wish to be "cut out of his life" by bringing up the fact that he needs to follow up for his benefit.  States she does care but is unable to make him come in for an appointment.  Says he "becomes belligerent" when his memory loss is brought up in conversation.

## 2018-05-07 NOTE — Telephone Encounter (Signed)
Multiple attempts in the past to reach out to patient and his family, no showed his appointment, had neuropsychiatric referral, but I did not see the report,  Please contact both patient and his son Elmyra Ricks for appointment,

## 2018-05-18 ENCOUNTER — Other Ambulatory Visit: Payer: Self-pay | Admitting: Family Medicine

## 2018-05-18 ENCOUNTER — Other Ambulatory Visit: Payer: Self-pay | Admitting: Interventional Cardiology

## 2018-05-18 DIAGNOSIS — I10 Essential (primary) hypertension: Secondary | ICD-10-CM

## 2018-05-18 DIAGNOSIS — M1A9XX0 Chronic gout, unspecified, without tophus (tophi): Secondary | ICD-10-CM

## 2018-05-19 NOTE — Telephone Encounter (Signed)
Per your notes, pt is suppose to be getting amlodipine refilled by Dr. Abbie Sons? Pt doesn't have a follow-up appt with Krista Blue either. Does pt need to contact Dr. Abbie Sons and Dr. Krista Blue for refills on this meds

## 2018-05-19 NOTE — Telephone Encounter (Signed)
Left message for pt to call me back. I have left messages on both numbers listed for pt

## 2018-05-19 NOTE — Telephone Encounter (Signed)
I have been filling his donezepil--okay to refill #90 with 1 refill. I refilled his amlodipine for 30d only on 5/23 because he is past due to see Dr. Irish Lack, who usually prescribes this for him. I don't want to keep refilling, or he won't schedule it. He needs to schedule f/u with Dr. Irish Lack, and ask them to refill med until his appt (if they won't refill, which I doubt will be the case since they recently refilled his other meds x 30d), then I'll refill for another 30d, but he MUST call them to schedule his visit).

## 2018-05-20 NOTE — Telephone Encounter (Signed)
Pt was notified and told to call Dr. Irish Lack to schedule and appt and have them refill this med for him. He was given the phone number to call them to schedule

## 2018-06-04 ENCOUNTER — Telehealth: Payer: Self-pay | Admitting: *Deleted

## 2018-06-04 ENCOUNTER — Encounter: Payer: Self-pay | Admitting: *Deleted

## 2018-06-04 NOTE — Telephone Encounter (Signed)
-----   Message from Rita Ohara, MD sent at 06/03/2018  9:24 PM EDT ----- Regarding: needs letter sent Please send a letter to patient (and call), reminding him of the importance of scheduling a follow-up visit with his cardiologist.  It appears that his medications have been denied, as he has been asked multiple times to schedule, visit needed before they will refill meds.  He will be running out of some (amlodipine, and lipitor was also recently denied).  If he doesn't continue on these medications, this will adversely affect his health, putting him at risk for recurrent heart problems, or other cardiovascular disease including stroke.  Thanks

## 2018-06-04 NOTE — Telephone Encounter (Signed)
Detailed message left and I will send letter as well.

## 2018-06-09 ENCOUNTER — Other Ambulatory Visit: Payer: Self-pay | Admitting: Interventional Cardiology

## 2018-06-09 ENCOUNTER — Other Ambulatory Visit: Payer: Self-pay | Admitting: Family Medicine

## 2018-06-09 DIAGNOSIS — I10 Essential (primary) hypertension: Secondary | ICD-10-CM

## 2018-06-09 NOTE — Telephone Encounter (Signed)
Pt is requesting this medication again. It does not appear that he has scheduled with his Cardiologist yet. Please advise further.

## 2018-06-09 NOTE — Telephone Encounter (Signed)
His cardiologist has been refusing his refill requests (for atorvastatin) due to him being past due to be seen.  This is a medication that should be coming from Dr. Irish Lack also.  I picked up that he hadn't been taking it, and refilled it x30d to give him time to get back in with him.  Please let him know that Dr. Irish Lack isn't refilling his meds, due to him not being seen, and he MUST schedule an appointment.  He likely will be able to get a short refill to last until when his appointment is, but he needs to MAKE one.  We sent him a letter 6/26 reminding him to call (with his phone number), to schedule.

## 2018-06-10 ENCOUNTER — Other Ambulatory Visit: Payer: Self-pay

## 2018-06-10 NOTE — Patient Outreach (Signed)
[facility]) Care Management  06/10/2018  Bradley Terrell 12/28/1940 [id number]   Medication Adherence call to Mr. Bradley Terrell spoke with patient he did not want to engage he thought we were not legit and was very upset because we had call, patient is showing past due on Atorvastatin 80 mg.Bradley Terrell is showing past due under Terrell.    [place] Management Direct Dial [phone]  Fax [phone] [email or web address]

## 2018-06-12 ENCOUNTER — Other Ambulatory Visit: Payer: Self-pay | Admitting: Interventional Cardiology

## 2018-06-16 ENCOUNTER — Other Ambulatory Visit: Payer: Self-pay | Admitting: Family Medicine

## 2018-06-16 ENCOUNTER — Other Ambulatory Visit: Payer: Self-pay | Admitting: Interventional Cardiology

## 2018-06-16 DIAGNOSIS — I10 Essential (primary) hypertension: Secondary | ICD-10-CM

## 2018-06-16 NOTE — Telephone Encounter (Signed)
I called and left detailed message informing patient that he should call Dr Ernestene Kiel office to please schedule appt ASAP and also gave him their number. I also let him know about the phone call that he received and that it was "legit" and the reason for the call. I did tell him to please call me if he needed me to clear up any concerns re: any of these phone calls or any concerns that he may have.

## 2018-06-16 NOTE — Telephone Encounter (Signed)
We refused it 7/1.  I do not know if [facility] spoke with patient or not. Looks like there is a refill request today to Dr. Hassell Done office a well (and I see that they did just refill his atorvastatin).  He still doesn't have appt scheduled with Dr. Irish Lack.  Please call pt, state that Dr. Ernestene Kiel office will be the one to refill, not [place], and that he needs to schedule appt with their office, as we have been trying to remind him to do.  Also, let him know that the phone call he got last week was "legit"--they are folks that partner with [facility] to ensure we are keeping our patient's healthy.  They could tell that his lipitor had lapsed, and was just trying to check in.  Let him know that I appreciate the help of this type of assistance, another way to try and help with medication compliance in our patients.

## 2018-06-16 NOTE — Telephone Encounter (Signed)
When he saw you 05/01/18 your note says that this medication is normally filled by his cardiologist, Dr. Irish Lack. You said you will be giving him one month's worth. I have called him and sent a letter in regards to him scheduling with them. Do you want me to fill again or deny?

## 2018-07-13 ENCOUNTER — Other Ambulatory Visit: Payer: Self-pay | Admitting: Interventional Cardiology

## 2018-07-13 ENCOUNTER — Other Ambulatory Visit: Payer: Self-pay | Admitting: Family Medicine

## 2018-07-13 DIAGNOSIS — I10 Essential (primary) hypertension: Secondary | ICD-10-CM

## 2018-07-15 ENCOUNTER — Emergency Department (HOSPITAL_COMMUNITY)
Admission: EM | Admit: 2018-07-15 | Discharge: 2018-07-17 | Disposition: A | Discharge status: Other Acute Inpatient Hospital | Payer: Medicare Other | Attending: Emergency Medicine | Admitting: Emergency Medicine

## 2018-07-15 ENCOUNTER — Other Ambulatory Visit: Payer: Self-pay

## 2018-07-15 ENCOUNTER — Encounter (HOSPITAL_COMMUNITY): Payer: Self-pay

## 2018-07-15 DIAGNOSIS — R455 Hostility: Secondary | ICD-10-CM | POA: Insufficient documentation

## 2018-07-15 DIAGNOSIS — F0391 Unspecified dementia with behavioral disturbance: Secondary | ICD-10-CM | POA: Diagnosis not present

## 2018-07-15 DIAGNOSIS — F1099 Alcohol use, unspecified with unspecified alcohol-induced disorder: Secondary | ICD-10-CM | POA: Insufficient documentation

## 2018-07-15 DIAGNOSIS — I10 Essential (primary) hypertension: Secondary | ICD-10-CM | POA: Diagnosis not present

## 2018-07-15 DIAGNOSIS — R45851 Suicidal ideations: Secondary | ICD-10-CM | POA: Diagnosis not present

## 2018-07-15 DIAGNOSIS — R45850 Homicidal ideations: Secondary | ICD-10-CM | POA: Diagnosis not present

## 2018-07-15 DIAGNOSIS — I2510 Atherosclerotic heart disease of native coronary artery without angina pectoris: Secondary | ICD-10-CM | POA: Diagnosis not present

## 2018-07-15 DIAGNOSIS — Z046 Encounter for general psychiatric examination, requested by authority: Secondary | ICD-10-CM | POA: Insufficient documentation

## 2018-07-15 DIAGNOSIS — F321 Major depressive disorder, single episode, moderate: Secondary | ICD-10-CM | POA: Diagnosis not present

## 2018-07-15 DIAGNOSIS — Z79899 Other long term (current) drug therapy: Secondary | ICD-10-CM | POA: Diagnosis not present

## 2018-07-15 DIAGNOSIS — Z9114 Patient's other noncompliance with medication regimen: Secondary | ICD-10-CM | POA: Diagnosis not present

## 2018-07-15 DIAGNOSIS — F03918 Unspecified dementia, unspecified severity, with other behavioral disturbance: Secondary | ICD-10-CM | POA: Diagnosis present

## 2018-07-15 DIAGNOSIS — F918 Other conduct disorders: Secondary | ICD-10-CM | POA: Diagnosis present

## 2018-07-15 DIAGNOSIS — R9431 Abnormal electrocardiogram [ECG] [EKG]: Secondary | ICD-10-CM | POA: Diagnosis not present

## 2018-07-15 DIAGNOSIS — Z951 Presence of aortocoronary bypass graft: Secondary | ICD-10-CM | POA: Insufficient documentation

## 2018-07-15 DIAGNOSIS — Y906 Blood alcohol level of 120-199 mg/100 ml: Secondary | ICD-10-CM | POA: Diagnosis not present

## 2018-07-15 DIAGNOSIS — Z049 Encounter for examination and observation for unspecified reason: Secondary | ICD-10-CM

## 2018-07-15 DIAGNOSIS — F03C0 Unspecified dementia, severe, without behavioral disturbance, psychotic disturbance, mood disturbance, and anxiety: Secondary | ICD-10-CM

## 2018-07-15 DIAGNOSIS — F0390 Unspecified dementia without behavioral disturbance: Secondary | ICD-10-CM

## 2018-07-15 HISTORY — DX: Unspecified dementia, unspecified severity, without behavioral disturbance, psychotic disturbance, mood disturbance, and anxiety: F03.90

## 2018-07-15 HISTORY — DX: Gout, unspecified: M10.9

## 2018-07-15 HISTORY — DX: Alcohol abuse, uncomplicated: F10.10

## 2018-07-15 LAB — COMPREHENSIVE METABOLIC PANEL
ALBUMIN: 4 g/dL (ref 3.5–5.0)
ALT: 104 U/L — AB (ref 0–44)
AST: 176 U/L — AB (ref 15–41)
Alkaline Phosphatase: 73 U/L (ref 38–126)
Anion gap: 11 (ref 5–15)
BILIRUBIN TOTAL: 2.1 mg/dL — AB (ref 0.3–1.2)
BUN: 9 mg/dL (ref 8–23)
CO2: 27 mmol/L (ref 22–32)
CREATININE: 0.98 mg/dL (ref 0.61–1.24)
Calcium: 9 mg/dL (ref 8.9–10.3)
Chloride: 104 mmol/L (ref 98–111)
GFR calc Af Amer: >60 mL/min (ref >60)
GLUCOSE: 115 mg/dL — AB (ref 70–99)
Potassium: 3.2 mmol/L — ABNORMAL LOW (ref 3.5–5.1)
Sodium: 142 mmol/L (ref 135–145)
TOTAL PROTEIN: 7.2 g/dL (ref 6.5–8.1)

## 2018-07-15 LAB — ETHANOL: ALCOHOL ETHYL (B): 197 mg/dL — AB (ref <10)

## 2018-07-15 LAB — CBC
HEMATOCRIT: 47.5 % (ref 39.0–52.0)
Hemoglobin: 16.5 g/dL (ref 13.0–17.0)
MCH: 33.1 pg (ref 26.0–34.0)
MCHC: 34.7 g/dL (ref 30.0–36.0)
MCV: 95.2 fL (ref 78.0–100.0)
Platelets: 134 10*3/uL — ABNORMAL LOW (ref 150–400)
RBC: 4.99 MIL/uL (ref 4.22–5.81)
RDW: 12.8 % (ref 11.5–15.5)
WBC: 4.7 10*3/uL (ref 4.0–10.5)

## 2018-07-15 LAB — ACETAMINOPHEN LEVEL: Acetaminophen (Tylenol), Serum: <10 ug/mL — ABNORMAL LOW (ref 10–30)

## 2018-07-15 LAB — SALICYLATE LEVEL: Salicylate Lvl: <7 mg/dL (ref 2.8–30.0)
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
## 2018-07-16 ENCOUNTER — Emergency Department (HOSPITAL_COMMUNITY): Payer: Medicare Other

## 2018-07-16 ENCOUNTER — Encounter (HOSPITAL_COMMUNITY): Payer: Self-pay | Admitting: *Deleted

## 2018-07-16 LAB — URINALYSIS, ROUTINE W REFLEX MICROSCOPIC
Bilirubin Urine: NEGATIVE
GLUCOSE, UA: NEGATIVE mg/dL
Ketones, ur: 5 mg/dL — AB
Leukocytes, UA: NEGATIVE
Nitrite: NEGATIVE
PROTEIN: NEGATIVE mg/dL
SPECIFIC GRAVITY, URINE: 1.017 (ref 1.005–1.030)
pH: 5 (ref 5.0–8.0)

## 2018-07-16 LAB — RAPID URINE DRUG SCREEN, HOSP PERFORMED
Amphetamines: NOT DETECTED
BARBITURATES: NOT DETECTED
Benzodiazepines: NOT DETECTED
Cocaine: NOT DETECTED
OPIATES: NOT DETECTED
TETRAHYDROCANNABINOL: NOT DETECTED

## 2018-07-16 MED ORDER — LORAZEPAM 2 MG/ML IJ SOLN: 0.0000 mg | Freq: Four times a day (QID) | INTRAMUSCULAR | Status: DC

## 2018-07-16 MED ORDER — ACETAMINOPHEN 325 MG PO TABS: 650.0000 mg | ORAL_TABLET | ORAL | Status: DC | PRN

## 2018-07-16 MED ORDER — ATORVASTATIN CALCIUM 80 MG PO TABS
80.0000 mg | ORAL_TABLET | Freq: Every day | ORAL | Status: DC
  Administered 2018-07-16: 80 mg via ORAL
  Filled 2018-07-16 (×2): qty 1

## 2018-07-16 MED ORDER — LISINOPRIL 20 MG PO TABS
20.0000 mg | ORAL_TABLET | Freq: Two times a day (BID) | ORAL | Status: DC
  Administered 2018-07-16 – 2018-07-17 (×4): 20 mg via ORAL
  Filled 2018-07-16 (×4): qty 1

## 2018-07-16 MED ORDER — LORAZEPAM 2 MG/ML IJ SOLN: 0.0000 mg | Freq: Two times a day (BID) | INTRAMUSCULAR | Status: DC

## 2018-07-16 MED ORDER — LORAZEPAM 1 MG PO TABS
0.0000 mg | ORAL_TABLET | Freq: Four times a day (QID) | ORAL | Status: DC
  Administered 2018-07-16: 1 mg via ORAL
  Administered 2018-07-16: 2 mg via ORAL
  Administered 2018-07-16 (×2): 1 mg via ORAL
  Administered 2018-07-17 (×3): 2 mg via ORAL
  Filled 2018-07-16 (×2): qty 2
  Filled 2018-07-16: qty 1
  Filled 2018-07-16 (×3): qty 2
  Filled 2018-07-16: qty 1

## 2018-07-16 MED ORDER — CARVEDILOL 6.25 MG PO TABS
6.2500 mg | ORAL_TABLET | Freq: Two times a day (BID) | ORAL | Status: DC
  Administered 2018-07-16 – 2018-07-17 (×3): 6.25 mg via ORAL
  Filled 2018-07-16 (×4): qty 1

## 2018-07-16 MED ORDER — VITAMIN B-1 100 MG PO TABS
100.0000 mg | ORAL_TABLET | Freq: Every day | ORAL | Status: DC
  Administered 2018-07-16 – 2018-07-17 (×2): 100 mg via ORAL
  Filled 2018-07-16 (×2): qty 1

## 2018-07-16 MED ORDER — LORAZEPAM 1 MG PO TABS
2.0000 mg | ORAL_TABLET | Freq: Once | ORAL | Status: AC
  Administered 2018-07-16: 2 mg via ORAL
  Filled 2018-07-16: qty 2

## 2018-07-16 MED ORDER — LORAZEPAM 1 MG PO TABS: 0.0000 mg | ORAL_TABLET | Freq: Two times a day (BID) | ORAL | Status: DC

## 2018-07-16 MED ORDER — AMLODIPINE BESYLATE 5 MG PO TABS
5.0000 mg | ORAL_TABLET | Freq: Every day | ORAL | Status: DC
  Administered 2018-07-16 – 2018-07-17 (×2): 5 mg via ORAL
  Filled 2018-07-16 (×2): qty 1

## 2018-07-16 MED ORDER — THIAMINE HCL 100 MG/ML IJ SOLN: 100.0000 mg | Freq: Every day | INTRAMUSCULAR | Status: DC

## 2018-07-16 MED ORDER — HALOPERIDOL LACTATE 5 MG/ML IJ SOLN: 5.0000 mg | INTRAMUSCULAR | Status: DC | PRN

## 2018-07-16 NOTE — ED Notes (Signed)
TTS assessment in progress. 

## 2018-07-16 NOTE — ED Provider Notes (Signed)
2:02 PM- Asked to see patient for increasing  Need for redirection/confusion.  At this time, the patient is alert, confused, answers questions tangentially, and tends to ramble.  He appears fairly comfortable.  He is redirectable.  He has mild dysarthria without apparent expressive or receptive aphasia.  Patient has not had any injuries.  The patient appears to require placement for safety and ongoing management of unspecified delirium.      Daleen Bo, MD 07/18/18 1014
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
## 2018-07-16 NOTE — ED Notes (Signed)
PER DAUGHTER- MOTHER IS IMPROVING. POSSIBLE SKILLED NURSING CARE . PLEASE DO NOT TELL HIM. GOOD SPIRITS. BROTHER BRUCE IS WITH HER. ALL IS WELL.   PER SON, HEAVY DRINKER -BEER, SWEET TEA VODKA AND BOURBON DAILY. DRINKS UNTIL HE PASSES OUT.

## 2018-07-16 NOTE — ED Notes (Signed)
[place] CE[facility] [place] [facility] [phone]
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
## 2018-07-16 NOTE — ED Notes (Signed)
Pt stated "I drink a small shot of wine."

## 2018-07-16 NOTE — ED Notes (Signed)
Bradley Terrell MADE AWARE OF FAMILY'S AVAILABILITY TO ANSWER QUESTIONS. WAITING RESPONSE

## 2018-07-16 NOTE — ED Notes (Signed)
Patient transported to X-ray 

## 2018-07-16 NOTE — ED Notes (Signed)
TTS PRESENT AT BEDSIDE

## 2018-07-16 NOTE — BH Assessment (Signed)
[facility] Assessment Progress Note  Per Buford Dresser, DO, this pt requires psychiatric hospitalization at this time.  Pt presents under IVC initiated by pt's son, which Dr Mariea Clonts has upheld.  The following facilities have been contacted to seek placement for this pt, with results as noted:  Beds available, information sent, decision pending:  [facility]   At capacity:  Kerry Dory Cordova, [place] Coordinator [phone]

## 2018-07-16 NOTE — ED Notes (Signed)
ED Provider at bedside. [facility] [facility]

## 2018-07-16 NOTE — ED Notes (Signed)
Offered pt. A shower, he refused. Will offer again later.

## 2018-07-16 NOTE — ED Notes (Addendum)
PT ORIENTED TO SELF AND DISORIENTED TO PLACE AND TIME. UNABLE TO STAY IN BED. PT CONTINUES TO NEED REMINDERS TO REMAIN IN BED AND KEEP CLOTHES ON. PT TALKING TO PERSONS IN ROOM THAT ARE NOT THERE. SITTER AT BEDSIDE FOR SAFETY AND VERBALIZE REMINDERS.

## 2018-07-16 NOTE — ED Notes (Signed)
Pt. Is undressing himself in bed and pulling all the linen off the bed.

## 2018-07-17 DIAGNOSIS — F03918 Unspecified dementia, unspecified severity, with other behavioral disturbance: Secondary | ICD-10-CM | POA: Diagnosis present

## 2018-07-17 DIAGNOSIS — F0391 Unspecified dementia with behavioral disturbance: Secondary | ICD-10-CM

## 2018-07-17 MED ORDER — QUETIAPINE FUMARATE 50 MG PO TABS: 50.0000 mg | ORAL_TABLET | Freq: Every day | ORAL | Status: DC

## 2018-07-17 NOTE — BH Assessment (Signed)
[facility] Assessment Progress Note  Per Buford Dresser, DO, this pt continues to require psychiatric hospitalization at this time.  Pt presents under IVC initiated by pt's son, which Dr Mariea Clonts has upheld.  At 13:46 Carlyon Shadow calls from [facility] to report that pt has been accepted to their facility by Dr Sanjuana Letters.  Ethelene Hal, NP concurs with this decision.  Pt's nurse, Ginger, has been notified, and agrees to call report to [phone] if pt is transported before 19:00, or [phone] if pt is transported after 19:00.  Pt is to be transported via [facility].  [place] Coordinator [phone]

## 2018-07-17 NOTE — ED Notes (Signed)
Pt discharged safely with [facility].  All belongings were sent with patient.  Pt was in no distress and was pushed out in wheelchair by [employer].

## 2018-07-17 NOTE — Progress Notes (Signed)
Gave report to [facility] [place] transport via [employer].

## 2018-07-17 NOTE — ED Notes (Signed)
Sheriffs department at bedside to transport pt to [facility]. Pts IVC paper work given to [employer]. Pts belongings given to deputy.

## 2018-07-17 NOTE — Consult Note (Addendum)
[facility] Consult   Reason for Consult:  SI, HI and psychosis  Referring Physician:  EDP Patient Identification: KAILAND SEDA MRN:  [id number] Principal Diagnosis: Dementia with behavioral disturbance Diagnosis:   Patient Active Problem List   Diagnosis Date Noted  . Memory loss [R41.3] 08/06/2017  . 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 Abnormal nuclear stress test [R94.39] 03/18/2015  . Gout [M10.9] 07/30/2011  . CAD (coronary artery disease) [I25.10] 07/30/2011  . Pure hypercholesterolemia [E78.00] 07/30/2011  . Essential hypertension, benign [I10] 07/30/2011    Total Time spent with patient: 30 minutes  Subjective:   Bradley Terrell is a 77 y.o. male patient admitted with SI, HI and psychosis in the setting of alcohol use.  HPI:   Mr. Pulse presented to the hospital with SI, HI and psychosis in the setting of alcohol use. BAL was 197 on admission. He has a history of heavy alcohol use. Daughter was at bedside. She denies a history of seizures or DTs. He denies SI or HI today. He denies AVH but daughter reports that he continues to endorse paranoia and delusional thoughts. She reports that he needs assistance with his ADLs. She would like to move him to [place] with her and her brother once he is medically and psychiatrically cleared. He ate breakfast this morning and slept overnight. He continues to be confused. He is only oriented to self. He denies withdrawal symptoms today and does not appear tremulous.    Past Psychiatric History: Dementia and alcohol abuse.   Risk to Self: None. Denies SI.  Risk to Others: None. Denies HI.  Prior Inpatient Therapy: Prior Inpatient Therapy: No Prior Outpatient Therapy: Prior Outpatient Therapy: No Does patient have an ACCT team?: No Does patient have Intensive In-House Services?  : No Does patient have [facility] services? : No Does patient have P4CC services?: No  Past Medical History:  Past  Medical History:  Diagnosis Date  . CAD (coronary artery disease)    a. PCI in 1991 (in [place]); b. LHC 4/16: dLM 95%, severe ostial LAD and ostial CFX disease extending from the LM, severe oRI, pRCA 99%, EF 60% >> CABG  . Dementia   . ETOH abuse   . Gilbert syndrome    elevated indirect bilirubin  . Gout   . HLD (hyperlipidemia)   . HTN (hypertension)   . Inguinal hernia    R>L, easily reducible  . Memory loss   . PAF (paroxysmal atrial fibrillation) ([place])    post CABG >> converted to NSR with Amiodarone  . Renal cyst    left    Past Surgical History:  Procedure Laterality Date  . cardiac atherectomy  '91   to LAD and RCA  . COLONOSCOPY  03/2009, 05/2014  . CORONARY ARTERY BYPASS GRAFT N/A 03/18/2015   Procedure: CORONARY ARTERY BYPASS GRAFTING (CABG) x  four, LIMA to LAD, SVG-Ramus-dCx, SVG - dRCA using left internal mammary artery and right leg thigh & calf greater saphenous vein harvested endoscopically;  Surgeon: Grace Isaac, MD;  Location: [place];  Service: Open Heart Surgery;  Laterality: N/A;  . ENDOVEIN HARVEST OF GREATER SAPHENOUS VEIN Right 03/18/2015   Procedure: ENDOSCPOIC HARVEST OF RIGHT GREATER SAPHENOUS VEIN;  Surgeon: Grace Isaac, MD;  Location: [place];  Service: Open Heart Surgery;  Laterality: Right;  . INTRAOPERATIVE TRANSESOPHAGEAL ECHOCARDIOGRAM N/A 03/18/2015   Procedure: INTRAOPERATIVE TRANSESOPHAGEAL ECHOCARDIOGRAM;  Surgeon: Grace Isaac, MD;  Location: [place];  Service:  Open Heart Surgery;  Laterality: N/A;  . LEFT HEART CATHETERIZATION WITH CORONARY ANGIOGRAM N/A 03/18/2015   Procedure: LEFT HEART CATHETERIZATION WITH CORONARY ANGIOGRAM;  Surgeon: Jettie Booze, MD;  Location: [facility] CATH LAB;  Service: Cardiovascular;  Laterality: N/A;  . PRK  '96   (refractive eye surgery)  . TONSILLECTOMY  age 85   Family History:  Family History  Problem Relation Age of Onset  . Cancer Mother        breast  . Hypertension Father   . Heart disease Father    . Heart attack Father   . Cancer Sister        pancreatic cancer  . Heart disease Brother 67       CABG  . Cancer Brother        colon  . Colon cancer Brother        late 22's  . Cancer Son 61       colon cancer, recurred at 11 (metastatic)  . Colon cancer Son   . Cancer Brother 29       lung cancer, smoker  . Parkinson's disease Sister   . Hypertension Sister   . Cancer Other        pancreatic  . Heart attack Paternal Grandfather   . Diabetes Neg Hx    Family Psychiatric  History: Unknown  Social History:  Social History   Substance and Sexual Activity  Alcohol Use Yes  . Alcohol/week: 0.0 standard drinks   Comment: 1 Miller Lite/day). No longer has bourbon and ginger ale     Social History   Substance and Sexual Activity  Drug Use No    Social History   Socioeconomic History  . Marital status: Married    Spouse name: Not on file  . Number of children: 3  . Years of education: 19 years  . Highest education level: Not on file  Occupational History  . Occupation: Diplomatic Services operational officer: [employer]  . Financial resource strain: Not on file  . Food insecurity:    Worry: Not on file    Inability: Not on file  . Transportation needs:    Medical: Not on file    Non-medical: Not on file  Tobacco Use  . Smoking status: Never Smoker  . Smokeless tobacco: Never Used  Substance and Sexual Activity  . Alcohol use: Yes    Alcohol/week: 0.0 standard drinks    Comment: 1 Miller Lite/day). No longer has bourbon and ginger ale  . Drug use: No  . Sexual activity: Not on file  Lifestyle  . Physical activity:    Days per week: Not on file    Minutes per session: Not on file  . Stress: Not on file  Relationships  . Social connections:    Talks on phone: Not on file    Gets together: Not on file    Attends religious service: Not on file    Active member of club or organization: Not on file    Attends meetings of  clubs or organizations: Not on file    Relationship status: Not on file  Other Topics Concern  . Not on file  Social History Narrative   Married, 1 dog, 1 cat.  Son in [place] (has terminal colon cancer)--passed away 12/14/15, 3 in [place], Merrilee Seashore, and son from a previous marriage). 7 grandchildren, 2 great-grandchildren   Right-handed.   1 cup coffee per day.  Lives at home with his wife.   Additional Social History: He was previously living at home with his wife. He has a chronic history of heavy alcohol use.     Allergies:  No Known Allergies  Labs:  Results for orders placed or performed during the hospital encounter of 07/15/18 (from the past 48 hour(s))  Comprehensive metabolic panel     Status: Abnormal   Collection Time: 07/15/18  8:13 PM  Result Value Ref Range   Sodium 142 135 - 145 mmol/L   Potassium 3.2 (L) 3.5 - 5.1 mmol/L   Chloride 104 98 - 111 mmol/L   CO2 27 22 - 32 mmol/L   Glucose, Bld 115 (H) 70 - 99 mg/dL   BUN 9 8 - 23 mg/dL   Creatinine, Ser 0.98 0.61 - 1.24 mg/dL   Calcium 9.0 8.9 - 10.3 mg/dL   Total Protein 7.2 6.5 - 8.1 g/dL   Albumin 4.0 3.5 - 5.0 g/dL   AST 176 (H) 15 - 41 U/L   ALT 104 (H) 0 - 44 U/L   Alkaline Phosphatase 73 38 - 126 U/L   Total Bilirubin 2.1 (H) 0.3 - 1.2 mg/dL   GFR calc non Af Amer >60 >60 mL/min   GFR calc Af Amer >60 >60 mL/min    Comment: (NOTE) The eGFR has been calculated using the CKD EPI equation. This calculation has not been validated in all clinical situations. eGFR's persistently <60 mL/min signify possible Chronic Kidney Disease.    Anion gap 11 5 - 15    Comment: Performed at [facility], [place] [address] [place], [place] [address]  Ethanol     Status: Abnormal   Collection Time: 07/15/18  8:13 PM  Result Value Ref Range   Alcohol, Ethyl (B) 197 (H) <10 mg/dL    Comment: (NOTE) Lowest detectable limit for serum alcohol is 10 mg/dL. For medical purposes only. Performed at [facility], [place] [address], [place], [place] [address]   Salicylate level     Status: None   Collection Time: 07/15/18  8:13 PM  Result Value Ref Range   Salicylate Lvl <3.9 2.8 - 30.0 mg/dL    Comment: Performed at [facility], [place] [address] [place], [place] [address]  Acetaminophen level     Status: Abnormal   Collection Time: 07/15/18  8:13 PM  Result Value Ref Range   Acetaminophen (Tylenol), Serum <10 (L) 10 - 30 ug/mL    Comment: (NOTE) Therapeutic concentrations vary significantly. A range of 10-30 ug/mL  may be an effective concentration for many patients. However, some  are best treated at concentrations outside of this range. Acetaminophen concentrations >150 ug/mL at 4 hours after ingestion  and >50 ug/mL at 12 hours after ingestion are often associated with  toxic reactions. Performed at [facility], [place] [address], [place], [place] [address]   cbc     Status: Abnormal   Collection Time: 07/15/18  8:13 PM  Result Value Ref Range   WBC 4.7 4.0 - 10.5 K/uL   RBC 4.99 4.22 - 5.81 MIL/uL   Hemoglobin 16.5 13.0 - 17.0 g/dL   HCT 47.5 39.0 - 52.0 %   MCV 95.2 78.0 - 100.0 fL   MCH 33.1 26.0 - 34.0 pg   MCHC 34.7 30.0 - 36.0 g/dL   RDW 12.8 11.5 - 15.5 %   Platelets 134 (L) 150 - 400 K/uL    Comment: Performed at  [facility], [place] [address], [place], [place] [address]  Rapid urine drug screen (hospital performed)     Status: None   Collection Time: 07/15/18  8:13 PM  Result Value Ref Range   Opiates NONE DETECTED NONE DETECTED   Cocaine NONE DETECTED NONE DETECTED   Benzodiazepines NONE DETECTED NONE DETECTED   Amphetamines NONE DETECTED NONE DETECTED   Tetrahydrocannabinol NONE DETECTED NONE DETECTED   Barbiturates NONE DETECTED NONE DETECTED    Comment: (NOTE) DRUG SCREEN FOR MEDICAL PURPOSES ONLY.  IF CONFIRMATION IS NEEDED FOR ANY PURPOSE, NOTIFY LAB WITHIN 5 DAYS. LOWEST DETECTABLE  LIMITS FOR URINE DRUG SCREEN Drug Class                     Cutoff (ng/mL) Amphetamine and metabolites    1000 Barbiturate and metabolites    200 Benzodiazepine                 732 Tricyclics and metabolites     300 Opiates and metabolites        300 Cocaine and metabolites        300 THC                            50 Performed at [facility], [place] [address] [place], [place] [address]   Urinalysis, Routine w reflex microscopic     Status: Abnormal   Collection Time: 07/16/18  1:05 PM  Result Value Ref Range   Color, Urine AMBER (A) YELLOW    Comment: BIOCHEMICALS MAY BE AFFECTED BY COLOR   APPearance CLEAR CLEAR   Specific Gravity, Urine 1.017 1.005 - 1.030   pH 5.0 5.0 - 8.0   Glucose, UA NEGATIVE NEGATIVE mg/dL   Hgb urine dipstick SMALL (A) NEGATIVE   Bilirubin Urine NEGATIVE NEGATIVE   Ketones, ur 5 (A) NEGATIVE mg/dL   Protein, ur NEGATIVE NEGATIVE mg/dL   Nitrite NEGATIVE NEGATIVE   Leukocytes, UA NEGATIVE NEGATIVE   WBC, UA 0-5 0 - 5 WBC/hpf   Bacteria, UA RARE (A) NONE SEEN   Squamous Epithelial / LPF 0-5 0 - 5   Mucus PRESENT     Comment: Performed at [facility], [place] [address], [place], [place] [address]    Current Facility-Administered Medications  Medication Dose Route Frequency Provider Last Rate Last Dose  . acetaminophen (TYLENOL) tablet 650 mg  650 mg Oral Q4H PRN Nanavati, Ankit, MD      . amLODipine (NORVASC) tablet 5 mg  5 mg Oral Daily Kathrynn Humble, Ankit, MD   5 mg at 07/17/18 0923  . atorvastatin (LIPITOR) tablet 80 mg  80 mg Oral q1800 Varney Biles, MD   80 mg at 07/16/18 1949  . carvedilol (COREG) tablet 6.25 mg  6.25 mg Oral BID WC Nanavati, Ankit, MD   6.25 mg at 07/17/18 3762  . haloperidol lactate (HALDOL) injection 5 mg  5 mg Intravenous Q2H PRN Daleen Bo, MD      . lisinopril (PRINIVIL,ZESTRIL) tablet 20 mg  20 mg Oral BID Varney Biles, MD   20 mg at 07/17/18 0923  . LORazepam (ATIVAN) injection  0-4 mg  0-4 mg Intravenous Q6H Varney Biles, MD   Stopped at 07/16/18 1209   Or  . LORazepam (ATIVAN) tablet 0-4 mg  0-4 mg Oral Q6H Nanavati, Ankit, MD   2 mg at 07/17/18 0600  . [START ON 07/18/2018] LORazepam (ATIVAN) injection  0-4 mg  0-4 mg Intravenous Q12H Varney Biles, MD       Or  . Derrill Memo ON 07/18/2018] LORazepam (ATIVAN) tablet 0-4 mg  0-4 mg Oral Q12H Nanavati, Ankit, MD      . thiamine (VITAMIN B-1) tablet 100 mg  100 mg Oral Daily Kathrynn Humble, Ankit, MD   100 mg at 07/17/18 1245   Or  . thiamine (B-1) injection 100 mg  100 mg Intravenous Daily Varney Biles, MD       Current Outpatient Medications  Medication Sig Dispense Refill  . amLODipine (NORVASC) 5 MG tablet Take 1 tablet (5 mg total) by mouth daily. 30 tablet 0  . Apoaequorin (PREVAGEN PO) Take 1 tablet by mouth daily.    Marland Kitchen aspirin 81 MG EC tablet Take 1 tablet (81 mg total) by mouth daily. (Patient not taking: Reported on 05/01/2018) 90 tablet 3  . atorvastatin (LIPITOR) 80 MG tablet Take 1 tablet (80 mg total) by mouth daily at 6 PM. Please make overdue appt with Dr. Irish Lack. 3rd and Final Attempt 15 tablet 0  . Biotin 5000 MCG CAPS Take 1 capsule by mouth daily. Reported on 06/25/2016    . Calcium Carbonate-Vit D-Min (CALCIUM 600+D PLUS MINERALS) 600-400 MG-UNIT TABS Take 1 tablet by mouth daily.      . carvedilol (COREG) 6.25 MG tablet TAKE ONE TABLET BY MOUTH TWICE A DAY WITH A MEAL 60 tablet 5  . donepezil (ARICEPT) 10 MG tablet TAKE ONE TABLET BY MOUTH AT BEDTIME 90 tablet 0  . donepezil (ARICEPT) 10 MG tablet TAKE ONE TABLET BY MOUTH AT BEDTIME 90 tablet 1  . Glucosamine 500 MG CAPS Take 1 capsule by mouth daily. Reported on 06/25/2016    . lisinopril (PRINIVIL,ZESTRIL) 20 MG tablet Take 1 tablet (20 mg total) by mouth 2 (two) times daily. Please make overdue appt with Dr. Irish Lack. 3rd and Final Attempt 30 tablet 0  . Misc Natural Products (LUTEIN 20 PO) Take 1 capsule by mouth daily.    . Multiple  Vitamins-Minerals (CENTRUM SILVER PO) Take 1 tablet by mouth daily.      . nitroGLYCERIN (NITROSTAT) 0.4 MG SL tablet DISSOLVE 1 TABLET UNDER THE TONGUE EVERY 5 MINUTES AS  NEEDED FOR CHEST PAIN (Patient not taking: Reported on 12/18/2017) 75 tablet 1  . NON FORMULARY raisIns soaked in gin    . NON FORMULARY Take 1 capsule by mouth daily.    . Omega-3 Fatty Acids (FISH OIL) 1200 MG CAPS Take 1 capsule by mouth daily.    . saw palmetto 160 MG capsule Take 160 mg by mouth daily.     Marland Kitchen ULORIC 40 MG tablet TAKE ONE TABLET BY MOUTH DAILY 15 tablet 10  . Zinc 30 MG TABS Take 1 tablet by mouth daily.      Musculoskeletal: Strength & Muscle Tone: decreased due to physical deconditioning.  Gait & Station: UTA since patient is lying in bed. Patient leans: N/A  Psychiatric Specialty Exam: Physical Exam  Nursing note and vitals reviewed. Constitutional: He appears well-developed and well-nourished.  HENT:  Head: Normocephalic and atraumatic.  Neck: Normal range of motion.  Respiratory: Effort normal.  Musculoskeletal: Normal range of motion.  Neurological: He is alert.  Oriented to self.  Skin: No rash noted.  Psychiatric: His speech is normal and behavior is normal. Judgment normal. His mood appears anxious. Thought content is paranoid and delusional. Cognition and memory are impaired.    Review of Systems  Neurological: Negative for tremors.  Psychiatric/Behavioral: Positive for hallucinations and substance abuse. Negative for suicidal ideas. The patient does not have insomnia.   All other systems reviewed and are negative.   Blood pressure (!) 153/88, pulse (!) 57, temperature 97.9 F (36.6 C), temperature source Oral, resp. rate 16, height 6' (1.829 m), weight 70.8 kg, SpO2 98 %.Body mass index is 21.16 kg/m.  General Appearance: Fairly Groomed, elderly, Caucasian male, wearing paper hospital scrubs and lying in bed. NAD.   Eye Contact:  Good  Speech:  Clear and Coherent and Normal Rate   Volume:  Normal  Mood:  Euthymic  Affect:  Constricted  Thought Process:  Linear and Descriptions of Associations: Intact  Orientation:  Other:  Oriented to self.  Thought Content:  Delusions and Paranoid Ideation  Suicidal Thoughts:  No  Homicidal Thoughts:  No  Memory:  Immediate;   Poor Recent;   Poor Remote;   Fair  Judgement:  Impaired  Insight:  Lacking  Psychomotor Activity:  Decreased  Concentration:  Concentration: Fair and Attention Span: Fair  Recall:  [employer] of Knowledge:  Fair  Language:  Fair  Akathisia:  No  Handed:  Right  AIMS (if indicated):   N/A  Assets:  Financial Resources/Insurance Housing Social Support  ADL's:  Impaired  Cognition:  Impaired due to dementia.  Sleep:   Okay   Assessment:  Bradley Terrell is a 77 y.o. male who was admitted with SI, HI and psychosis in the setting of alcohol intoxication. He is receiving treatment for alcohol withdrawal which appears improved today. He continues to remained confused with paranoid and delusional thoughts. Will start Seroquel for psychosis. He continues to warrant inpatient psychiatric hospitalization.   Treatment Plan Summary: Daily contact with patient to assess and evaluate symptoms and progress in treatment and Medication management  -Continue Ativan protocol for alcohol withdrawal. -Start Seroquel 50 mg qhs for psychosis.  -EKG reviewed and QTc 470 on 8/7. Will closely monitor when starting or increasing QTc prolonging agents.    Disposition: Recommend psychiatric Inpatient admission when medically cleared.  Faythe Dingwall, DO 07/17/2018 11:38 AM

## 2018-07-18 DIAGNOSIS — Z789 Other specified health status: Secondary | ICD-10-CM | POA: Diagnosis not present

## 2018-07-18 DIAGNOSIS — I10 Essential (primary) hypertension: Secondary | ICD-10-CM | POA: Diagnosis not present

## 2018-07-18 DIAGNOSIS — G9389 Other specified disorders of brain: Secondary | ICD-10-CM | POA: Diagnosis not present

## 2018-07-19 DIAGNOSIS — Z789 Other specified health status: Secondary | ICD-10-CM | POA: Diagnosis not present

## 2018-07-19 DIAGNOSIS — I2510 Atherosclerotic heart disease of native coronary artery without angina pectoris: Secondary | ICD-10-CM | POA: Diagnosis not present

## 2018-07-19 DIAGNOSIS — I10 Essential (primary) hypertension: Secondary | ICD-10-CM | POA: Diagnosis not present

## 2018-07-21 ENCOUNTER — Telehealth: Payer: Self-pay | Admitting: Family Medicine

## 2018-07-21 DIAGNOSIS — Z789 Other specified health status: Secondary | ICD-10-CM | POA: Diagnosis not present

## 2018-07-21 DIAGNOSIS — I10 Essential (primary) hypertension: Secondary | ICD-10-CM | POA: Diagnosis not present

## 2018-07-21 DIAGNOSIS — I2510 Atherosclerotic heart disease of native coronary artery without angina pectoris: Secondary | ICD-10-CM | POA: Diagnosis not present

## 2018-07-21 NOTE — Telephone Encounter (Signed)
Pt's son called and he is listed on [employer]. He states that his father has been moved to a [employer] and he requested a current copy of his medications. He states he is concerned because while pt was at [facility] they were unsure of what medications he was on and father was unable to assist. Son  is confused about what he should be on when he takes them. He is requesting that Dr. Tomi Bamberger review meds and make sure that list is up to date. Please advise me when that is done and I will print out and email to pt's son. He was also advise to inform [facility] that we are in [place] and a lot of information should be available to them thru that system. Son, Hart Carwin is available at [phone].

## 2018-07-21 NOTE — Telephone Encounter (Signed)
Not sure why there is confusion--should look at my last note.  He deifnitely ran out of some medications because he was past due in seeing cardiologist.  He should be taking amlodipine, atorvastatin, lisinopril, carvedilol, aricept, fish oil and uloric (as listed in prior notes). He was started on psych meds, and I don't know what those are.  I agree that [facility] should be able to access our records to see them.  Let Merrilee Seashore know that I have been kept updated, and am aware that he is at [facility].  I hope his mom is feeling better, and I am so glad that his dad is finally getting help!

## 2018-07-28 DIAGNOSIS — S0993XA Unspecified injury of face, initial encounter: Secondary | ICD-10-CM | POA: Diagnosis not present

## 2018-07-30 DIAGNOSIS — Z743 Need for continuous supervision: Secondary | ICD-10-CM | POA: Diagnosis not present

## 2018-07-30 DIAGNOSIS — R279 Unspecified lack of coordination: Secondary | ICD-10-CM | POA: Diagnosis not present

## 2018-07-30 DIAGNOSIS — R5381 Other malaise: Secondary | ICD-10-CM | POA: Diagnosis not present

## 2018-08-12 ENCOUNTER — Other Ambulatory Visit: Payer: Self-pay | Admitting: Family Medicine

## 2018-08-18 DIAGNOSIS — I2510 Atherosclerotic heart disease of native coronary artery without angina pectoris: Secondary | ICD-10-CM | POA: Diagnosis not present

## 2018-08-18 DIAGNOSIS — I119 Hypertensive heart disease without heart failure: Secondary | ICD-10-CM | POA: Diagnosis not present

## 2018-10-29 ENCOUNTER — Encounter: Payer: Medicare Other | Admitting: Family Medicine

## 2018-12-09 DIAGNOSIS — R451 Restlessness and agitation: Secondary | ICD-10-CM | POA: Diagnosis not present

## 2018-12-09 DIAGNOSIS — R9431 Abnormal electrocardiogram [ECG] [EKG]: Secondary | ICD-10-CM | POA: Diagnosis not present

## 2018-12-09 DIAGNOSIS — I10 Essential (primary) hypertension: Secondary | ICD-10-CM | POA: Diagnosis not present

## 2018-12-12 DIAGNOSIS — I2510 Atherosclerotic heart disease of native coronary artery without angina pectoris: Secondary | ICD-10-CM | POA: Diagnosis not present

## 2019-02-06 DIAGNOSIS — D6869 Other thrombophilia: Secondary | ICD-10-CM | POA: Diagnosis not present

## 2019-02-06 DIAGNOSIS — G3184 Mild cognitive impairment, so stated: Secondary | ICD-10-CM | POA: Diagnosis not present

## 2019-02-06 DIAGNOSIS — I2510 Atherosclerotic heart disease of native coronary artery without angina pectoris: Secondary | ICD-10-CM | POA: Diagnosis not present

## 2019-02-06 DIAGNOSIS — I10 Essential (primary) hypertension: Secondary | ICD-10-CM | POA: Diagnosis not present

## 2019-03-01 DIAGNOSIS — G3184 Mild cognitive impairment, so stated: Secondary | ICD-10-CM | POA: Diagnosis not present

## 2019-03-01 DIAGNOSIS — D6869 Other thrombophilia: Secondary | ICD-10-CM | POA: Diagnosis not present

## 2019-03-01 DIAGNOSIS — I2510 Atherosclerotic heart disease of native coronary artery without angina pectoris: Secondary | ICD-10-CM | POA: Diagnosis not present

## 2019-03-01 DIAGNOSIS — R600 Localized edema: Secondary | ICD-10-CM | POA: Diagnosis not present

## 2019-03-01 DIAGNOSIS — G4700 Insomnia, unspecified: Secondary | ICD-10-CM | POA: Diagnosis not present

## 2019-05-05 DIAGNOSIS — G3184 Mild cognitive impairment, so stated: Secondary | ICD-10-CM | POA: Diagnosis not present

## 2019-05-05 DIAGNOSIS — I2510 Atherosclerotic heart disease of native coronary artery without angina pectoris: Secondary | ICD-10-CM | POA: Diagnosis not present

## 2019-05-05 DIAGNOSIS — I10 Essential (primary) hypertension: Secondary | ICD-10-CM | POA: Diagnosis not present

## 2019-05-05 DIAGNOSIS — E785 Hyperlipidemia, unspecified: Secondary | ICD-10-CM | POA: Diagnosis not present

## 2019-05-07 DIAGNOSIS — E785 Hyperlipidemia, unspecified: Secondary | ICD-10-CM | POA: Diagnosis not present

## 2019-05-07 DIAGNOSIS — R2241 Localized swelling, mass and lump, right lower limb: Secondary | ICD-10-CM | POA: Diagnosis not present

## 2019-06-05 DIAGNOSIS — R600 Localized edema: Secondary | ICD-10-CM | POA: Diagnosis not present

## 2019-06-05 DIAGNOSIS — I119 Hypertensive heart disease without heart failure: Secondary | ICD-10-CM | POA: Diagnosis not present

## 2019-06-05 DIAGNOSIS — E785 Hyperlipidemia, unspecified: Secondary | ICD-10-CM | POA: Diagnosis not present

## 2019-06-05 DIAGNOSIS — G3184 Mild cognitive impairment, so stated: Secondary | ICD-10-CM | POA: Diagnosis not present

## 2019-07-09 DIAGNOSIS — G3184 Mild cognitive impairment, so stated: Secondary | ICD-10-CM | POA: Diagnosis not present

## 2019-07-09 DIAGNOSIS — I2510 Atherosclerotic heart disease of native coronary artery without angina pectoris: Secondary | ICD-10-CM | POA: Diagnosis not present

## 2019-07-09 DIAGNOSIS — I119 Hypertensive heart disease without heart failure: Secondary | ICD-10-CM | POA: Diagnosis not present

## 2019-07-09 DIAGNOSIS — E785 Hyperlipidemia, unspecified: Secondary | ICD-10-CM | POA: Diagnosis not present

## 2019-07-09 DIAGNOSIS — D6869 Other thrombophilia: Secondary | ICD-10-CM | POA: Diagnosis not present

## 2019-07-21 DIAGNOSIS — L853 Xerosis cutis: Secondary | ICD-10-CM | POA: Diagnosis not present

## 2019-07-21 DIAGNOSIS — M79675 Pain in left toe(s): Secondary | ICD-10-CM | POA: Diagnosis not present

## 2019-07-21 DIAGNOSIS — B351 Tinea unguium: Secondary | ICD-10-CM | POA: Diagnosis not present

## 2019-08-07 DIAGNOSIS — I2510 Atherosclerotic heart disease of native coronary artery without angina pectoris: Secondary | ICD-10-CM | POA: Diagnosis not present

## 2019-08-07 DIAGNOSIS — I119 Hypertensive heart disease without heart failure: Secondary | ICD-10-CM | POA: Diagnosis not present

## 2019-08-07 DIAGNOSIS — D6869 Other thrombophilia: Secondary | ICD-10-CM | POA: Diagnosis not present

## 2019-08-07 DIAGNOSIS — R600 Localized edema: Secondary | ICD-10-CM | POA: Diagnosis not present

## 2019-08-07 DIAGNOSIS — G3184 Mild cognitive impairment, so stated: Secondary | ICD-10-CM | POA: Diagnosis not present

## 2019-09-08 DIAGNOSIS — G3184 Mild cognitive impairment, so stated: Secondary | ICD-10-CM | POA: Diagnosis not present

## 2019-09-08 DIAGNOSIS — G4700 Insomnia, unspecified: Secondary | ICD-10-CM | POA: Diagnosis not present

## 2019-09-08 DIAGNOSIS — I2510 Atherosclerotic heart disease of native coronary artery without angina pectoris: Secondary | ICD-10-CM | POA: Diagnosis not present

## 2019-09-08 DIAGNOSIS — D6869 Other thrombophilia: Secondary | ICD-10-CM | POA: Diagnosis not present

## 2019-09-22 DIAGNOSIS — D2372 Other benign neoplasm of skin of left lower limb, including hip: Secondary | ICD-10-CM | POA: Diagnosis not present

## 2019-09-22 DIAGNOSIS — L853 Xerosis cutis: Secondary | ICD-10-CM | POA: Diagnosis not present

## 2019-09-22 DIAGNOSIS — D2371 Other benign neoplasm of skin of right lower limb, including hip: Secondary | ICD-10-CM | POA: Diagnosis not present

## 2019-09-22 DIAGNOSIS — R208 Other disturbances of skin sensation: Secondary | ICD-10-CM | POA: Diagnosis not present

## 2019-10-09 DIAGNOSIS — G3184 Mild cognitive impairment, so stated: Secondary | ICD-10-CM | POA: Diagnosis not present

## 2019-10-09 DIAGNOSIS — G4700 Insomnia, unspecified: Secondary | ICD-10-CM | POA: Diagnosis not present

## 2019-10-09 DIAGNOSIS — D6869 Other thrombophilia: Secondary | ICD-10-CM | POA: Diagnosis not present

## 2019-10-09 DIAGNOSIS — I2510 Atherosclerotic heart disease of native coronary artery without angina pectoris: Secondary | ICD-10-CM | POA: Diagnosis not present

## 2019-10-09 DIAGNOSIS — R600 Localized edema: Secondary | ICD-10-CM | POA: Diagnosis not present

## 2019-11-26 DIAGNOSIS — I119 Hypertensive heart disease without heart failure: Secondary | ICD-10-CM | POA: Diagnosis not present

## 2019-11-26 DIAGNOSIS — D6869 Other thrombophilia: Secondary | ICD-10-CM | POA: Diagnosis not present

## 2019-11-26 DIAGNOSIS — I2510 Atherosclerotic heart disease of native coronary artery without angina pectoris: Secondary | ICD-10-CM | POA: Diagnosis not present

## 2019-11-26 DIAGNOSIS — G4700 Insomnia, unspecified: Secondary | ICD-10-CM | POA: Diagnosis not present

## 2020-01-06 DIAGNOSIS — I119 Hypertensive heart disease without heart failure: Secondary | ICD-10-CM | POA: Diagnosis not present

## 2020-01-06 DIAGNOSIS — G3184 Mild cognitive impairment, so stated: Secondary | ICD-10-CM | POA: Diagnosis not present

## 2020-01-06 DIAGNOSIS — E785 Hyperlipidemia, unspecified: Secondary | ICD-10-CM | POA: Diagnosis not present

## 2020-01-06 DIAGNOSIS — I2510 Atherosclerotic heart disease of native coronary artery without angina pectoris: Secondary | ICD-10-CM | POA: Diagnosis not present

## 2020-01-06 DIAGNOSIS — D6869 Other thrombophilia: Secondary | ICD-10-CM | POA: Diagnosis not present

## 2020-02-05 DIAGNOSIS — D6869 Other thrombophilia: Secondary | ICD-10-CM | POA: Diagnosis not present

## 2020-02-05 DIAGNOSIS — E785 Hyperlipidemia, unspecified: Secondary | ICD-10-CM | POA: Diagnosis not present

## 2020-02-05 DIAGNOSIS — I2510 Atherosclerotic heart disease of native coronary artery without angina pectoris: Secondary | ICD-10-CM | POA: Diagnosis not present

## 2020-02-05 DIAGNOSIS — G3184 Mild cognitive impairment, so stated: Secondary | ICD-10-CM | POA: Diagnosis not present

## 2020-02-05 DIAGNOSIS — I119 Hypertensive heart disease without heart failure: Secondary | ICD-10-CM | POA: Diagnosis not present

## 2020-02-23 DIAGNOSIS — B351 Tinea unguium: Secondary | ICD-10-CM | POA: Diagnosis not present

## 2020-02-23 DIAGNOSIS — D2371 Other benign neoplasm of skin of right lower limb, including hip: Secondary | ICD-10-CM | POA: Diagnosis not present

## 2020-02-23 DIAGNOSIS — R208 Other disturbances of skin sensation: Secondary | ICD-10-CM | POA: Diagnosis not present

## 2020-02-23 DIAGNOSIS — D2372 Other benign neoplasm of skin of left lower limb, including hip: Secondary | ICD-10-CM | POA: Diagnosis not present

## 2020-02-23 DIAGNOSIS — R600 Localized edema: Secondary | ICD-10-CM | POA: Diagnosis not present

## 2020-02-29 DIAGNOSIS — E785 Hyperlipidemia, unspecified: Secondary | ICD-10-CM | POA: Diagnosis not present

## 2020-02-29 DIAGNOSIS — D6869 Other thrombophilia: Secondary | ICD-10-CM | POA: Diagnosis not present

## 2020-02-29 DIAGNOSIS — I2510 Atherosclerotic heart disease of native coronary artery without angina pectoris: Secondary | ICD-10-CM | POA: Diagnosis not present

## 2020-02-29 DIAGNOSIS — I119 Hypertensive heart disease without heart failure: Secondary | ICD-10-CM | POA: Diagnosis not present

## 2020-04-06 DIAGNOSIS — D6869 Other thrombophilia: Secondary | ICD-10-CM | POA: Diagnosis not present

## 2020-04-06 DIAGNOSIS — I2510 Atherosclerotic heart disease of native coronary artery without angina pectoris: Secondary | ICD-10-CM | POA: Diagnosis not present

## 2020-04-06 DIAGNOSIS — E785 Hyperlipidemia, unspecified: Secondary | ICD-10-CM | POA: Diagnosis not present

## 2020-04-06 DIAGNOSIS — G3184 Mild cognitive impairment, so stated: Secondary | ICD-10-CM | POA: Diagnosis not present

## 2020-05-05 DIAGNOSIS — I2510 Atherosclerotic heart disease of native coronary artery without angina pectoris: Secondary | ICD-10-CM | POA: Diagnosis not present

## 2020-05-05 DIAGNOSIS — I119 Hypertensive heart disease without heart failure: Secondary | ICD-10-CM | POA: Diagnosis not present

## 2020-05-05 DIAGNOSIS — D6869 Other thrombophilia: Secondary | ICD-10-CM | POA: Diagnosis not present

## 2020-05-05 DIAGNOSIS — E785 Hyperlipidemia, unspecified: Secondary | ICD-10-CM | POA: Diagnosis not present

## 2020-06-06 DIAGNOSIS — I119 Hypertensive heart disease without heart failure: Secondary | ICD-10-CM | POA: Diagnosis not present

## 2020-06-06 DIAGNOSIS — G4700 Insomnia, unspecified: Secondary | ICD-10-CM | POA: Diagnosis not present

## 2020-06-06 DIAGNOSIS — D6869 Other thrombophilia: Secondary | ICD-10-CM | POA: Diagnosis not present

## 2020-06-06 DIAGNOSIS — I2510 Atherosclerotic heart disease of native coronary artery without angina pectoris: Secondary | ICD-10-CM | POA: Diagnosis not present

## 2020-06-06 DIAGNOSIS — E785 Hyperlipidemia, unspecified: Secondary | ICD-10-CM | POA: Diagnosis not present

## 2020-07-08 DIAGNOSIS — E785 Hyperlipidemia, unspecified: Secondary | ICD-10-CM | POA: Diagnosis not present

## 2020-07-08 DIAGNOSIS — I2510 Atherosclerotic heart disease of native coronary artery without angina pectoris: Secondary | ICD-10-CM | POA: Diagnosis not present

## 2020-07-08 DIAGNOSIS — D6869 Other thrombophilia: Secondary | ICD-10-CM | POA: Diagnosis not present

## 2020-07-08 DIAGNOSIS — G3184 Mild cognitive impairment, so stated: Secondary | ICD-10-CM | POA: Diagnosis not present

## 2020-07-08 DIAGNOSIS — I119 Hypertensive heart disease without heart failure: Secondary | ICD-10-CM | POA: Diagnosis not present

## 2020-08-08 DIAGNOSIS — G3184 Mild cognitive impairment, so stated: Secondary | ICD-10-CM | POA: Diagnosis not present

## 2020-08-08 DIAGNOSIS — E785 Hyperlipidemia, unspecified: Secondary | ICD-10-CM | POA: Diagnosis not present

## 2020-08-08 DIAGNOSIS — D6869 Other thrombophilia: Secondary | ICD-10-CM | POA: Diagnosis not present

## 2020-08-08 DIAGNOSIS — I2510 Atherosclerotic heart disease of native coronary artery without angina pectoris: Secondary | ICD-10-CM | POA: Diagnosis not present

## 2020-08-08 DIAGNOSIS — I119 Hypertensive heart disease without heart failure: Secondary | ICD-10-CM | POA: Diagnosis not present

## 2020-09-01 DIAGNOSIS — I119 Hypertensive heart disease without heart failure: Secondary | ICD-10-CM | POA: Diagnosis not present

## 2020-09-01 DIAGNOSIS — D6869 Other thrombophilia: Secondary | ICD-10-CM | POA: Diagnosis not present

## 2020-09-01 DIAGNOSIS — G3184 Mild cognitive impairment, so stated: Secondary | ICD-10-CM | POA: Diagnosis not present

## 2020-09-01 DIAGNOSIS — I2510 Atherosclerotic heart disease of native coronary artery without angina pectoris: Secondary | ICD-10-CM | POA: Diagnosis not present

## 2020-10-03 DIAGNOSIS — E785 Hyperlipidemia, unspecified: Secondary | ICD-10-CM | POA: Diagnosis not present

## 2020-10-03 DIAGNOSIS — G4700 Insomnia, unspecified: Secondary | ICD-10-CM | POA: Diagnosis not present

## 2020-10-03 DIAGNOSIS — I2510 Atherosclerotic heart disease of native coronary artery without angina pectoris: Secondary | ICD-10-CM | POA: Diagnosis not present

## 2020-10-03 DIAGNOSIS — D6869 Other thrombophilia: Secondary | ICD-10-CM | POA: Diagnosis not present

## 2020-10-03 DIAGNOSIS — I119 Hypertensive heart disease without heart failure: Secondary | ICD-10-CM | POA: Diagnosis not present

## 2020-10-23 DIAGNOSIS — R410 Disorientation, unspecified: Secondary | ICD-10-CM | POA: Diagnosis not present

## 2020-10-23 DIAGNOSIS — Z951 Presence of aortocoronary bypass graft: Secondary | ICD-10-CM | POA: Diagnosis not present

## 2020-10-23 DIAGNOSIS — J9811 Atelectasis: Secondary | ICD-10-CM | POA: Diagnosis not present

## 2020-11-08 DIAGNOSIS — D6869 Other thrombophilia: Secondary | ICD-10-CM | POA: Diagnosis not present

## 2020-11-08 DIAGNOSIS — I119 Hypertensive heart disease without heart failure: Secondary | ICD-10-CM | POA: Diagnosis not present

## 2020-11-08 DIAGNOSIS — G3184 Mild cognitive impairment, so stated: Secondary | ICD-10-CM | POA: Diagnosis not present

## 2020-11-08 DIAGNOSIS — I2510 Atherosclerotic heart disease of native coronary artery without angina pectoris: Secondary | ICD-10-CM | POA: Diagnosis not present

## 2020-12-08 DIAGNOSIS — E785 Hyperlipidemia, unspecified: Secondary | ICD-10-CM | POA: Diagnosis not present

## 2020-12-08 DIAGNOSIS — G4700 Insomnia, unspecified: Secondary | ICD-10-CM | POA: Diagnosis not present

## 2020-12-08 DIAGNOSIS — D6869 Other thrombophilia: Secondary | ICD-10-CM | POA: Diagnosis not present

## 2020-12-08 DIAGNOSIS — G3184 Mild cognitive impairment, so stated: Secondary | ICD-10-CM | POA: Diagnosis not present

## 2020-12-08 DIAGNOSIS — I2510 Atherosclerotic heart disease of native coronary artery without angina pectoris: Secondary | ICD-10-CM | POA: Diagnosis not present

## 2021-01-02 DIAGNOSIS — G309 Alzheimer's disease, unspecified: Secondary | ICD-10-CM | POA: Diagnosis not present

## 2021-01-02 DIAGNOSIS — D6869 Other thrombophilia: Secondary | ICD-10-CM | POA: Diagnosis not present

## 2021-01-02 DIAGNOSIS — G4700 Insomnia, unspecified: Secondary | ICD-10-CM | POA: Diagnosis not present

## 2021-01-02 DIAGNOSIS — I119 Hypertensive heart disease without heart failure: Secondary | ICD-10-CM | POA: Diagnosis not present

## 2021-01-02 DIAGNOSIS — E785 Hyperlipidemia, unspecified: Secondary | ICD-10-CM | POA: Diagnosis not present

## 2021-01-02 DIAGNOSIS — G3184 Mild cognitive impairment, so stated: Secondary | ICD-10-CM | POA: Diagnosis not present

## 2021-01-02 DIAGNOSIS — I2510 Atherosclerotic heart disease of native coronary artery without angina pectoris: Secondary | ICD-10-CM | POA: Diagnosis not present

## 2021-02-02 DIAGNOSIS — D6869 Other thrombophilia: Secondary | ICD-10-CM | POA: Diagnosis not present

## 2021-02-02 DIAGNOSIS — I119 Hypertensive heart disease without heart failure: Secondary | ICD-10-CM | POA: Diagnosis not present

## 2021-02-02 DIAGNOSIS — I2510 Atherosclerotic heart disease of native coronary artery without angina pectoris: Secondary | ICD-10-CM | POA: Diagnosis not present

## 2021-02-02 DIAGNOSIS — E785 Hyperlipidemia, unspecified: Secondary | ICD-10-CM | POA: Diagnosis not present

## 2021-02-02 DIAGNOSIS — G4700 Insomnia, unspecified: Secondary | ICD-10-CM | POA: Diagnosis not present

## 2021-02-02 DIAGNOSIS — G3184 Mild cognitive impairment, so stated: Secondary | ICD-10-CM | POA: Diagnosis not present

## 2021-03-08 DIAGNOSIS — G4700 Insomnia, unspecified: Secondary | ICD-10-CM | POA: Diagnosis not present

## 2021-03-08 DIAGNOSIS — I119 Hypertensive heart disease without heart failure: Secondary | ICD-10-CM | POA: Diagnosis not present

## 2021-03-08 DIAGNOSIS — G3184 Mild cognitive impairment, so stated: Secondary | ICD-10-CM | POA: Diagnosis not present

## 2021-03-08 DIAGNOSIS — D6869 Other thrombophilia: Secondary | ICD-10-CM | POA: Diagnosis not present

## 2021-03-08 DIAGNOSIS — E785 Hyperlipidemia, unspecified: Secondary | ICD-10-CM | POA: Diagnosis not present

## 2021-03-08 DIAGNOSIS — I2510 Atherosclerotic heart disease of native coronary artery without angina pectoris: Secondary | ICD-10-CM | POA: Diagnosis not present

## 2021-04-04 DIAGNOSIS — D6869 Other thrombophilia: Secondary | ICD-10-CM | POA: Diagnosis not present

## 2021-04-04 DIAGNOSIS — G4700 Insomnia, unspecified: Secondary | ICD-10-CM | POA: Diagnosis not present

## 2021-04-04 DIAGNOSIS — I2510 Atherosclerotic heart disease of native coronary artery without angina pectoris: Secondary | ICD-10-CM | POA: Diagnosis not present

## 2021-04-04 DIAGNOSIS — I119 Hypertensive heart disease without heart failure: Secondary | ICD-10-CM | POA: Diagnosis not present

## 2021-04-04 DIAGNOSIS — E785 Hyperlipidemia, unspecified: Secondary | ICD-10-CM | POA: Diagnosis not present

## 2021-04-04 DIAGNOSIS — G3184 Mild cognitive impairment, so stated: Secondary | ICD-10-CM | POA: Diagnosis not present

## 2021-05-05 DIAGNOSIS — E785 Hyperlipidemia, unspecified: Secondary | ICD-10-CM | POA: Diagnosis not present

## 2021-05-05 DIAGNOSIS — G4700 Insomnia, unspecified: Secondary | ICD-10-CM | POA: Diagnosis not present

## 2021-05-05 DIAGNOSIS — D6869 Other thrombophilia: Secondary | ICD-10-CM | POA: Diagnosis not present

## 2021-05-05 DIAGNOSIS — I2510 Atherosclerotic heart disease of native coronary artery without angina pectoris: Secondary | ICD-10-CM | POA: Diagnosis not present

## 2021-05-05 DIAGNOSIS — G3184 Mild cognitive impairment, so stated: Secondary | ICD-10-CM | POA: Diagnosis not present

## 2021-05-05 DIAGNOSIS — I119 Hypertensive heart disease without heart failure: Secondary | ICD-10-CM | POA: Diagnosis not present
